# Patient Record
Sex: Male | Born: 1950 | Race: Black or African American | Hispanic: No | Smoking: Current every day smoker
Health system: Southern US, Community
[De-identification: ages and names within clinical notes are randomized; demographics above are authoritative.]

## PROBLEM LIST (undated history)

## (undated) DIAGNOSIS — E119 Type 2 diabetes mellitus without complications: Secondary | ICD-10-CM

## (undated) SURGERY — Surgical Case
Anesthesia: *Unknown

---

## 2003-12-12 ENCOUNTER — Ambulatory Visit (HOSPITAL_COMMUNITY): Admission: RE | Admit: 2003-12-12 | Discharge: 2003-12-12 | Payer: Self-pay | Admitting: Internal Medicine

## 2003-12-25 ENCOUNTER — Ambulatory Visit: Payer: Self-pay | Admitting: Internal Medicine

## 2003-12-25 ENCOUNTER — Ambulatory Visit: Payer: Self-pay | Admitting: *Deleted

## 2007-03-20 ENCOUNTER — Emergency Department (HOSPITAL_COMMUNITY): Admission: EM | Admit: 2007-03-20 | Discharge: 2007-03-20 | Payer: Self-pay | Admitting: Emergency Medicine

## 2008-04-06 IMAGING — CR DG CHEST 2V
2 series · 2 of 2 positions shown · non-contrast
Comparison: none

CLINICAL DATA: Preoperative respiratory exam for hand surgery.  Smoking history.
 CHEST - 2 VIEWS:
 No comparison.

[view not recorded (1 of 2)]
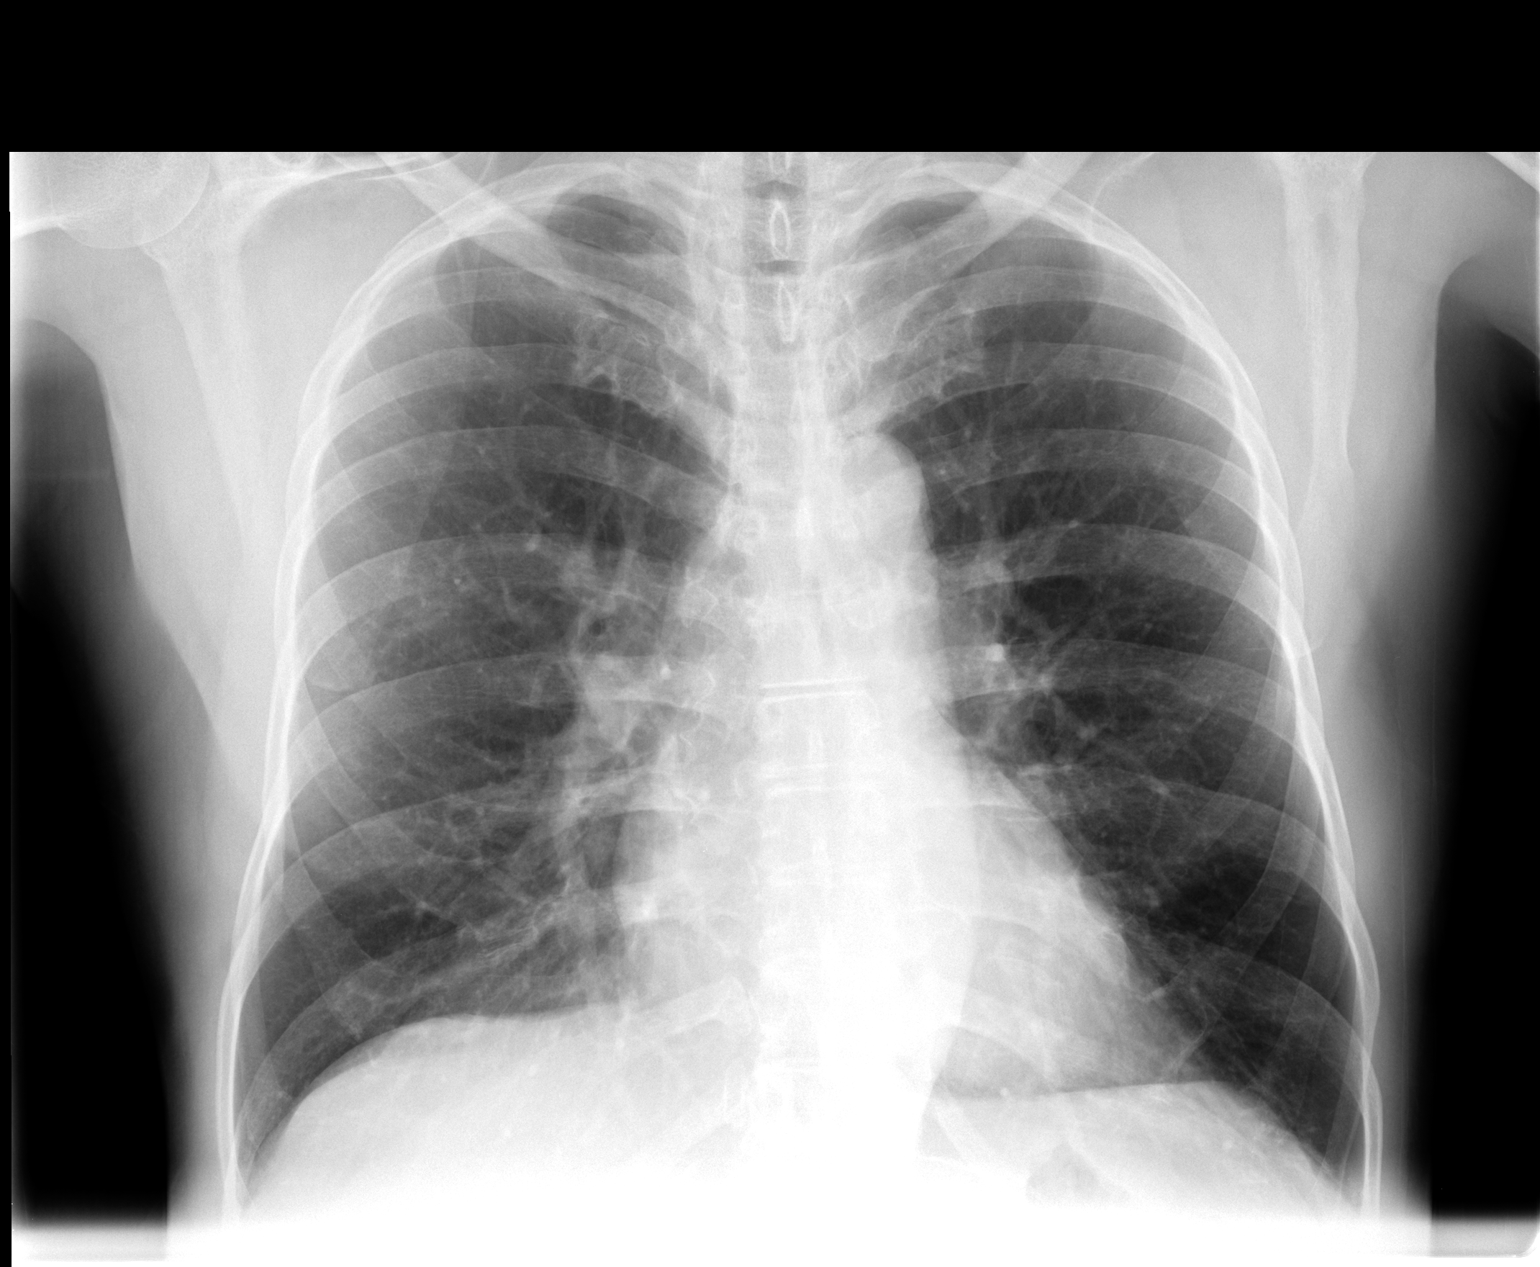

[view not recorded (2 of 2)]
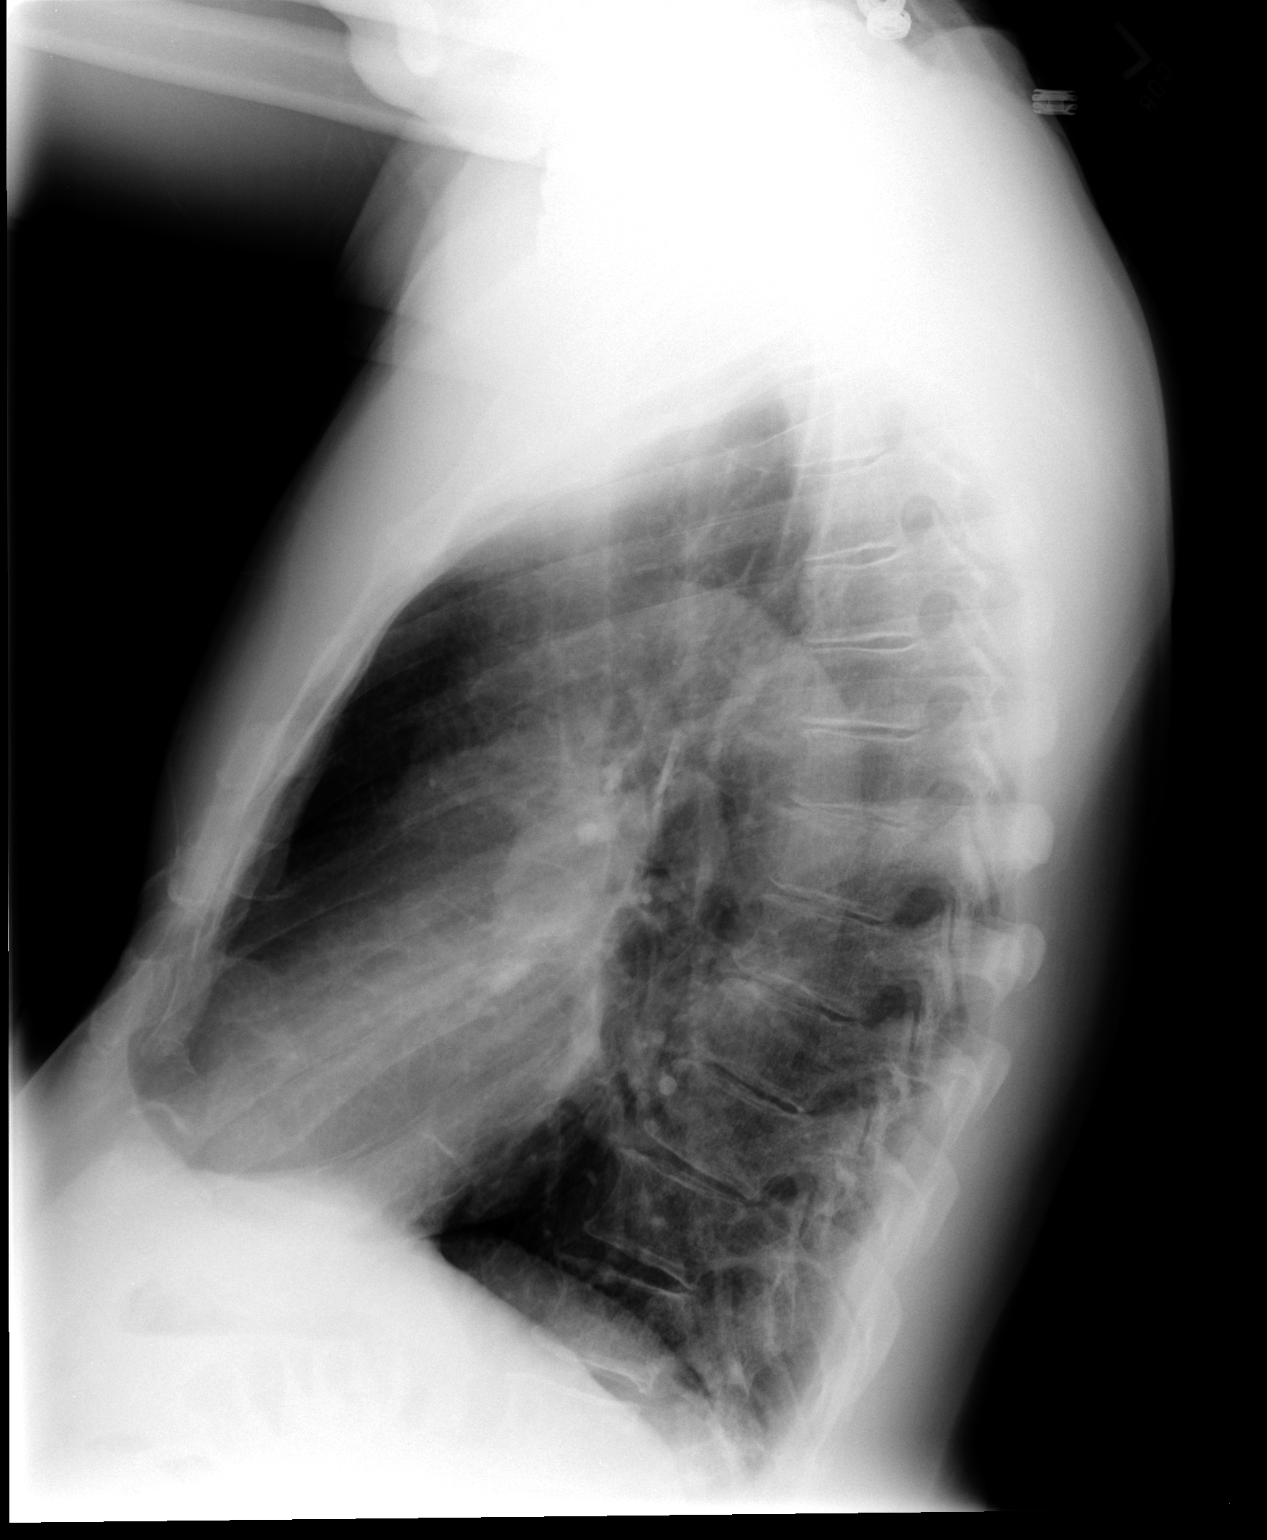

[2 of 2 positions shown; findings below may reference images not displayed]

FINDINGS: The heart size is normal.   The mediastinum is unremarkable.   The lungs show some slightly increased interstitial markings but no evidence of focal mass, infiltrate, collapse, or effusion.  Ordinary degenerative changes affect the spine.
IMPRESSION: No active disease.

## 2009-11-27 ENCOUNTER — Ambulatory Visit (HOSPITAL_COMMUNITY): Admission: RE | Admit: 2009-11-27 | Discharge: 2009-11-27 | Payer: Self-pay | Admitting: Cardiology

## 2010-09-01 NOTE — Consult Note (Signed)
Kevin Velez, Kevin Velez               ACCOUNT NO.:  [id number]   MEDICAL RECORD NO.:  [id number]          PATIENT TYPE:  INP   LOCATION:  1843                         FACILITY:  [facility]   PHYSICIAN:  Artist Pais. Mina Marble, M.D.DATE OF BIRTH:  11-Mar-1951   DATE OF CONSULTATION:  03/20/2007  DATE OF DISCHARGE:  03/20/2007                                 CONSULTATION   REFERRING PHYSICIAN:  Hilario Quarry, M.D.   REASON FOR CONSULTATION:  Kevin Velez is a 60 year old left-hand dominant  male, otherwise fairly healthy, who sustained a crush injury to his left  hand with obvious crush injuries to the long and small finger, dominant  left hand.  He is 56.  He has no known drug allergies.  He is not  diabetic.  He denies any current medical problems.  Denies any current  drug allergies or medications.  He does not smoke or drink excessively.   PHYSICAL EXAMINATION:  Examination reveals a well-nourished male,  pleasant, alert and oriented x3.  He has a significant crush injury to  the long ring finger with knee amputations.  He has gross contamination.  He has decreased sensation to the tips and has x-rays that show distal  phalangeal fractures.  He was transferred from an outside facility.   IMPRESSION:  A 60 year old male with significant crush injury to his  left long and left small fingers.  At this point in time we discussed  with him that we will take him to the operating room for irrigation and  debridement and repair as necessary.  He also understands that due to  the severe crush nature he may end up with amputations at one point in  the future if his soft tissue does not survive.  At this point in time  we will do our best to repair them and  maintain as much length as  possible. We will do this as soon as possible.      Artist Pais Mina Marble, M.D.  Electronically Signed     MAW/MEDQ  D:  03/20/2007  T:  03/21/2007  Job:  [id number]

## 2010-09-01 NOTE — Op Note (Signed)
Kevin Velez, Kevin Velez               ACCOUNT NO.:  [id number]   MEDICAL RECORD NO.:  [id number]          PATIENT TYPE:  INP   LOCATION:  1843                         FACILITY:  [facility]   PHYSICIAN:  Artist Pais. Weingold, M.D.DATE OF BIRTH:  11-12-1950   DATE OF PROCEDURE:  03/20/2007  DATE OF DISCHARGE:                               OPERATIVE REPORT   PREOPERATIVE DIAGNOSIS:  Severe crush injury left hand with open  injuries to the left long and left small finger.   POSTOPERATIVE DIAGNOSIS:  Severe crush injury left hand with open  injuries to the left long and left small finger.   PROCEDURE:  Irrigation debridement of above with operative treatment of  open distal phalangeal fracture and nailbed lacerations, left long and  left small.   SURGEON:  Artist Pais. Mina Marble, M.D.   ASSISTANT:  None.   ANESTHESIA:  General.   TOURNIQUET TIME:  40 minutes.   No complication.  No drains.   OPERATIVE REPORT:  The patient was taken to operating suite. After  induction of adequate general anesthesia, left upper extremity was  prepped and draped in sterile fashion.  An Esmarch was used to  exsanguinate the limb.  Tourniquet was inflated to 250 mm.  At this  point in time the left hand was irrigated and debrided.  There was gross  contamination of the open injury to the long and small finger.  A  significant amount of nonviable material and blood were removed.  After  this was done, the soft tissues were carefully realigned using 4-0  Vicryl Rapide.  There was significant crush to the tips of the long and  small finger.  Vicryl Rapide was used to carefully try and reapproximate  the soft tissues to the distal phalanx.  After this was done, distal  phalanx was reduced on both the fingers, the nailbed was repaired with 6-  0 chromic followed by placement of the nail plate back under the  eponychial fold in both situations.  Intraoperative fluoroscopy revealed  reduction of the fractures.  Due  to the questionable soft tissue  viability, decision was made not to use K-wires across the fracture  sites as the fractures were stable.  At this point time the fingers  dressed with Xeroform, 4x4s and volar splint.  The patient tolerated  both procedures well, went recovery in stable fashion.      Artist Pais Mina Marble, M.D.  Electronically Signed    MAW/MEDQ  D:  03/20/2007  T:  03/21/2007  Job:  [id number]

## 2011-01-25 LAB — I-STAT 8, (EC8 V) (CONVERTED LAB)
Acid-Base Excess: 5 — ABNORMAL HIGH
Bicarbonate: 29.3 — ABNORMAL HIGH
Chloride: 106
HCT: 50
Hemoglobin: 17
pCO2, Ven: 42 — ABNORMAL LOW

## 2019-11-13 ENCOUNTER — Ambulatory Visit (HOSPITAL_COMMUNITY)
Admission: EM | Admit: 2019-11-13 | Discharge: 2019-11-13 | Disposition: A | Discharge status: Home / Self Care | Payer: Medicare Other | Attending: Family Medicine | Admitting: Family Medicine

## 2019-11-13 ENCOUNTER — Other Ambulatory Visit: Payer: Self-pay

## 2019-11-13 ENCOUNTER — Encounter (HOSPITAL_COMMUNITY): Payer: Self-pay

## 2019-11-13 DIAGNOSIS — B029 Zoster without complications: Secondary | ICD-10-CM

## 2019-11-13 MED ORDER — CAPSAICIN 0.075 % EX CREA: 1.0000 "application " | TOPICAL_CREAM | Freq: Two times a day (BID) | CUTANEOUS | 0 refills | Status: DC | PRN

## 2019-11-13 MED ORDER — VALACYCLOVIR HCL 1 G PO TABS: 1000.0000 mg | ORAL_TABLET | Freq: Three times a day (TID) | ORAL | 0 refills | Status: DC

## 2019-11-13 MED ORDER — HYDROCODONE-ACETAMINOPHEN 5-325 MG PO TABS: 1.0000 | ORAL_TABLET | Freq: Four times a day (QID) | ORAL | 0 refills | Status: DC | PRN

## 2019-11-13 NOTE — ED Triage Notes (Signed)
Pt presents to UC for possible shingles. Pt noted to have rash on left ribcage. Pt states rash is painful and burns. Pt noted to have open sores on rash. Pt has been treating with hydrocortisone cream with out relief. Of note pt got shingles vaccine on Friday, rash came on Wednesday.

## 2019-11-13 NOTE — ED Provider Notes (Signed)
[facility]   [id number] 11/13/19 Arrival Time: 5726  ASSESSMENT & PLAN:  1. Herpes zoster without complication     Meds ordered this encounter  Medications   valACYclovir (VALTREX) 1000 MG tablet    Sig: Take 1 tablet (1,000 mg total) by mouth 3 (three) times daily.    Dispense:  21 tablet    Refill:  0   capsicum (ZOSTRIX) 0.075 % topical cream    Sig: Apply 1 application topically 2 (two) times daily as needed.    Dispense:  28.3 g    Refill:  0   HYDROcodone-acetaminophen (NORCO/VICODIN) 5-325 MG tablet    Sig: Take 1 tablet by mouth every 6 (six) hours as needed for moderate pain or severe pain.    Dispense:  10 tablet    Refill:  0    No signs of infection.  [place] Controlled Substances Registry consulted for this patient. I feel the risk/benefit ratio today is favorable for proceeding with this prescription for a controlled substance. Medication sedation precautions given.   Will follow up with PCP or here if worsening or failing to improve as anticipated. Reviewed expectations re: course of current medical issues. Questions answered. Outlined signs and symptoms indicating need for more acute intervention. Patient verbalized understanding. After Visit Summary given.   SUBJECTIVE:  Kevin Velez is a 69 y.o. male who presents with a skin complaint. Questions shingles. L chest; several days. Painful; affecting sleep. Afebrile. OTC tx without relief. Has received first shingles vaccine one week prior to current rash.   OBJECTIVE: Vitals:   11/13/19 0856  BP: (!) 142/88  Pulse: 89  Resp: 16  Temp: 98.1 F (36.7 C)  TempSrc: Oral  SpO2: 97%    General appearance: alert; no distress HEENT: [place]; AT Neck: supple with FROM Lungs: clear to auscultation bilaterally Heart: regular rate and rhythm Extremities: no edema; moves all extremities normally Skin: warm and dry; crops of red/purplish papules over L side and chest wall; some  crusting Psychological: alert and cooperative; normal mood and affect  Not on File  History reviewed. No pertinent past medical history. Social History   Socioeconomic History   Marital status: Divorced    Spouse name: Not on file   Number of children: Not on file   Years of education: Not on file   Highest education level: Not on file  Occupational History   Not on file  Tobacco Use   Smoking status: Current Every Day Smoker   Smokeless tobacco: Never Used  Substance and Sexual Activity   Alcohol use: Not on file   Drug use: Not on file   Sexual activity: Not on file  Other Topics Concern   Not on file  Social History Narrative   Not on file   Social Determinants of Health   Financial Resource Strain:    Difficulty of Paying Living Expenses:   Food Insecurity:    Worried About Running Out of Food in the Last Year:    Barista in the Last Year:   Transportation Needs:    Freight forwarder (Medical):    Lack of Transportation (Non-Medical):   Physical Activity:    Days of Exercise per Week:    Minutes of Exercise per Session:   Stress:    Feeling of Stress :   Social Connections:    Frequency of Communication with Friends and Family:    Frequency of Social Gatherings with Friends and Family:  Attends Religious Services:    Active Member of Clubs or Organizations:    Attends Engineer, structural:    Marital Status:   Intimate Partner Violence:    Fear of Current or Ex-Partner:    Emotionally Abused:    Physically Abused:    Sexually Abused:    History reviewed. No pertinent family history. History reviewed. No pertinent surgical history.   Mardella Layman, MD 11/13/19 [phone]

## 2019-11-13 NOTE — Discharge Instructions (Addendum)

## 2022-11-30 ENCOUNTER — Other Ambulatory Visit: Payer: Self-pay | Admitting: Nurse Practitioner

## 2022-11-30 DIAGNOSIS — F1021 Alcohol dependence, in remission: Secondary | ICD-10-CM

## 2022-12-22 ENCOUNTER — Other Ambulatory Visit: Payer: 59

## 2023-01-11 ENCOUNTER — Other Ambulatory Visit: Payer: 59

## 2023-01-24 ENCOUNTER — Other Ambulatory Visit: Payer: 59

## 2023-08-08 ENCOUNTER — Inpatient Hospital Stay (HOSPITAL_COMMUNITY)

## 2023-08-08 ENCOUNTER — Inpatient Hospital Stay (HOSPITAL_COMMUNITY)
Admission: EM | Admit: 2023-08-08 | Discharge: 2023-08-20 | DRG: 871 | Disposition: A | Discharge status: Skilled Nursing Facility | Attending: Internal Medicine | Admitting: Internal Medicine

## 2023-08-08 ENCOUNTER — Emergency Department (HOSPITAL_COMMUNITY)

## 2023-08-08 ENCOUNTER — Other Ambulatory Visit: Payer: Self-pay

## 2023-08-08 DIAGNOSIS — D539 Nutritional anemia, unspecified: Secondary | ICD-10-CM | POA: Diagnosis present

## 2023-08-08 DIAGNOSIS — K922 Gastrointestinal hemorrhage, unspecified: Secondary | ICD-10-CM

## 2023-08-08 DIAGNOSIS — A419 Sepsis, unspecified organism: Secondary | ICD-10-CM | POA: Diagnosis present

## 2023-08-08 DIAGNOSIS — J9690 Respiratory failure, unspecified, unspecified whether with hypoxia or hypercapnia: Secondary | ICD-10-CM | POA: Diagnosis present

## 2023-08-08 DIAGNOSIS — I959 Hypotension, unspecified: Secondary | ICD-10-CM | POA: Diagnosis not present

## 2023-08-08 DIAGNOSIS — D684 Acquired coagulation factor deficiency: Secondary | ICD-10-CM | POA: Diagnosis present

## 2023-08-08 DIAGNOSIS — E860 Dehydration: Secondary | ICD-10-CM | POA: Diagnosis present

## 2023-08-08 DIAGNOSIS — K449 Diaphragmatic hernia without obstruction or gangrene: Secondary | ICD-10-CM | POA: Diagnosis present

## 2023-08-08 DIAGNOSIS — E874 Mixed disorder of acid-base balance: Secondary | ICD-10-CM | POA: Diagnosis present

## 2023-08-08 DIAGNOSIS — K7291 Hepatic failure, unspecified with coma: Secondary | ICD-10-CM | POA: Diagnosis not present

## 2023-08-08 DIAGNOSIS — K7211 Chronic hepatic failure with coma: Secondary | ICD-10-CM | POA: Diagnosis present

## 2023-08-08 DIAGNOSIS — R339 Retention of urine, unspecified: Secondary | ICD-10-CM | POA: Diagnosis not present

## 2023-08-08 DIAGNOSIS — G9341 Metabolic encephalopathy: Secondary | ICD-10-CM | POA: Diagnosis present

## 2023-08-08 DIAGNOSIS — D649 Anemia, unspecified: Secondary | ICD-10-CM

## 2023-08-08 DIAGNOSIS — Z1152 Encounter for screening for COVID-19: Secondary | ICD-10-CM

## 2023-08-08 DIAGNOSIS — K7460 Unspecified cirrhosis of liver: Secondary | ICD-10-CM | POA: Diagnosis not present

## 2023-08-08 DIAGNOSIS — I8510 Secondary esophageal varices without bleeding: Secondary | ICD-10-CM | POA: Diagnosis present

## 2023-08-08 DIAGNOSIS — K7200 Acute and subacute hepatic failure without coma: Secondary | ICD-10-CM | POA: Diagnosis not present

## 2023-08-08 DIAGNOSIS — N179 Acute kidney failure, unspecified: Secondary | ICD-10-CM | POA: Diagnosis not present

## 2023-08-08 DIAGNOSIS — K7011 Alcoholic hepatitis with ascites: Secondary | ICD-10-CM | POA: Diagnosis present

## 2023-08-08 DIAGNOSIS — Z79899 Other long term (current) drug therapy: Secondary | ICD-10-CM

## 2023-08-08 DIAGNOSIS — K7682 Hepatic encephalopathy: Secondary | ICD-10-CM

## 2023-08-08 DIAGNOSIS — K7201 Acute and subacute hepatic failure with coma: Secondary | ICD-10-CM | POA: Diagnosis not present

## 2023-08-08 DIAGNOSIS — K852 Alcohol induced acute pancreatitis without necrosis or infection: Secondary | ICD-10-CM | POA: Diagnosis present

## 2023-08-08 DIAGNOSIS — Z781 Physical restraint status: Secondary | ICD-10-CM

## 2023-08-08 DIAGNOSIS — R578 Other shock: Secondary | ICD-10-CM | POA: Diagnosis not present

## 2023-08-08 DIAGNOSIS — K921 Melena: Secondary | ICD-10-CM | POA: Diagnosis not present

## 2023-08-08 DIAGNOSIS — K7031 Alcoholic cirrhosis of liver with ascites: Secondary | ICD-10-CM | POA: Diagnosis present

## 2023-08-08 DIAGNOSIS — D6959 Other secondary thrombocytopenia: Secondary | ICD-10-CM | POA: Diagnosis present

## 2023-08-08 DIAGNOSIS — K7290 Hepatic failure, unspecified without coma: Secondary | ICD-10-CM | POA: Diagnosis not present

## 2023-08-08 DIAGNOSIS — R579 Shock, unspecified: Secondary | ICD-10-CM | POA: Diagnosis not present

## 2023-08-08 DIAGNOSIS — Z681 Body mass index (BMI) 19 or less, adult: Secondary | ICD-10-CM | POA: Diagnosis not present

## 2023-08-08 DIAGNOSIS — R571 Hypovolemic shock: Secondary | ICD-10-CM | POA: Diagnosis not present

## 2023-08-08 DIAGNOSIS — R1310 Dysphagia, unspecified: Secondary | ICD-10-CM | POA: Diagnosis present

## 2023-08-08 DIAGNOSIS — F17200 Nicotine dependence, unspecified, uncomplicated: Secondary | ICD-10-CM | POA: Diagnosis present

## 2023-08-08 DIAGNOSIS — K264 Chronic or unspecified duodenal ulcer with hemorrhage: Secondary | ICD-10-CM | POA: Diagnosis present

## 2023-08-08 DIAGNOSIS — K760 Fatty (change of) liver, not elsewhere classified: Secondary | ICD-10-CM | POA: Diagnosis present

## 2023-08-08 DIAGNOSIS — E876 Hypokalemia: Secondary | ICD-10-CM | POA: Diagnosis present

## 2023-08-08 DIAGNOSIS — J90 Pleural effusion, not elsewhere classified: Secondary | ICD-10-CM | POA: Diagnosis present

## 2023-08-08 DIAGNOSIS — D62 Acute posthemorrhagic anemia: Secondary | ICD-10-CM | POA: Diagnosis present

## 2023-08-08 DIAGNOSIS — T68XXXA Hypothermia, initial encounter: Secondary | ICD-10-CM

## 2023-08-08 DIAGNOSIS — R6521 Severe sepsis with septic shock: Secondary | ICD-10-CM | POA: Diagnosis present

## 2023-08-08 DIAGNOSIS — R188 Other ascites: Secondary | ICD-10-CM | POA: Diagnosis not present

## 2023-08-08 DIAGNOSIS — I9589 Other hypotension: Secondary | ICD-10-CM | POA: Diagnosis not present

## 2023-08-08 DIAGNOSIS — E43 Unspecified severe protein-calorie malnutrition: Secondary | ICD-10-CM | POA: Diagnosis present

## 2023-08-08 HISTORY — DX: Type 2 diabetes mellitus without complications: E11.9

## 2023-08-08 LAB — I-STAT CG4 LACTIC ACID, ED
Lactic Acid, Venous: 7.2 mmol/L (ref 0.5–1.9)
Lactic Acid, Venous: 6.7 mmol/L (ref 0.5–1.9)

## 2023-08-08 LAB — RETICULOCYTES
Immature Retic Fract: 37.7 % — ABNORMAL HIGH (ref 2.3–15.9)
RBC.: 2.64 MIL/uL — ABNORMAL LOW (ref 4.22–5.81)
Retic Count, Absolute: 480.5 10*3/uL — ABNORMAL HIGH (ref 19.0–186.0)
Retic Ct Pct: 18.2 % — ABNORMAL HIGH (ref 0.4–3.1)

## 2023-08-08 LAB — BLOOD GAS, ARTERIAL
Acid-base deficit: 2.4 mmol/L — ABNORMAL HIGH (ref 0.0–2.0)
Acid-base deficit: 2.1 mmol/L — ABNORMAL HIGH (ref 0.0–2.0)
Bicarbonate: 16.9 mmol/L — ABNORMAL LOW (ref 20.0–28.0)
Bicarbonate: 19.5 mmol/L — ABNORMAL LOW (ref 20.0–28.0)
Drawn by: 270211
Drawn by: 56037
FIO2: 80 %
MECHVT: 560 mL
MECHVT: 560 mL
O2 Content: 100 L/min
O2 Saturation: 100 %
O2 Saturation: 99.9 %
PEEP: 5 cmH2O
PEEP: 5 cmH2O
Patient temperature: 36.4
Patient temperature: 36.7
RATE: 20 {breaths}/min
RATE: 14 {breaths}/min
pCO2 arterial: 18 mmHg — CL (ref 32–48)
pCO2 arterial: 25 mmHg — ABNORMAL LOW (ref 32–48)
pH, Arterial: 7.59 — ABNORMAL HIGH (ref 7.35–7.45)
pH, Arterial: 7.5 — ABNORMAL HIGH (ref 7.35–7.45)
pO2, Arterial: 194 mmHg — ABNORMAL HIGH (ref 83–108)
pO2, Arterial: 305 mmHg — ABNORMAL HIGH (ref 83–108)

## 2023-08-08 LAB — COMPREHENSIVE METABOLIC PANEL WITH GFR
ALT: 63 U/L — ABNORMAL HIGH (ref 0–44)
AST: 236 U/L — ABNORMAL HIGH (ref 15–41)
Albumin: 1.8 g/dL — ABNORMAL LOW (ref 3.5–5.0)
Alkaline Phosphatase: 67 U/L (ref 38–126)
Anion gap: 20 — ABNORMAL HIGH (ref 5–15)
BUN: 145 mg/dL — ABNORMAL HIGH (ref 8–23)
CO2: 16 mmol/L — ABNORMAL LOW (ref 22–32)
Calcium: 8.4 mg/dL — ABNORMAL LOW (ref 8.9–10.3)
Chloride: 99 mmol/L (ref 98–111)
Creatinine, Ser: 2.68 mg/dL — ABNORMAL HIGH (ref 0.61–1.24)
GFR, Estimated: 25 mL/min — ABNORMAL LOW (ref >60)
Glucose, Bld: 123 mg/dL — ABNORMAL HIGH (ref 70–99)
Potassium: 3.9 mmol/L (ref 3.5–5.1)
Sodium: 135 mmol/L (ref 135–145)
Total Bilirubin: 15.8 mg/dL — ABNORMAL HIGH (ref 0.0–1.2)
Total Protein: 6.6 g/dL (ref 6.5–8.1)

## 2023-08-08 LAB — CBC WITH DIFFERENTIAL/PLATELET
Abs Immature Granulocytes: 0.35 10*3/uL — ABNORMAL HIGH (ref 0.00–0.07)
Abs Immature Granulocytes: 0.25 10*3/uL — ABNORMAL HIGH (ref 0.00–0.07)
Basophils Absolute: 0 10*3/uL (ref 0.0–0.1)
Basophils Absolute: 0 10*3/uL (ref 0.0–0.1)
Basophils Relative: 0 %
Basophils Relative: 0 %
Eosinophils Absolute: 0.1 10*3/uL (ref 0.0–0.5)
Eosinophils Absolute: 0 10*3/uL (ref 0.0–0.5)
Eosinophils Relative: 0 %
Eosinophils Relative: 0 %
HCT: 9.7 % — ABNORMAL LOW (ref 39.0–52.0)
HCT: 9.7 % — ABNORMAL LOW (ref 39.0–52.0)
Hemoglobin: 3.3 g/dL — CL (ref 13.0–17.0)
Hemoglobin: 3.4 g/dL — CL (ref 13.0–17.0)
Immature Granulocytes: 2 %
Immature Granulocytes: 2 %
Lymphocytes Relative: 17 %
Lymphocytes Relative: 14 %
Lymphs Abs: 2.7 10*3/uL (ref 0.7–4.0)
Lymphs Abs: 1.9 10*3/uL (ref 0.7–4.0)
MCH: 37.1 pg — ABNORMAL HIGH (ref 26.0–34.0)
MCH: 37.8 pg — ABNORMAL HIGH (ref 26.0–34.0)
MCHC: 34 g/dL (ref 30.0–36.0)
MCHC: 35.1 g/dL (ref 30.0–36.0)
MCV: 109 fL — ABNORMAL HIGH (ref 80.0–100.0)
MCV: 107.8 fL — ABNORMAL HIGH (ref 80.0–100.0)
Monocytes Absolute: 1.2 10*3/uL — ABNORMAL HIGH (ref 0.1–1.0)
Monocytes Absolute: 0.9 10*3/uL (ref 0.1–1.0)
Monocytes Relative: 7 %
Monocytes Relative: 6 %
Neutro Abs: 11.9 10*3/uL — ABNORMAL HIGH (ref 1.7–7.7)
Neutro Abs: 10.7 10*3/uL — ABNORMAL HIGH (ref 1.7–7.7)
Neutrophils Relative %: 74 %
Neutrophils Relative %: 78 %
Platelets: 211 10*3/uL (ref 150–400)
Platelets: 136 10*3/uL — ABNORMAL LOW (ref 150–400)
RBC: 0.89 MIL/uL — ABNORMAL LOW (ref 4.22–5.81)
RBC: 0.9 MIL/uL — ABNORMAL LOW (ref 4.22–5.81)
RDW: 18.9 % — ABNORMAL HIGH (ref 11.5–15.5)
RDW: 18.3 % — ABNORMAL HIGH (ref 11.5–15.5)
WBC: 16.2 10*3/uL — ABNORMAL HIGH (ref 4.0–10.5)
WBC: 13.7 10*3/uL — ABNORMAL HIGH (ref 4.0–10.5)
nRBC: 0.9 % — ABNORMAL HIGH (ref 0.0–0.2)
nRBC: 0.7 % — ABNORMAL HIGH (ref 0.0–0.2)

## 2023-08-08 LAB — URINALYSIS, W/ REFLEX TO CULTURE (INFECTION SUSPECTED)
Glucose, UA: NEGATIVE mg/dL
Hgb urine dipstick: NEGATIVE
Ketones, ur: NEGATIVE mg/dL
Leukocytes,Ua: NEGATIVE
Nitrite: NEGATIVE
Protein, ur: NEGATIVE mg/dL
Specific Gravity, Urine: 1.015 (ref 1.005–1.030)
pH: 5 (ref 5.0–8.0)

## 2023-08-08 LAB — FOLATE: Folate: 4 ng/mL — ABNORMAL LOW (ref >5.9)

## 2023-08-08 LAB — URINALYSIS, ROUTINE W REFLEX MICROSCOPIC
Glucose, UA: NEGATIVE mg/dL
Ketones, ur: NEGATIVE mg/dL
Leukocytes,Ua: NEGATIVE
Nitrite: NEGATIVE
Protein, ur: NEGATIVE mg/dL
Specific Gravity, Urine: 1.014 (ref 1.005–1.030)
pH: 5 (ref 5.0–8.0)

## 2023-08-08 LAB — IRON AND TIBC: Iron: 80 ug/dL (ref 45–182)

## 2023-08-08 LAB — HEPATITIS PANEL, ACUTE
HCV Ab: NONREACTIVE
Hep A IgM: NONREACTIVE
Hep B C IgM: NONREACTIVE
Hepatitis B Surface Ag: NONREACTIVE

## 2023-08-08 LAB — RESP PANEL BY RT-PCR (RSV, FLU A&B, COVID)  RVPGX2
Influenza A by PCR: NEGATIVE
Influenza B by PCR: NEGATIVE
Resp Syncytial Virus by PCR: NEGATIVE
SARS Coronavirus 2 by RT PCR: NEGATIVE

## 2023-08-08 LAB — FERRITIN: Ferritin: 3526 ng/mL — ABNORMAL HIGH (ref 24–336)

## 2023-08-08 LAB — PREPARE RBC (CROSSMATCH)

## 2023-08-08 LAB — PROTIME-INR
INR: 3 — ABNORMAL HIGH (ref 0.8–1.2)
Prothrombin Time: 31.5 s — ABNORMAL HIGH (ref 11.4–15.2)

## 2023-08-08 LAB — VITAMIN B12: Vitamin B-12: 3700 pg/mL — ABNORMAL HIGH (ref 180–914)

## 2023-08-08 LAB — CK: Total CK: 14 U/L — ABNORMAL LOW (ref 49–397)

## 2023-08-08 LAB — AMMONIA: Ammonia: 122 umol/L — ABNORMAL HIGH (ref 9–35)

## 2023-08-08 LAB — ETHANOL: Alcohol, Ethyl (B): <10 mg/dL (ref <10)

## 2023-08-08 LAB — ABO/RH: ABO/RH(D): O POS

## 2023-08-08 LAB — MRSA NEXT GEN BY PCR, NASAL: MRSA by PCR Next Gen: NOT DETECTED

## 2023-08-08 LAB — HEPATITIS B SURFACE ANTIBODY,QUALITATIVE: Hep B S Ab: NONREACTIVE

## 2023-08-08 LAB — HEPATITIS A ANTIBODY, TOTAL: hep A Total Ab: REACTIVE — AB

## 2023-08-08 LAB — POC OCCULT BLOOD, ED: Fecal Occult Bld: POSITIVE — AB

## 2023-08-08 LAB — ACETAMINOPHEN LEVEL: Acetaminophen (Tylenol), Serum: <10 ug/mL — ABNORMAL LOW (ref 10–30)

## 2023-08-08 LAB — HEPATITIS B SURFACE ANTIGEN: Hepatitis B Surface Ag: NONREACTIVE

## 2023-08-08 LAB — HEPATITIS C ANTIBODY: HCV Ab: NONREACTIVE

## 2023-08-08 MED ORDER — FENTANYL CITRATE PF 50 MCG/ML IJ SOSY
25.0000 ug | PREFILLED_SYRINGE | INTRAMUSCULAR | Status: AC | PRN
  Administered 2023-08-08 (×3): 25 ug via INTRAVENOUS
  Filled 2023-08-08 (×2): qty 1

## 2023-08-08 MED ORDER — PIPERACILLIN-TAZOBACTAM 3.375 G IVPB
3.3750 g | Freq: Two times a day (BID) | INTRAVENOUS | Status: DC
  Administered 2023-08-09: 3.375 g via INTRAVENOUS
  Filled 2023-08-08: qty 50

## 2023-08-08 MED ORDER — INSULIN ASPART 100 UNIT/ML IJ SOLN
0.0000 [IU] | INTRAMUSCULAR | Status: DC
Start: 2023-08-08 — End: 2023-08-11
  Administered 2023-08-11: 1 [IU] via SUBCUTANEOUS
  Filled 2023-08-08: qty 0.06

## 2023-08-08 MED ORDER — FAMOTIDINE 20 MG PO TABS
20.0000 mg | ORAL_TABLET | Freq: Every day | ORAL | Status: DC
Start: 2023-08-08 — End: 2023-08-08

## 2023-08-08 MED ORDER — DEXTROSE 5 % IV SOLN
6.2500 mg/kg/h | INTRAVENOUS | Status: DC
  Filled 2023-08-08: qty 90

## 2023-08-08 MED ORDER — FENTANYL CITRATE PF 50 MCG/ML IJ SOSY
25.0000 ug | PREFILLED_SYRINGE | INTRAMUSCULAR | Status: DC | PRN
  Administered 2023-08-08: 100 ug via INTRAVENOUS
  Administered 2023-08-08: 25 ug via INTRAVENOUS
  Administered 2023-08-08: 50 ug via INTRAVENOUS
  Administered 2023-08-08: 25 ug via INTRAVENOUS
  Administered 2023-08-08 – 2023-08-09 (×3): 100 ug via INTRAVENOUS
  Administered 2023-08-09 (×4): 50 ug via INTRAVENOUS
  Administered 2023-08-09: 100 ug via INTRAVENOUS
  Administered 2023-08-09 (×3): 50 ug via INTRAVENOUS
  Administered 2023-08-10 (×2): 100 ug via INTRAVENOUS
  Filled 2023-08-08: qty 1
  Filled 2023-08-08: qty 2
  Filled 2023-08-08 (×2): qty 1
  Filled 2023-08-08 (×5): qty 2
  Filled 2023-08-08 (×3): qty 1
  Filled 2023-08-08: qty 2
  Filled 2023-08-08 (×3): qty 1
  Filled 2023-08-08: qty 2
  Filled 2023-08-08: qty 1

## 2023-08-08 MED ORDER — CEFEPIME HCL 2 G IV SOLR
2.0000 g | Freq: Once | INTRAVENOUS | Status: AC
  Administered 2023-08-08: 2 g via INTRAVENOUS
  Filled 2023-08-08: qty 12.5

## 2023-08-08 MED ORDER — DEXTROSE 5 % IV SOLN
6.2500 mg/kg/h | INTRAVENOUS | Status: DC
  Administered 2023-08-08: 6.25 mg/kg/h via INTRAVENOUS
  Filled 2023-08-08 (×2): qty 90

## 2023-08-08 MED ORDER — ETOMIDATE 2 MG/ML IV SOLN
INTRAVENOUS | Status: AC
  Filled 2023-08-08: qty 10

## 2023-08-08 MED ORDER — LACTULOSE 10 GM/15ML PO SOLN
30.0000 g | Freq: Once | ORAL | Status: AC
  Administered 2023-08-09: 30 g
  Filled 2023-08-08: qty 60

## 2023-08-08 MED ORDER — POLYETHYLENE GLYCOL 3350 17 G PO PACK: 17.0000 g | PACK | Freq: Every day | ORAL | Status: DC | PRN

## 2023-08-08 MED ORDER — LACTATED RINGERS IV SOLN: INTRAVENOUS | Status: AC

## 2023-08-08 MED ORDER — NOREPINEPHRINE 4 MG/250ML-% IV SOLN
INTRAVENOUS | Status: AC
  Administered 2023-08-09: 11 ug/min via INTRAVENOUS
  Filled 2023-08-08: qty 250

## 2023-08-08 MED ORDER — CHLORHEXIDINE GLUCONATE CLOTH 2 % EX PADS
6.0000 | MEDICATED_PAD | Freq: Every day | CUTANEOUS | Status: DC
  Administered 2023-08-08 – 2023-08-11 (×4): 6 via TOPICAL

## 2023-08-08 MED ORDER — DOCUSATE SODIUM 100 MG PO CAPS: 100.0000 mg | ORAL_CAPSULE | Freq: Two times a day (BID) | ORAL | Status: DC | PRN

## 2023-08-08 MED ORDER — DEXTROSE 5 % IV SOLN
12.5000 mg/kg/h | INTRAVENOUS | Status: DC
  Filled 2023-08-08: qty 90

## 2023-08-08 MED ORDER — VANCOMYCIN HCL IN DEXTROSE 1-5 GM/200ML-% IV SOLN
1000.0000 mg | Freq: Once | INTRAVENOUS | Status: AC
  Administered 2023-08-08: 1000 mg via INTRAVENOUS
  Filled 2023-08-08: qty 200

## 2023-08-08 MED ORDER — DEXTROSE 5 % IV SOLN
12.5000 mg/kg/h | INTRAVENOUS | Status: DC
  Administered 2023-08-08: 12.5 mg/kg/h via INTRAVENOUS
  Filled 2023-08-08: qty 90

## 2023-08-08 MED ORDER — ACETYLCYSTEINE LOAD VIA INFUSION
150.0000 mg/kg | Freq: Once | INTRAVENOUS | Status: AC
  Administered 2023-08-08: 6120 mg via INTRAVENOUS
  Filled 2023-08-08: qty 201

## 2023-08-08 MED ORDER — VITAMIN K1 10 MG/ML IJ SOLN
10.0000 mg | Freq: Once | INTRAVENOUS | Status: AC
  Administered 2023-08-08: 10 mg via INTRAVENOUS
  Filled 2023-08-08: qty 1

## 2023-08-08 MED ORDER — IPRATROPIUM-ALBUTEROL 0.5-2.5 (3) MG/3ML IN SOLN: 3.0000 mL | Freq: Four times a day (QID) | RESPIRATORY_TRACT | Status: DC | PRN

## 2023-08-08 MED ORDER — SODIUM BICARBONATE 8.4 % IV SOLN
50.0000 meq | Freq: Once | INTRAVENOUS | Status: AC
  Administered 2023-08-08: 50 meq via INTRAVENOUS
  Filled 2023-08-08: qty 50

## 2023-08-08 MED ORDER — METRONIDAZOLE 500 MG/100ML IV SOLN
500.0000 mg | Freq: Once | INTRAVENOUS | Status: AC
  Administered 2023-08-08: 500 mg via INTRAVENOUS
  Filled 2023-08-08: qty 100

## 2023-08-08 MED ORDER — LACTATED RINGERS IV BOLUS: 1000.0000 mL | Freq: Once | INTRAVENOUS | Status: DC

## 2023-08-08 MED ORDER — ETOMIDATE 2 MG/ML IV SOLN
INTRAVENOUS | Status: AC
  Administered 2023-08-08: 2 mg
  Filled 2023-08-08: qty 10

## 2023-08-08 MED ORDER — LACTATED RINGERS IV BOLUS (SEPSIS)
2000.0000 mL | Freq: Once | INTRAVENOUS | Status: AC
  Administered 2023-08-08: 2000 mL via INTRAVENOUS

## 2023-08-08 MED ORDER — PANTOPRAZOLE SODIUM 40 MG IV SOLR
40.0000 mg | Freq: Two times a day (BID) | INTRAVENOUS | Status: DC
  Administered 2023-08-08 – 2023-08-20 (×24): 40 mg via INTRAVENOUS
  Filled 2023-08-08 (×24): qty 10

## 2023-08-08 MED ORDER — SODIUM CHLORIDE 0.9% IV SOLUTION: Freq: Once | INTRAVENOUS | Status: AC

## 2023-08-08 MED ORDER — PANTOPRAZOLE SODIUM 40 MG IV SOLR
40.0000 mg | Freq: Once | INTRAVENOUS | Status: AC
  Administered 2023-08-08: 40 mg via INTRAVENOUS
  Filled 2023-08-08: qty 10

## 2023-08-08 MED ORDER — NOREPINEPHRINE 4 MG/250ML-% IV SOLN
0.0000 ug/min | INTRAVENOUS | Status: DC
  Administered 2023-08-08: 2 ug/min via INTRAVENOUS
  Administered 2023-08-09: 11 ug/min via INTRAVENOUS
  Administered 2023-08-09: 6 ug/min via INTRAVENOUS
  Administered 2023-08-10: 8 ug/min via INTRAVENOUS
  Administered 2023-08-10: 6 ug/min via INTRAVENOUS
  Administered 2023-08-11: 3 ug/min via INTRAVENOUS
  Filled 2023-08-08 (×6): qty 250

## 2023-08-08 MED ORDER — SODIUM CHLORIDE 0.9 % IV BOLUS
1000.0000 mL | Freq: Once | INTRAVENOUS | Status: AC
  Administered 2023-08-08: 1000 mL via INTRAVENOUS

## 2023-08-08 MED ORDER — LACTULOSE 10 GM/15ML PO SOLN
30.0000 g | Freq: Three times a day (TID) | ORAL | Status: DC
  Administered 2023-08-10 – 2023-08-16 (×10): 30 g via ORAL
  Filled 2023-08-08 (×13): qty 45

## 2023-08-08 MED ORDER — SUCCINYLCHOLINE CHLORIDE 200 MG/10ML IV SOSY
PREFILLED_SYRINGE | INTRAVENOUS | Status: AC
  Administered 2023-08-08: 100 mg
  Filled 2023-08-08: qty 10

## 2023-08-08 NOTE — ED Provider Notes (Addendum)
 [place] EMERGENCY DEPARTMENT AT [facility] Provider Note   CSN: [id number] Arrival date & time: 08/08/23  1317     History  Chief Complaint  Patient presents with   Altered Mental Status   Emesis    Kevin Velez is a 73 y.o. male.  Pt is a 73 yo male with no significant pmhx.  Pt has been living at a rooming house.  Someone there called EMS because pt has not been out of bed in several days.  He has not been eating/drinking and has been stooling/urinating on himself.  Pt's bp 70/40 for EMS, but has improved upon arrival here.  Pt is unable to give any hx.         Home Medications Prior to Admission medications   Medication Sig Start Date End Date Taking? Authorizing Provider  capsicum (ZOSTRIX) 0.075 % topical cream Apply 1 application topically 2 (two) times daily as needed. 11/13/19   Afton Albright, MD  HYDROcodone -acetaminophen  (NORCO/VICODIN) 5-325 MG tablet Take 1 tablet by mouth every 6 (six) hours as needed for moderate pain or severe pain. 11/13/19   Afton Albright, MD  valACYclovir  (VALTREX ) 1000 MG tablet Take 1 tablet (1,000 mg total) by mouth 3 (three) times daily. 11/13/19   Afton Albright, MD      Allergies    Patient has no allergy information on record.    Review of Systems   Review of Systems  Unable to perform ROS: Mental status change  All other systems reviewed and are negative.   Physical Exam Updated Vital Signs BP 109/67   Pulse 89   Temp (!) 95.6 F (35.3 C) (Rectal)   Resp (!) 23   Ht 5\' 9"  (1.753 m)   Wt 40.8 kg   SpO2 100%   BMI 13.29 kg/m  Physical Exam Vitals and nursing note reviewed. Exam conducted with a chaperone present.  Constitutional:      Appearance: He is underweight. He is ill-appearing.  HENT:     Head: Normocephalic and atraumatic.     Right Ear: External ear normal.     Left Ear: External ear normal.     Nose: Nose normal.     Mouth/Throat:     Mouth: Mucous membranes are dry.  Eyes:     General:  Scleral icterus present.     Extraocular Movements: Extraocular movements intact.     Pupils: Pupils are equal, round, and reactive to light.  Cardiovascular:     Rate and Rhythm: Normal rate and regular rhythm.     Pulses: Normal pulses.     Heart sounds: Normal heart sounds.  Pulmonary:     Effort: Pulmonary effort is normal.     Breath sounds: Normal breath sounds.  Abdominal:     General: Bowel sounds are normal.     Palpations: Abdomen is soft. There is fluid wave.  Genitourinary:    Rectum: Guaiac result positive.     Comments: Stool is black Musculoskeletal:        General: Normal range of motion.     Cervical back: Normal range of motion and neck supple.  Skin:    General: Skin is warm.     Capillary Refill: Capillary refill takes less than 2 seconds.  Neurological:     Mental Status: He is alert.     Comments: Pt is moving all 4 extremities, he is not oriented      ED Results / Procedures / Treatments   Labs (  all labs ordered are listed, but only abnormal results are displayed) Labs Reviewed  COMPREHENSIVE METABOLIC PANEL WITH GFR - Abnormal; Notable for the following components:      Result Value   CO2 16 (*)    Glucose, Bld 123 (*)    BUN 145 (*)    Creatinine, Ser 2.68 (*)    Calcium  8.4 (*)    Albumin  1.8 (*)    AST 236 (*)    ALT 63 (*)    Total Bilirubin 15.8 (*)    GFR, Estimated 25 (*)    Anion gap 20 (*)    All other components within normal limits  CBC WITH DIFFERENTIAL/PLATELET - Abnormal; Notable for the following components:   WBC 16.2 (*)    RBC 0.89 (*)    Hemoglobin 3.3 (*)    HCT 9.7 (*)    MCV 109.0 (*)    MCH 37.1 (*)    RDW 18.9 (*)    nRBC 0.9 (*)    Neutro Abs 11.9 (*)    Monocytes Absolute 1.2 (*)    Abs Immature Granulocytes 0.35 (*)    All other components within normal limits  PROTIME-INR - Abnormal; Notable for the following components:   Prothrombin Time 31.5 (*)    INR 3.0 (*)    All other components within normal  limits  URINALYSIS, W/ REFLEX TO CULTURE (INFECTION SUSPECTED) - Abnormal; Notable for the following components:   Color, Urine AMBER (*)    Bilirubin Urine SMALL (*)    Bacteria, UA RARE (*)    All other components within normal limits  AMMONIA - Abnormal; Notable for the following components:   Ammonia 122 (*)    All other components within normal limits  I-STAT CG4 LACTIC ACID, ED - Abnormal; Notable for the following components:   Lactic Acid, Venous 7.2 (*)    All other components within normal limits  POC OCCULT BLOOD, ED - Abnormal; Notable for the following components:   Fecal Occult Bld POSITIVE (*)    All other components within normal limits  RESP PANEL BY RT-PCR (RSV, FLU A&B, COVID)  RVPGX2  CULTURE, BLOOD (ROUTINE X 2)  CULTURE, BLOOD (ROUTINE X 2)  VITAMIN B12  FOLATE  IRON AND TIBC  FERRITIN  RETICULOCYTES  HEPATITIS PANEL, ACUTE  BLOOD GAS, ARTERIAL  CBC WITH DIFFERENTIAL/PLATELET  RETICULOCYTES  ACETAMINOPHEN  LEVEL  ETHANOL  I-STAT CG4 LACTIC ACID, ED  PREPARE RBC (CROSSMATCH)    EKG None  Radiology No results found.  Procedures Procedure Name: Intubation Date/Time: 08/08/2023 3:57 PM  Performed by: Sueellen Emery, MDPreoxygenation: Pre-oxygenation with 100% oxygen Induction Type: Rapid sequence Ventilation: Mask ventilation without difficulty Laryngoscope Size: Glidescope and 3 Tube size: 7.5 mm Number of attempts: 1 Placement Confirmation: ETT inserted through vocal cords under direct vision, Breath sounds checked- equal and bilateral and Positive ETCO2    Central Line  Date/Time: 08/08/2023 3:58 PM  Performed by: Sueellen Emery, MD Authorized by: Sueellen Emery, MD   Consent:    Consent obtained:  Emergent situation   Alternatives discussed:  No treatment Universal protocol:    Patient identity confirmed:  Arm band Pre-procedure details:    Indication(s): central venous access and insufficient peripheral access     Hand hygiene:  Hand hygiene performed prior to insertion     Sterile barrier technique: All elements of maximal sterile technique followed     Skin preparation:  Chlorhexidine    Skin preparation agent: Skin preparation agent completely dried prior  to procedure   Sedation:    Sedation type:  None Anesthesia:    Anesthesia method:  Local infiltration   Local anesthetic:  Lidocaine 1% w/o epi Procedure details:    Location:  R femoral   Patient position:  Supine   Procedural supplies:  Triple lumen   Catheter size:  7 Fr   Landmarks identified: yes     Ultrasound guidance: no     Number of attempts:  1   Successful placement: yes   Post-procedure details:    Post-procedure:  Dressing applied and line sutured   Assessment:  Blood return through all ports and free fluid flow   Procedure completion:  Tolerated well, no immediate complications     Medications Ordered in ED Medications  lactated ringers  infusion (has no administration in time range)  ceFEPIme  (MAXIPIME ) 2 g in sodium chloride  0.9 % 100 mL IVPB (2 g Intravenous New Bag/Given 08/08/23 1558)  metroNIDAZOLE  (FLAGYL ) IVPB 500 mg (has no administration in time range)  vancomycin  (VANCOCIN ) IVPB 1000 mg/200 mL premix (has no administration in time range)  succinylcholine  (ANECTINE ) 200 MG/10ML syringe (has no administration in time range)  etomidate  (AMIDATE ) 2 MG/ML injection (has no administration in time range)  etomidate  (AMIDATE ) 2 MG/ML injection (has no administration in time range)  0.9 %  sodium chloride  infusion (Manually program via Guardrails IV Fluids) (has no administration in time range)  fentaNYL  (SUBLIMAZE ) injection 25 mcg (25 mcg Intravenous Given 08/08/23 1559)  fentaNYL  (SUBLIMAZE ) injection 25-100 mcg (has no administration in time range)  pantoprazole  (PROTONIX ) injection 40 mg (has no administration in time range)  lactulose  (CHRONULAC ) 10 GM/15ML solution 30 g (has no administration in time range)  sodium bicarbonate   injection 50 mEq (has no administration in time range)  lactated ringers  bolus 2,000 mL (2,000 mLs Intravenous New Bag/Given 08/08/23 1508)    ED Course/ Medical Decision Making/ A&P                                 Medical Decision Making Amount and/or Complexity of Data Reviewed Labs: ordered. Radiology: ordered.  Risk Prescription drug management. Decision regarding hospitalization.   This patient presents to the ED for concern of ams, this involves an extensive number of treatment options, and is a complaint that carries with it a high risk of complications and morbidity.  The differential diagnosis includes sepsis, electrolyte abn, hepatic enceph, anemia   Co morbidities that complicate the patient evaluation  none   Additional history obtained:  Additional history obtained from epic chart review External records from outside source obtained and reviewed including EMS report   Lab Tests:  I Ordered, and personally interpreted labs.  The pertinent results include:  ua with small bili, inr elevated at 3.0; cbc with wbc elevated at 16.2, hgb low at 3.3; cmp with CO2 low at 16, bun elevated at 145 and cr elevated at 2.68, alb low at 1.8, ast elevated at 236, alt elevated at 63, tb 15.8   Imaging Studies ordered:  I ordered imaging studies including cxr  Pending at shift change   Cardiac Monitoring:  The patient was maintained on a cardiac monitor.  I personally viewed and interpreted the cardiac monitored which showed an underlying rhythm of: nsr   Medicines ordered and prescription drug management:  I ordered medication including ivfs/abx/lactulose   for sx  Reevaluation of the patient after these medicines showed that the patient  improved I have reviewed the patients home medicines and have made adjustments as needed   Test Considered:  ct   Critical Interventions:  Intubation/central line/ivfs   Consultations Obtained:  I requested consultation with  the gastroenterologist (Dr. Lavaughn Portland),  and discussed lab and imaging findings as well as pertinent plan - he will see pt in consult Pt d/w CCM (Dr. Marygrace Snellen) who request we speak to the transplant center to see if they want him for possible transplant. Pt d/w [facility] transfer center and they don't have any beds and can't take pt for waiting list.   Problem List / ED Course:  Sepsis:  code sepsis called due to hypothermia and elevated lactic acid.  Ivfs and iv abx given. Hypothermia:  warming blanket applied Liver failure with hepatic encephalopathy:  unclear how acute or etiology.  Hepatitis panel sent.  Lactulose  ordered per ng. Severe anemia with GIB:  blood ordered for transfusion.  Protonix  ordered.  Gi consulted Aki:  likely dehydration.  Ivfs given Resp failure:  mental status deteriorated and he was no longer handling secretions.  Pt intubated w/o difficulty.    Reevaluation:  After the interventions noted above, I reevaluated the patient and found that they have :worsened   Social Determinants of Health:  Lives in a rooming house   Dispostion:  After consideration of the diagnostic results and the patients response to treatment, I feel that the patent would benefit from admission.  CRITICAL CARE Performed by: Sueellen Emery   Total critical care time: 60 minutes  Critical care time was exclusive of separately billable procedures and treating other patients.  Critical care was necessary to treat or prevent imminent or life-threatening deterioration.  Critical care was time spent personally by me on the following activities: development of treatment plan with patient and/or surrogate as well as nursing, discussions with consultants, evaluation of patient's response to treatment, examination of patient, obtaining history from patient or surrogate, ordering and performing treatments and interventions, ordering and review of laboratory studies, ordering and review of radiographic  studies, pulse oximetry and re-evaluation of patient's condition.           Final Clinical Impression(s) / ED Diagnoses Final diagnoses:  Liver failure with hepatic coma, unspecified chronicity (HCC)  Hepatic encephalopathy (HCC)  Gastrointestinal hemorrhage, unspecified gastrointestinal hemorrhage type  Symptomatic anemia  Dehydration  AKI (acute kidney injury) (HCC)  Hypothermia, initial encounter    Rx / DC Orders ED Discharge Orders     None         Sueellen Emery, MD 08/08/23 1611    Sueellen Emery, MD 08/08/23 1621

## 2023-08-08 NOTE — Progress Notes (Addendum)
 eLink Physician-Brief Progress Note Patient Name: Kevin Velez DOB: 16-Jun-1950 MRN: [id number]   Date of Service  08/08/2023  HPI/Events of Note  73 year old man minimal past medical history or unknown past medical history lives in a group home, presents via EMS after several days of worsening lethargy, inability go to bed, weakness, found to be hypotensive improving with fluids with subsequent discovery of anemia hemoglobin of 3, acute liver failure with encephalopathy elevated INR and elevated LFTs, acute renal failure intubated in the ED.  Patient is tachypneic, tachycardic and mildly hypotensive.  Saturating 100% on 80% FiO2 on the ventilator.  Results show respiratory alkalosis, leukocytosis, lactic acidosis and macrocytic anemia.  Hg 3. Radiograph with appropriate tube positioning.  CT of the abdomen/pelvis pending  eICU Interventions  Patient is on appropriate antibiotics.  Trend hemoglobins  Updated family at bedside  Maintain lactulose   Maintain norepinephrine  as needed for MAP greater than 65, LR at a rate of 150  DVT prophylaxis contraindicated in the setting of suspected bleeding GI prophylaxis with therapeutic pantoprazole    0404 - K2.4, KCL added  0541 -reaching for the endotracheal tube, add restraints for patient safety  Intervention Category Evaluation Type: New Patient Evaluation  Brandy Kabat 08/08/2023, 8:31 PM

## 2023-08-08 NOTE — ED Triage Notes (Signed)
 BIB EMS after several days of being in the bed at a rooming house. Liver failure, AMS, emesis. 70/40-96-100% RA cbg 170

## 2023-08-08 NOTE — Sepsis Progress Note (Signed)
 Elink will follow per sepsis protocol.

## 2023-08-08 NOTE — H&P (Addendum)
 NAME:  Kevin Velez, MRN:  [id number], DOB:  1950-06-21, LOS: 0 ADMISSION DATE:  08/08/2023, CONSULTATION DATE:  08/08/23 REFERRING MD:  ED, CHIEF COMPLAINT:  AMS   History of Present Illness:  73 year old man minimal past medical history or unknown past medical history lives in a group home, presents via EMS after several days of worsening lethargy, inability go to bed, weakness, found to be hypotensive improving with fluids with subsequent discovery of anemia hemoglobin of 3, acute liver failure with encephalopathy elevated INR and elevated LFTs, acute renal failure intubated in the ED due to inability to protect airway.   Discussed with mother and sister at bedside.  He was last physically seen on Thursday.  Was standing but appeared weak.  A couple days ago spoke over the phone and he was a bit confused.  Progressive weakness at the group home.  The person in charge of the home was concerned prompting EMS being called.  Hypotension on arrival to group home.  Improved with fluids.  Vitals relatively normal on arrival.  He is encephalopathic got intubated.  Labs revealed liver failure renal failure hemoglobin of 3 with positive occult blood.  Blood cultures were obtained.  Antibiotics were started.  GI was consulted.  No note as of yet.  I requested the reach of the transplant center as a phone note liver failure of unknown etiology.  [facility] unable to accommodate transfer given lack of beds.  Decision was made to admit here to continue treatment and workup.  Pertinent  Medical History  Unknown, no significant history  Significant Hospital Events: Including procedures, antibiotic start and stop dates in addition to other pertinent events   4/21 [facility] to [facility] after EMS called, progressive lethargy, not been out of bed, found to be hypotensive and acute renal and liver failure  Interim History / Subjective:    Objective   Blood pressure 109/67, pulse 89, temperature (!) 95.6 F (35.3  C), temperature source Rectal, resp. rate (!) 23, height 5\' 9"  (1.753 m), weight 40.8 kg, SpO2 100%.    Vent Mode: PRVC FiO2 (%):  [100 %] 100 % Set Rate:  [20 bmp] 20 bmp Vt Set:  [560 mL] 560 mL PEEP:  [5 cmH20] 5 cmH20 Plateau Pressure:  [13 cmH20] 13 cmH20   Intake/Output Summary (Last 24 hours) at 08/08/2023 1645 Last data filed at 08/08/2023 1638 Gross per 24 hour  Intake 100 ml  Output --  Net 100 ml   Filed Weights   08/08/23 1336  Weight: 40.8 kg    Examination: General: Chronically ill-appearing, lying in bed HENT: Atraumatic normocephalic Lungs: Coarse ventilated sounds otherwise clear Cardiovascular: Regular rate and rhythm, no murmur no edema Abdomen: Nondistended, nontender on initial evaluation Neuro: Intubated, although continuous sedative.  Does not respond to verbal stimuli, moves all extremities to noxious stimuli   Resolved Hospital Problem list     Assessment & Plan:  Fulminant liver failure: Encephalopathy, elevated INR, elevated LFTs.  Meets all criteria.  Pattern is more cholestatic.  No imaging at time of evaluation, images delayed in the ED. Acute obstruction considered but given his clinical picture do worry about biliary or hepatic --NAC infusion, follow-up Tylenol  level, unclear if he takes Tylenol  or not, but will continue this unless we find some other obstruction etc. that would explain liver failure -- Stressed importance of obtaining CT abdomen pelvis images prior to transfer to the ICU to see if we could ascertain the etiology for his  liver failure as a relates to cholestatic picture -- Empiric Zosyn  given concern for possible biliary obstruction accounting for abnormalities, status post Vanco, cefepime , Flagyl  in the ED -- GI consultation appreciated -- I advised to reach out to transplant centers prior to excepting admission, [facility] was contacted they had no beds, can have GI assess and have their expertise weigh in on need for transfer or  not  Possible severe sepsis: With encephalopathy low blood pressure renal failure. -- Status post IV fluid resuscitation, additional 2 L LR ordered by me -- Empiric Zosyn  as above -- Follow-up blood culture data, UA ordered as well  Acute renal failure: Elevated creatinine.  Suspect hypovolemic given exam. -- Fluid resuscitation as above, LR infusion through tomorrow morning and reassess  Elevated bilirubin: Suspected obstruction versus related to liver failure as above.  Workup as above.  Severe anemia, presumed acute blood loss: Fecal occult test positive. -- 2 L ordered, repeat hemoglobin, likely will need additional transfusion given initial hemoglobin 3.3 -- PPI IV twice daily -- GI consult as above  Elevated INR: 3.0 on admission, presumably related to liver failure -- IV vitamin K 10 mg ordered  Metabolic encephalopathy: Presumably related to sepsis and liver failure.  He arouses and moves all extremities to noxious stimuli. 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: -- PRVC, follow-up blood gas, make changes as needed -- Stress ulcer prophylaxis, VAP bundle -- PEEP minimal, wean FiO2 based on O2 sats, if greater 95% can wean FiO2 gradually  Best Practice (right click and "Reselect all SmartList Selections" daily)   Diet/type: NPO DVT prophylaxis prophylactic heparin  Pressure ulcer(s): N/A GI prophylaxis: H2B Lines: Central line Foley:  Yes, and it is still needed Code Status:  full code Last date of multidisciplinary goals of care discussion [discussed with brother and sister at bedside, deferred full code]  Labs   CBC: Recent Labs  Lab 08/08/23 1336  WBC 16.2*  NEUTROABS 11.9*  HGB 3.3*  HCT 9.7*  MCV 109.0*  PLT 211    Basic Metabolic Panel: Recent Labs   Lab 08/08/23 1336  NA 135  K 3.9  CL 99  CO2 16*  GLUCOSE 123*  BUN 145*  CREATININE 2.68*  CALCIUM 8.4*   GFR: Estimated Creatinine Clearance: 14.6 mL/min (A) (by C-G formula based on SCr of 2.68 mg/dL (H)). Recent Labs  Lab 08/08/23 1336 08/08/23 1417 08/08/23 1624  WBC 16.2*  --   --   LATICACIDVEN  --  7.2* 6.7*    Liver Function Tests: Recent Labs  Lab 08/08/23 1336  AST 236*  ALT 63*  ALKPHOS 67  BILITOT 15.8*  PROT 6.6  ALBUMIN  1.8*   No results for input(s): "LIPASE", "AMYLASE" in the last 168 hours. Recent Labs  Lab 08/08/23 1420  AMMONIA 122*    ABG    Component Value Date/Time   HCO3 29.3 (H) 03/20/2007 1527   TCO2 31 03/20/2007 1527     Coagulation Profile: Recent Labs  Lab 08/08/23 1336  INR 3.0*    Cardiac Enzymes: No results for input(s): "CKTOTAL", "CKMB", "CKMBINDEX", "TROPONINI" in the last 168 hours.  HbA1C: No results found for: "HGBA1C"  CBG: No results for input(s): "GLUCAP" in the last 168 hours.  Review of Systems:   Unable to obtain,  intubated  Past Medical History:  He,  has no past medical history on file.   Surgical History:  No past surgical history on file.   Social History:   reports that he has been smoking. He has never used smokeless tobacco.   Family History:  His family history is not on file.   Allergies Not on File   Home Medications  Prior to Admission medications   Medication Sig Start Date End Date Taking? Authorizing Provider  capsicum (ZOSTRIX) 0.075 % topical cream Apply 1 application topically 2 (two) times daily as needed. 11/13/19   Afton Albright, MD  HYDROcodone -acetaminophen  (NORCO/VICODIN) 5-325 MG tablet Take 1 tablet by mouth every 6 (six) hours as needed for moderate pain or severe pain. 11/13/19   Afton Albright, MD  valACYclovir  (VALTREX ) 1000 MG tablet Take 1 tablet (1,000 mg total) by mouth 3 (three) times daily. 11/13/19   Afton Albright, MD     Critical care time:      CRITICAL CARE Performed by: Guerry Leek   Total critical care time: 45 minutes  Critical care time was exclusive of separately billable procedures and treating other patients.  Critical care was necessary to treat or prevent imminent or life-threatening deterioration.  Critical care was time spent personally by me on the following activities: development of treatment plan with patient and/or surrogate as well as nursing, discussions with consultants, evaluation of patient's response to treatment, examination of patient, obtaining history from patient or surrogate, ordering and performing treatments and interventions, ordering and review of laboratory studies, ordering and review of radiographic studies, pulse oximetry and re-evaluation of patient's condition.   Guerry Leek, MD See Amion If no response please contact on-call pager until 7 PM, after 7 PM until 7 AM please contact E-link

## 2023-08-08 NOTE — Progress Notes (Signed)
 RT transported pt from ED to 1235 without complications.

## 2023-08-08 NOTE — Progress Notes (Signed)
 Glide blade pulled from PYXIS under PT name. RT gave to primary RN. RT was unable to locate [facility] Scope that was used for this intubation.

## 2023-08-09 ENCOUNTER — Inpatient Hospital Stay (HOSPITAL_COMMUNITY)

## 2023-08-09 ENCOUNTER — Other Ambulatory Visit: Payer: Self-pay

## 2023-08-09 ENCOUNTER — Encounter (HOSPITAL_COMMUNITY): Payer: Self-pay | Admitting: Pulmonary Disease

## 2023-08-09 DIAGNOSIS — D649 Anemia, unspecified: Secondary | ICD-10-CM | POA: Diagnosis not present

## 2023-08-09 DIAGNOSIS — K7290 Hepatic failure, unspecified without coma: Secondary | ICD-10-CM

## 2023-08-09 DIAGNOSIS — I9589 Other hypotension: Secondary | ICD-10-CM | POA: Diagnosis not present

## 2023-08-09 DIAGNOSIS — K7201 Acute and subacute hepatic failure with coma: Secondary | ICD-10-CM | POA: Diagnosis not present

## 2023-08-09 DIAGNOSIS — N179 Acute kidney failure, unspecified: Secondary | ICD-10-CM | POA: Diagnosis not present

## 2023-08-09 DIAGNOSIS — G9341 Metabolic encephalopathy: Secondary | ICD-10-CM | POA: Diagnosis not present

## 2023-08-09 LAB — CBC
HCT: 23.7 % — ABNORMAL LOW (ref 39.0–52.0)
Hemoglobin: 8.3 g/dL — ABNORMAL LOW (ref 13.0–17.0)
MCH: 32.4 pg (ref 26.0–34.0)
MCHC: 35 g/dL (ref 30.0–36.0)
MCV: 92.6 fL (ref 80.0–100.0)
Platelets: 128 10*3/uL — ABNORMAL LOW (ref 150–400)
RBC: 2.56 MIL/uL — ABNORMAL LOW (ref 4.22–5.81)
RDW: 19.4 % — ABNORMAL HIGH (ref 11.5–15.5)
WBC: 17.8 10*3/uL — ABNORMAL HIGH (ref 4.0–10.5)
nRBC: 1.4 % — ABNORMAL HIGH (ref 0.0–0.2)

## 2023-08-09 LAB — COMPREHENSIVE METABOLIC PANEL WITH GFR
ALT: 58 U/L — ABNORMAL HIGH (ref 0–44)
AST: 201 U/L — ABNORMAL HIGH (ref 15–41)
Albumin: 1.6 g/dL — ABNORMAL LOW (ref 3.5–5.0)
Alkaline Phosphatase: 62 U/L (ref 38–126)
Anion gap: 13 (ref 5–15)
BUN: 111 mg/dL — ABNORMAL HIGH (ref 8–23)
CO2: 21 mmol/L — ABNORMAL LOW (ref 22–32)
Calcium: 7.5 mg/dL — ABNORMAL LOW (ref 8.9–10.3)
Chloride: 99 mmol/L (ref 98–111)
Creatinine, Ser: 1.77 mg/dL — ABNORMAL HIGH (ref 0.61–1.24)
GFR, Estimated: 41 mL/min — ABNORMAL LOW (ref >60)
Glucose, Bld: 112 mg/dL — ABNORMAL HIGH (ref 70–99)
Potassium: 2.4 mmol/L — CL (ref 3.5–5.1)
Sodium: 133 mmol/L — ABNORMAL LOW (ref 135–145)
Total Bilirubin: 15.3 mg/dL — ABNORMAL HIGH (ref 0.0–1.2)
Total Protein: 6.1 g/dL — ABNORMAL LOW (ref 6.5–8.1)

## 2023-08-09 LAB — GLUCOSE, CAPILLARY
Glucose-Capillary: 122 mg/dL — ABNORMAL HIGH (ref 70–99)
Glucose-Capillary: 128 mg/dL — ABNORMAL HIGH (ref 70–99)
Glucose-Capillary: 79 mg/dL (ref 70–99)
Glucose-Capillary: 85 mg/dL (ref 70–99)
Glucose-Capillary: 87 mg/dL (ref 70–99)
Glucose-Capillary: 88 mg/dL (ref 70–99)
Glucose-Capillary: 99 mg/dL (ref 70–99)

## 2023-08-09 LAB — ECHOCARDIOGRAM COMPLETE
AR max vel: 1.85 cm2
AV Area VTI: 2.09 cm2
AV Area mean vel: 2.06 cm2
AV Mean grad: 4 mmHg
AV Peak grad: 11.3 mmHg
Ao pk vel: 1.68 m/s
Area-P 1/2: 4.04 cm2
Calc EF: 80.2 %
Est EF: >75
Height: 69 in
Single Plane A2C EF: 82.4 %
Single Plane A4C EF: 78.2 %
Weight: 1820.12 [oz_av]

## 2023-08-09 LAB — BASIC METABOLIC PANEL WITH GFR
Anion gap: 11 (ref 5–15)
BUN: 71 mg/dL — ABNORMAL HIGH (ref 8–23)
CO2: 21 mmol/L — ABNORMAL LOW (ref 22–32)
Calcium: 7.8 mg/dL — ABNORMAL LOW (ref 8.9–10.3)
Chloride: 106 mmol/L (ref 98–111)
Creatinine, Ser: 1.16 mg/dL (ref 0.61–1.24)
GFR, Estimated: >60 mL/min (ref >60)
Glucose, Bld: 97 mg/dL (ref 70–99)
Potassium: 3.3 mmol/L — ABNORMAL LOW (ref 3.5–5.1)
Sodium: 138 mmol/L (ref 135–145)

## 2023-08-09 LAB — MAGNESIUM: Magnesium: 1.7 mg/dL (ref 1.7–2.4)

## 2023-08-09 LAB — PROTIME-INR
INR: 2.6 — ABNORMAL HIGH (ref 0.8–1.2)
Prothrombin Time: 27.8 s — ABNORMAL HIGH (ref 11.4–15.2)

## 2023-08-09 LAB — HEMOGLOBIN AND HEMATOCRIT, BLOOD
HCT: 26.4 % — ABNORMAL LOW (ref 39.0–52.0)
HCT: 20.7 % — ABNORMAL LOW (ref 39.0–52.0)
Hemoglobin: 9.1 g/dL — ABNORMAL LOW (ref 13.0–17.0)
Hemoglobin: 7.6 g/dL — ABNORMAL LOW (ref 13.0–17.0)

## 2023-08-09 LAB — HEPATITIS B CORE ANTIBODY, TOTAL: HEP B CORE AB: NEGATIVE

## 2023-08-09 LAB — PHOSPHORUS: Phosphorus: 2.8 mg/dL (ref 2.5–4.6)

## 2023-08-09 MED ORDER — POTASSIUM CHLORIDE 20 MEQ PO PACK
40.0000 meq | PACK | ORAL | Status: AC
  Administered 2023-08-09 (×2): 40 meq
  Filled 2023-08-09 (×2): qty 2

## 2023-08-09 MED ORDER — ALBUMIN HUMAN 25 % IV SOLN
25.0000 g | Freq: Four times a day (QID) | INTRAVENOUS | Status: AC
  Administered 2023-08-09: 12.5 g via INTRAVENOUS
  Administered 2023-08-09: 25 g via INTRAVENOUS
  Administered 2023-08-09: 12.5 g via INTRAVENOUS
  Filled 2023-08-09 (×3): qty 100

## 2023-08-09 MED ORDER — IOHEXOL 9 MG/ML PO SOLN
ORAL | Status: AC
  Filled 2023-08-09: qty 1000

## 2023-08-09 MED ORDER — IOHEXOL 9 MG/ML PO SOLN
1000.0000 mL | ORAL | Status: AC
  Administered 2023-08-09: 1000 mL

## 2023-08-09 MED ORDER — POTASSIUM CHLORIDE 10 MEQ/50ML IV SOLN
10.0000 meq | INTRAVENOUS | Status: AC
  Administered 2023-08-09 (×6): 10 meq via INTRAVENOUS
  Filled 2023-08-09 (×5): qty 50

## 2023-08-09 MED ORDER — INFLUENZA VAC A&B SURF ANT ADJ 0.5 ML IM SUSY
0.5000 mL | PREFILLED_SYRINGE | INTRAMUSCULAR | Status: DC
  Filled 2023-08-09: qty 0.5

## 2023-08-09 MED ORDER — PNEUMOCOCCAL 20-VAL CONJ VACC 0.5 ML IM SUSY
0.5000 mL | PREFILLED_SYRINGE | INTRAMUSCULAR | Status: DC
  Filled 2023-08-09: qty 0.5

## 2023-08-09 MED ORDER — SODIUM CHLORIDE (PF) 0.9 % IJ SOLN
INTRAMUSCULAR | Status: AC
  Administered 2023-08-09: 10 mL
  Filled 2023-08-09: qty 10

## 2023-08-09 MED ORDER — ORAL CARE MOUTH RINSE: 15.0000 mL | OROMUCOSAL | Status: DC | PRN

## 2023-08-09 MED ORDER — IOHEXOL 9 MG/ML PO SOLN: 1000.0000 mL | ORAL | Status: DC

## 2023-08-09 MED ORDER — POTASSIUM CHLORIDE 20 MEQ PO PACK
40.0000 meq | PACK | ORAL | Status: AC
  Administered 2023-08-10 (×2): 40 meq
  Filled 2023-08-09 (×2): qty 2

## 2023-08-09 MED ORDER — PREDNISOLONE 5 MG PO TABS
40.0000 mg | ORAL_TABLET | Freq: Every day | ORAL | Status: DC
  Administered 2023-08-09 – 2023-08-11 (×3): 40 mg
  Filled 2023-08-09 (×3): qty 8

## 2023-08-09 MED ORDER — MAGNESIUM SULFATE 2 GM/50ML IV SOLN
2.0000 g | Freq: Once | INTRAVENOUS | Status: AC
  Administered 2023-08-09: 2 g via INTRAVENOUS
  Filled 2023-08-09: qty 50

## 2023-08-09 MED ORDER — PIPERACILLIN-TAZOBACTAM 3.375 G IVPB
3.3750 g | Freq: Three times a day (TID) | INTRAVENOUS | Status: DC
  Administered 2023-08-09 – 2023-08-15 (×18): 3.375 g via INTRAVENOUS
  Filled 2023-08-09 (×18): qty 50

## 2023-08-09 MED ORDER — ORAL CARE MOUTH RINSE
15.0000 mL | OROMUCOSAL | Status: DC
  Administered 2023-08-09 – 2023-08-10 (×7): 15 mL via OROMUCOSAL

## 2023-08-09 MED ORDER — PERFLUTREN LIPID MICROSPHERE
1.0000 mL | INTRAVENOUS | Status: AC | PRN
  Administered 2023-08-09: 2 mL via INTRAVENOUS

## 2023-08-09 NOTE — Progress Notes (Signed)
 NAME:  Kevin Velez, MRN:  [id number], DOB:  1951-02-04, LOS: 1 ADMISSION DATE:  08/08/2023, CONSULTATION DATE:  08/09/23 REFERRING MD:  ED, CHIEF COMPLAINT:  AMS   History of Present Illness:  73 year old man minimal past medical history or unknown past medical history lives in a group home, presents via EMS after several days of worsening lethargy, inability go to bed, weakness, found to be hypotensive improving with fluids with subsequent discovery of anemia hemoglobin of 3, acute liver failure with encephalopathy elevated INR and elevated LFTs, acute renal failure intubated in the ED due to inability to protect airway.   Discussed with mother and sister at bedside.  He was last physically seen on Thursday.  Was standing but appeared weak.  A couple days ago spoke over the phone and he was a bit confused.  Progressive weakness at the group home.  The person in charge of the home was concerned prompting EMS being called.  Hypotension on arrival to group home.  Improved with fluids.  Vitals relatively normal on arrival.  He is encephalopathic got intubated.  Labs revealed liver failure renal failure hemoglobin of 3 with positive occult blood.  Blood cultures were obtained.  Antibiotics were started.  GI was consulted.  No note as of yet.  I requested the reach of the transplant center as a phone note liver failure of unknown etiology.  [facility] unable to accommodate transfer given lack of beds.  Decision was made to admit here to continue treatment and workup.  Pertinent  Medical History  Unknown, no significant history  Significant Hospital Events: Including procedures, antibiotic start and stop dates in addition to other pertinent events   4/21 presents to [facility] after EMS called, progressive lethargy, not been out of bed, found to be hypotensive and acute renal and liver failure  Interim History / Subjective:  NAEON, purposeful movements, Cr better, T bili unchanged. STAT CT A/P ordered  at 1600 4/21 has yet to be performed.   Objective   Blood pressure 119/66, pulse (!) 113, temperature 97.7 F (36.5 C), resp. rate 17, height 5\' 9"  (1.753 m), weight 51.6 kg, SpO2 100%.    Vent Mode: PRVC FiO2 (%):  [40 %-100 %] 40 % Set Rate:  [14 bmp-20 bmp] 14 bmp Vt Set:  [560 mL] 560 mL PEEP:  [5 cmH20] 5 cmH20 Plateau Pressure:  [13 cmH20-15 cmH20] 15 cmH20   Intake/Output Summary (Last 24 hours) at 08/09/2023 0836 Last data filed at 08/09/2023 0800 Gross per 24 hour  Intake 5180.12 ml  Output --  Net 5180.12 ml   Filed Weights   08/08/23 1336 08/08/23 2100 08/09/23 0500  Weight: 40.8 kg 51.6 kg 51.6 kg    Examination: General: Chronically ill-appearing, lying in bed HENT: Atraumatic normocephalic Lungs: Coarse ventilated sounds otherwise clear Cardiovascular: Regular rate and rhythm, no murmur no edema Abdomen: Nondistended, nontender Neuro: Intubated, although no continuous sedative.  Moves spontaneously, semi purposeful   Resolved Hospital Problem list     Assessment & Plan:  Fulminant liver failure: Encephalopathy, elevated INR, elevated LFTs.  Meets all criteria.  Pattern is more cholestatic.  No imaging at time of evaluation, images delayed in the ED. Acute obstruction considered but given his clinical picture do worry about biliary or hepatic -- Complete NAC infusion, Tylenol  level undetectable -- STAT abdominal CT not obtained despite stressing importance yesterday, RUQ US  STAT given ease in obtaining and consider sending for CT based on results -- Continue empiric Zosyn   given concern for possible biliary obstruction accounting for abnormalities -- GI consultation appreciated -- I advised to reach out to transplant centers prior to excepting admission, [facility] was contacted they had no beds, can have GI assess and have their expertise weigh in on need for transfer or not  Possible severe sepsis/septic shock: With encephalopathy low blood pressure renal  failure. -- Status post IV fluid resuscitation, albumin  throughout the day -- Empiric Zosyn  as above -- Follow-up blood culture data, UA mild pyuria negative LE and nitrites  Acute renal failure: Elevated creatinine.  Suspect hypovolemic given exam. -- Fluid resuscitation as above, continue LR infusion through tomorrow morning and reassess -- Start TF if pressures improve  Elevated bilirubin: Suspected obstruction versus related to liver failure as above.  Workup as above.  Severe anemia, presumed acute blood loss: Fecal occult test positive. -- s/p transfusion with improved Hgb -- PPI IV twice daily -- GI consult as above  Elevated INR: 3.0 on admission, presumably related to liver failure -- s/p IV Vit K 10 mg, repeat INR 2.6  Metabolic encephalopathy: Presumably related to sepsis and liver failure.  He arouses and moves all extremities to noxious stimuli. 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: -- PRVC -- Stress ulcer prophylaxis, VAP bundle -- PEEP minimal, wean FiO2 based on O2 sats, if greater 95% can wean FiO2 gradually  Best Practice (right click and "Reselect all SmartList Selections" daily)   Diet/type: NPO DVT prophylaxis prophylactic heparin  Pressure ulcer(s): N/A GI prophylaxis: H2B Lines: Central line Foley:  Yes, and it is still needed Code Status:  full code Last date of multidisciplinary goals of care discussion [discussed with brother and sister at bedside, deferred full code]  Labs   CBC: Recent Labs  Lab 08/08/23 1336 08/08/23 1610 08/09/23 0251  WBC 16.2* 13.7* 17.8*  NEUTROABS 11.9* 10.7*  --   HGB 3.3* 3.4* 8.3*  HCT 9.7* 9.7* 23.7*  MCV 109.0* 107.8* 92.6  PLT 211 136* 128*    Basic Metabolic Panel: Recent Labs  Lab 08/08/23 1336  08/09/23 0251 08/09/23 0252  NA 135 133*  --   K 3.9 2.4*  --   CL 99 99  --   CO2 16* 21*  --   GLUCOSE 123* 112*  --   BUN 145* 111*  --   CREATININE 2.68* 1.77*  --   CALCIUM  8.4* 7.5*  --   MG  --   --  1.7  PHOS  --   --  2.8   GFR: Estimated Creatinine Clearance: 27.9 mL/min (A) (by C-G formula based on SCr of 1.77 mg/dL (H)). Recent Labs  Lab 08/08/23 1336 08/08/23 1417 08/08/23 1610 08/08/23 1624 08/09/23 0251  WBC 16.2*  --  13.7*  --  17.8*  LATICACIDVEN  --  7.2*  --  6.7*  --     Liver Function Tests: Recent Labs  Lab 08/08/23 1336 08/09/23 0251  AST 236* 201*  ALT 63* 58*  ALKPHOS 67 62  BILITOT 15.8* 15.3*  PROT 6.6 6.1*  ALBUMIN  1.8* 1.6*   No results for input(s): "LIPASE", "AMYLASE" in the last 168 hours. Recent Labs  Lab 08/08/23 1420  AMMONIA 122*    ABG    Component Value Date/Time   PHART 7.5 (H) 08/08/2023 2100  PCO2ART 25 (L) 08/08/2023 2100   PO2ART 305 (H) 08/08/2023 2100   HCO3 19.5 (L) 08/08/2023 2100   TCO2 31 03/20/2007 1527   ACIDBASEDEF 2.1 (H) 08/08/2023 2100   O2SAT 99.9 08/08/2023 2100     Coagulation Profile: Recent Labs  Lab 08/08/23 1336 08/09/23 0252  INR 3.0* 2.6*    Cardiac Enzymes: Recent Labs  Lab 08/08/23 1610  CKTOTAL 14*    HbA1C: No results found for: "HGBA1C"  CBG: Recent Labs  Lab 08/09/23 0008 08/09/23 0341 08/09/23 0730  GLUCAP 128* 122* 99    Review of Systems:   Unable to obtain, intubated  Past Medical History:  He,  has no past medical history on file.   Surgical History:  No past surgical history on file.   Social History:   reports that he has been smoking. He has never used smokeless tobacco.   Family History:  His family history is not on file.   Allergies Not on File   Home Medications  Prior to Admission medications   Medication Sig Start Date End Date Taking? Authorizing Provider  capsicum (ZOSTRIX) 0.075 % topical cream Apply 1 application topically  2 (two) times daily as needed. 11/13/19   Afton Albright, MD  HYDROcodone -acetaminophen  (NORCO/VICODIN) 5-325 MG tablet Take 1 tablet by mouth every 6 (six) hours as needed for moderate pain or severe pain. 11/13/19   Afton Albright, MD  valACYclovir  (VALTREX ) 1000 MG tablet Take 1 tablet (1,000 mg total) by mouth 3 (three) times daily. 11/13/19   Afton Albright, MD     Critical care time:     CRITICAL CARE Performed by: Guerry Leek   Total critical care time: 35 minutes  Critical care time was exclusive of separately billable procedures and treating other patients.  Critical care was necessary to treat or prevent imminent or life-threatening deterioration.  Critical care was time spent personally by me on the following activities: development of treatment plan with patient and/or surrogate as well as nursing, discussions with consultants, evaluation of patient's response to treatment, examination of patient, obtaining history from patient or surrogate, ordering and performing treatments and interventions, ordering and review of laboratory studies, ordering and review of radiographic studies, pulse oximetry and re-evaluation of patient's condition.   Guerry Leek, MD See Amion If no response please contact on-call pager until 7 PM, after 7 PM until 7 AM please contact E-link

## 2023-08-09 NOTE — Progress Notes (Signed)
 [facility] ADULT ICU REPLACEMENT PROTOCOL   The patient does apply for the [facility] Adult ICU Electrolyte Replacment Protocol based on the criteria listed below:   1.Exclusion criteria: TCTS, ECMO, Dialysis, and Myasthenia Gravis patients 2. Is GFR >/= 30 ml/min? Yes.    Patient's GFR today is 41 3. Is SCr </= 2? Yes.   Patient's SCr is 1.77 mg/dL 4. Did SCr increase >/= 0.5 in 24 hours? No. 5.Pt's weight >40kg  Yes.   6. Abnormal electrolyte(s): mag 1.7  7. Electrolytes replaced per protocol 8.  Call MD STAT for K+ </= 2.5, Phos </= 1, or Mag </= 1 Physician:  protocol  Marva Sleight 08/09/2023 4:02 AM

## 2023-08-09 NOTE — TOC Initial Note (Signed)
 Transition of Care [facility]) - Initial/Assessment Note   Patient Details  Name: Kevin Velez MRN: [id number] Date of Birth: 1950/07/13  Transition of Care [facility]) CM/SW Contact:    Zenon Hilda, LCSW Phone Number: 08/09/2023, 11:20 AM  Clinical Narrative: Patient is from a group. Patient is currently on a vent and needing restraints. TOC following for discharge needs.  Expected Discharge Plan: Group Home Barriers to Discharge: Continued Medical Work up  Patient Goals and CMS Choice Patient states their goals for this hospitalization and ongoing recovery are:: Patient on vent  Expected Discharge Plan and Services In-house Referral: Clinical Social Work Living arrangements for the past 2 months: Group Home  Prior Living Arrangements/Services Living arrangements for the past 2 months: Group Home Lives with:: Facility Resident Patient language and need for interpreter reviewed:: Yes Need for Family Participation in Patient Care: Yes (Comment) (Patient currently on vent.) Care giver support system in place?: Yes (comment) Criminal Activity/Legal Involvement Pertinent to Current Situation/Hospitalization: No - Comment as needed  Activities of Daily Living ADL Screening (condition at time of admission) Independently performs ADLs?: No Does the patient have a NEW difficulty with bathing/dressing/toileting/self-feeding that is expected to last >3 days?: No Does the patient have a NEW difficulty with getting in/out of bed, walking, or climbing stairs that is expected to last >3 days?: No Does the patient have a NEW difficulty with communication that is expected to last >3 days?: No Is the patient deaf or have difficulty hearing?: No Does the patient have difficulty seeing, even when wearing glasses/contacts?: No Does the patient have difficulty concentrating, remembering, or making decisions?: Yes  Emotional Assessment Attitude/Demeanor/Rapport: Unable to Assess Affect (typically  observed): Unable to Assess Orientation: :  (On vent) Alcohol / Substance Use: Not Applicable Psych Involvement: No (comment)  Admission diagnosis:  Hepatic encephalopathy (HCC) [K76.82] Dehydration [E86.0] Liver failure (HCC) [K72.90] AKI (acute kidney injury) (HCC) [N17.9] Hypothermia, initial encounter [T68.XXXA] Symptomatic anemia [D64.9] Gastrointestinal hemorrhage, unspecified gastrointestinal hemorrhage type [K92.2] Liver failure with hepatic coma, unspecified chronicity (HCC) [K72.91] Patient Active Problem List   Diagnosis Date Noted   Liver failure (HCC) 08/08/2023   PCP:  Patient, No Pcp Per Pharmacy:   [facility] [phone] Jonette Nestle, [place] - [address] [place] [address] Phone: [phone] Fax: [phone]  Social Drivers of Health (SDOH) Social History: SDOH Screenings   Food Insecurity: Patient Unable To Answer (08/09/2023)  Housing: Patient Unable To Answer (08/09/2023)  Transportation Needs: Patient Unable To Answer (08/09/2023)  Utilities: Patient Unable To Answer (08/09/2023)  Social Connections: Patient Unable To Answer (08/09/2023)  Tobacco Use: High Risk (08/09/2023)   SDOH Interventions:    Readmission Risk Interventions     No data to display

## 2023-08-09 NOTE — Progress Notes (Signed)
 Met with brother in  afternoon at bedside.  He had brought records from [facility].  Chronically elevated AST ALT similar levels.  T. bili previously normal.  Ultrasound shows signs of cirrhosis.  Ascites.  No acute blockage.  He does drink alcohol.  Patient is more alert this afternoon communicative on the vent simple yes/no questions head nods.  Indicate he has been drinking more recently.  High suspicion for acute alcoholic hepatitis to pain off decompensation and elevated bilirubin etc.  Maddrey's discriminant function score 102.  Starting daily prednisolone  given elevated discriminant function.

## 2023-08-09 NOTE — Progress Notes (Signed)
 Initial Nutrition Assessment  DOCUMENTATION CODES:   Severe malnutrition in context of chronic illness  INTERVENTION:  - If patient to remain intubated, would recommend initiating tube feeds once medically appropriate.  Vital 1.2 at 55 ml/h (1320 ml per day) *Would recommend starting at 45mL/hr and advancing by 10mL Q12H Provides 1584 kcal, 99 gm protein, 1070 ml free water  daily  - If initiating TF, monitor magnesium , potassium, and phosphorus BID for at least 3 days, MD to replete as needed, as pt is at risk for refeeding syndrome given severe malnutrition.  - FWF per CCM/MD.   - Monitor weight trends.    NUTRITION DIAGNOSIS:   Severe Malnutrition related to chronic illness as evidenced by severe fat depletion, severe muscle depletion.  GOAL:   Patient will meet greater than or equal to 90% of their needs  MONITOR:   Vent status, Labs, Weight trends  REASON FOR ASSESSMENT:   Ventilator    ASSESSMENT:   73 y.o. male with unknown PMH who lives in a group home, presented after several days of worsening lethargy, inability go to bed, weakness. Admitted for live failure, encephalopathy, and severe sepsis.  4/21 Admit; Intubated  Patient is currently intubated on ventilator support MV: 10.4 L/min Temp (24hrs), Avg:97.8 F (36.6 C), Min:96.6 F (35.9 C), Max:98.2 F (36.8 C)  Patient's niece at bedside at time of visit. She is unsure of patient's UBW but reports he has been losing weight over the past 2 years, since losing his daughter and son within a short time frame of each other.  Per EMR, no weight history PTA to assess recent trends. Patient initially weighed at 90# but current weight is 113# so accurate weight difficult to determine.  Niece speculates that the patient has not been eating well for a long time but admits she isn't really sure as he does not come around to see their family often.   Per CCM, can try and start TF once pressure improve. OGT xray  verified in the stomach.    Medications reviewed and include: Lactulose  TID Levophed  @ 11 mcg/min  Labs reviewed:  Na 133 K+ 2.4 Creatinine 1.77   NUTRITION - FOCUSED PHYSICAL EXAM:  Flowsheet Row Most Recent Value  Orbital Region Severe depletion  Upper Arm Region Severe depletion  Thoracic and Lumbar Region Severe depletion  Buccal Region Unable to assess  Temple Region Severe depletion  Clavicle Bone Region Moderate depletion  Clavicle and Acromion Bone Region Severe depletion  Scapular Bone Region Unable to assess  Dorsal Hand Unable to assess  Patellar Region Severe depletion  Anterior Thigh Region Severe depletion  Posterior Calf Region Severe depletion  Edema (RD Assessment) None  Hair Reviewed  Eyes Unable to assess  Mouth Unable to assess  Skin Reviewed  Nails Reviewed       Diet Order:   Diet Order             Diet NPO time specified  Diet effective now                   EDUCATION NEEDS:  No education needs have been identified at this time  Skin:  Skin Assessment: Reviewed RN Assessment  Last BM:  unknown  Height:  Ht Readings from Last 1 Encounters:  08/08/23 5\' 9"  (1.753 m)   Weight:  Wt Readings from Last 1 Encounters:  08/09/23 51.6 kg   Ideal Body Weight:  72.73 kg  BMI:  Body mass index is 16.8 kg/m.  Estimated Nutritional Needs:  Kcal:  1450-1650 kcals Protein:  75-100 grams Fluid:  >/= 1.5L    Scheryl Cushing RD, LDN Contact via Secure Chat.

## 2023-08-09 NOTE — Consult Note (Signed)
 Reason for Consult: Elevated liver tests anemia guaiac positivity Referring Physician: Hospital team  Kevin Velez is an 73 y.o. male.  HPI: Patient seen and examined and case discussed with his nurse and the ER physician as well as his brother who provided the majority of the history and his hospital computer chart was reviewed and he has not been in inpatient anywhere in years and just goes to urgent care periodically and denies any aspirin or nonsteroidals at home but will use Tylenol  and did drink a moderate amount of alcohol  in his younger days but not much lately and liver problems and GI problems do not run in the family and the patient does say his abdomen hurts but no other obvious history was able to be obtained but no signs of active bleeding since he has been in the hospital  History reviewed. No pertinent past medical history.  History reviewed. No pertinent surgical history.  History reviewed. No pertinent family history.  Social History:  reports that he has been smoking. He has never used smokeless tobacco. No history on file for alcohol  use and drug use.  Allergies: Not on File  Medications: I have reviewed the patient's current medications.  Results for orders placed or performed during the hospital encounter of 08/08/23 (from the past 48 hours)  Comprehensive metabolic panel     Status: Abnormal   Collection Time: 08/08/23  1:36 PM  Result Value Ref Range   Sodium 135 135 - 145 mmol/L   Potassium 3.9 3.5 - 5.1 mmol/L    Comment: HEMOLYSIS AT THIS LEVEL MAY AFFECT RESULT   Chloride 99 98 - 111 mmol/L   CO2 16 (L) 22 - 32 mmol/L   Glucose, Bld 123 (H) 70 - 99 mg/dL    Comment: Glucose reference range applies only to samples taken after fasting for at least 8 hours.   BUN 145 (H) 8 - 23 mg/dL    Comment: RESULT CONFIRMED BY MANUAL DILUTION   Creatinine, Ser 2.68 (H) 0.61 - 1.24 mg/dL   Calcium  8.4 (L) 8.9 - 10.3 mg/dL   Total Protein 6.6 6.5 - 8.1 g/dL   Albumin   1.8 (L) 3.5 - 5.0 g/dL   AST 409 (H) 15 - 41 U/L    Comment: HEMOLYSIS AT THIS LEVEL MAY AFFECT RESULT   ALT 63 (H) 0 - 44 U/L    Comment: HEMOLYSIS AT THIS LEVEL MAY AFFECT RESULT   Alkaline Phosphatase 67 38 - 126 U/L   Total Bilirubin 15.8 (H) 0.0 - 1.2 mg/dL    Comment: HEMOLYSIS AT THIS LEVEL MAY AFFECT RESULT   GFR, Estimated 25 (L) >60 mL/min    Comment: (NOTE) Calculated using the CKD-EPI Creatinine Equation (2021)    Anion gap 20 (H) 5 - 15    Comment: Performed at [facility], [address] [place], [place] [address]  CBC with Differential     Status: Abnormal   Collection Time: 08/08/23  1:36 PM  Result Value Ref Range   WBC 16.2 (H) 4.0 - 10.5 K/uL   RBC 0.89 (L) 4.22 - 5.81 MIL/uL   Hemoglobin 3.3 (LL) 13.0 - 17.0 g/dL    Comment: This critical result has verified and been called to SAVOIE,B. RN by Pricilla Brook on 04 21 2025 at 1434, and has been read back. REPEATED TO VERIFY   HCT 9.7 (L) 39.0 - 52.0 %   MCV 109.0 (H) 80.0 - 100.0 fL   MCH 37.1 (H) 26.0 -  34.0 pg   MCHC 34.0 30.0 - 36.0 g/dL   RDW 78.2 (H) 95.6 - 21.3 %   Platelets 211 150 - 400 K/uL   nRBC 0.9 (H) 0.0 - 0.2 %   Neutrophils Relative % 74 %   Neutro Abs 11.9 (H) 1.7 - 7.7 K/uL   Lymphocytes Relative 17 %   Lymphs Abs 2.7 0.7 - 4.0 K/uL   Monocytes Relative 7 %   Monocytes Absolute 1.2 (H) 0.1 - 1.0 K/uL   Eosinophils Relative 0 %   Eosinophils Absolute 0.1 0.0 - 0.5 K/uL   Basophils Relative 0 %   Basophils Absolute 0.0 0.0 - 0.1 K/uL   Immature Granulocytes 2 %   Abs Immature Granulocytes 0.35 (H) 0.00 - 0.07 K/uL    Comment: Performed at [facility], [address] [place], [place] [address]  Protime-INR     Status: Abnormal   Collection Time: 08/08/23  1:36 PM  Result Value Ref Range   Prothrombin Time 31.5 (H) 11.4 - 15.2 seconds   INR 3.0 (H) 0.8 - 1.2    Comment: (NOTE) INR goal varies based on device and disease states. Performed at  [facility], [address] [place], [place] [address]   Urinalysis, w/ Reflex to Culture (Infection Suspected) -Urine, Catheterized     Status: Abnormal   Collection Time: 08/08/23  1:36 PM  Result Value Ref Range   Specimen Source URINE, CATHETERIZED    Color, Urine AMBER (A) YELLOW    Comment: BIOCHEMICALS MAY BE AFFECTED BY COLOR   APPearance CLEAR CLEAR   Specific Gravity, Urine 1.015 1.005 - 1.030   pH 5.0 5.0 - 8.0   Glucose, UA NEGATIVE NEGATIVE mg/dL   Hgb urine dipstick NEGATIVE NEGATIVE   Bilirubin Urine SMALL (A) NEGATIVE   Ketones, ur NEGATIVE NEGATIVE mg/dL   Protein, ur NEGATIVE NEGATIVE mg/dL   Nitrite NEGATIVE NEGATIVE   Leukocytes,Ua NEGATIVE NEGATIVE   RBC / HPF 0-5 0 - 5 RBC/hpf   WBC, UA 0-5 0 - 5 WBC/hpf    Comment:        Reflex urine culture not performed if WBC <=10, OR if Squamous epithelial cells >5. If Squamous epithelial cells >5 suggest recollection.    Bacteria, UA RARE (A) NONE SEEN   Squamous Epithelial / HPF 0-5 0 - 5 /HPF   Mucus PRESENT    Hyaline Casts, UA PRESENT     Comment: Performed at [facility], [address] [place], [place] [address]  ABO/Rh     Status: None   Collection Time: 08/08/23  1:36 PM  Result Value Ref Range   ABO/RH(D)      O POS Performed at [facility], [address] [place], [place] [address]   Type and screen [facility]     Status: None (Preliminary result)   Collection Time: 08/08/23  1:45 PM  Result Value Ref Range   ABO/RH(D) O POS    Antibody Screen NEG    Sample Expiration 08/11/2023,2359    Unit Number [id number]    Blood Component Type RED CELLS,LR    Unit division 00    Status of Unit ISSUED    Transfusion Status OK TO TRANSFUSE    Crossmatch Result Compatible    Unit Number [id number]    Blood Component Type RED CELLS,LR    Unit division 00    Status of Unit ISSUED    Transfusion Status OK  TO TRANSFUSE     Crossmatch Result      Compatible Performed at [facility], [address], [place], [place] [address]   I-Stat Lactic Acid, ED     Status: Abnormal   Collection Time: 08/08/23  2:17 PM  Result Value Ref Range   Lactic Acid, Venous 7.2 (HH) 0.5 - 1.9 mmol/L   Comment NOTIFIED PHYSICIAN   Ammonia     Status: Abnormal   Collection Time: 08/08/23  2:20 PM  Result Value Ref Range   Ammonia 122 (H) 9 - 35 umol/L    Comment: HEMOLYSIS AT THIS LEVEL MAY AFFECT RESULT Performed at [facility], [address], [place], [place] [address]   POC occult blood, ED Provider will collect     Status: Abnormal   Collection Time: 08/08/23  2:53 PM  Result Value Ref Range   Fecal Occult Bld POSITIVE (A) NEGATIVE  Prepare RBC (crossmatch)     Status: None   Collection Time: 08/08/23  2:55 PM  Result Value Ref Range   Order Confirmation      ORDER PROCESSED BY BLOOD BANK Performed at [facility], [address] [place], [place] [address]   Vitamin B12     Status: Abnormal   Collection Time: 08/08/23  3:45 PM  Result Value Ref Range   Vitamin B-12 3,700 (H) 180 - 914 pg/mL    Comment: RESULT CONFIRMED BY MANUAL DILUTION (NOTE) This assay is not validated for testing neonatal or myeloproliferative syndrome specimens for Vitamin B12 levels. Performed at [facility], [address] [place], [place] [address]   Folate     Status: Abnormal   Collection Time: 08/08/23  3:45 PM  Result Value Ref Range   Folate 4.0 (L) >5.9 ng/mL    Comment: Performed at [facility], [address], [place], [place] [address]  Iron and TIBC     Status: None   Collection Time: 08/08/23  3:45 PM  Result Value Ref Range   Iron 80 45 - 182 ug/dL   TIBC NOT CALCULATED 244 - 450 ug/dL   Saturation Ratios NOT CALCULATED 17.9 - 39.5 %   UIBC NOT CALCULATED ug/dL    Comment: Performed at [facility],  [address], [place], [place] [address]  Ferritin     Status: Abnormal   Collection Time: 08/08/23  3:45 PM  Result Value Ref Range   Ferritin 3,526 (H) 24 - 336 ng/mL    Comment: Performed at [facility], [address], [place], [place] [address]  Hepatitis panel, acute     Status: None   Collection Time: 08/08/23  3:45 PM  Result Value Ref Range   Hepatitis B Surface Ag NON REACTIVE NON REACTIVE   HCV Ab NON REACTIVE NON REACTIVE    Comment: (NOTE) Nonreactive HCV antibody screen is consistent with no HCV infections,  unless recent infection is suspected or other evidence exists to indicate HCV infection.     Hep A IgM NON REACTIVE NON REACTIVE   Hep B C IgM NON REACTIVE NON REACTIVE    Comment: Performed at [facility], [address] [place], [place] [address]  Acetaminophen  level     Status: Abnormal   Collection Time: 08/08/23  4:10 PM  Result Value Ref Range   Acetaminophen  (Tylenol ), Serum <10 (L) 10 - 30 ug/mL    Comment: (NOTE) Therapeutic concentrations vary significantly. A range  of 10-30 ug/mL  may be an effective concentration for many patients. However, some  are best treated at concentrations outside of this range. Acetaminophen  concentrations >150 ug/mL at 4 hours after ingestion  and >50 ug/mL at 12 hours after ingestion are often associated with  toxic reactions.  Performed at [facility], [address] [place], [place] [address]   Ethanol     Status: None   Collection Time: 08/08/23  4:10 PM  Result Value Ref Range   Alcohol, Ethyl (B) <10 <10 mg/dL    Comment: (NOTE) For medical purposes only. Performed at [facility], [address], [place], [place] [address]   CK     Status: Abnormal   Collection Time: 08/08/23  4:10 PM  Result Value Ref Range   Total CK 14 (L) 49 - 397 U/L    Comment: Performed at [facility], [address], [place], [place]  [address]  CBC with Differential     Status: Abnormal   Collection Time: 08/08/23  4:10 PM  Result Value Ref Range   WBC 13.7 (H) 4.0 - 10.5 K/uL   RBC 0.90 (L) 4.22 - 5.81 MIL/uL   Hemoglobin 3.4 (LL) 13.0 - 17.0 g/dL    Comment: CRITICAL VALUE NOTED.  VALUE IS CONSISTENT WITH PREVIOUSLY REPORTED AND CALLED VALUE. REPEATED TO VERIFY    HCT 9.7 (L) 39.0 - 52.0 %   MCV 107.8 (H) 80.0 - 100.0 fL   MCH 37.8 (H) 26.0 - 34.0 pg   MCHC 35.1 30.0 - 36.0 g/dL   RDW 91.4 (H) 78.2 - 95.6 %   Platelets 136 (L) 150 - 400 K/uL   nRBC 0.7 (H) 0.0 - 0.2 %   Neutrophils Relative % 78 %   Neutro Abs 10.7 (H) 1.7 - 7.7 K/uL   Lymphocytes Relative 14 %   Lymphs Abs 1.9 0.7 - 4.0 K/uL   Monocytes Relative 6 %   Monocytes Absolute 0.9 0.1 - 1.0 K/uL   Eosinophils Relative 0 %   Eosinophils Absolute 0.0 0.0 - 0.5 K/uL   Basophils Relative 0 %   Basophils Absolute 0.0 0.0 - 0.1 K/uL   Immature Granulocytes 2 %   Abs Immature Granulocytes 0.25 (H) 0.00 - 0.07 K/uL    Comment: Performed at [facility], [address] [place], [place] [address]  Reticulocytes     Status: Abnormal   Collection Time: 08/08/23  4:10 PM  Result Value Ref Range   Retic Ct Pct 18.2 (H) 0.4 - 3.1 %   RBC. 2.64 (L) 4.22 - 5.81 MIL/uL   Retic Count, Absolute 480.5 (H) 19.0 - 186.0 K/uL   Immature Retic Fract 37.7 (H) 2.3 - 15.9 %    Comment: Performed at [facility], [address], [place], [place] [address]  I-Stat Lactic Acid, ED     Status: Abnormal   Collection Time: 08/08/23  4:24 PM  Result Value Ref Range   Lactic Acid, Venous 6.7 (HH) 0.5 - 1.9 mmol/L   Comment NOTIFIED PHYSICIAN   Urinalysis, Routine w reflex microscopic -Urine, Catheterized     Status: Abnormal   Collection Time: 08/08/23  4:40 PM  Result Value Ref Range   Color, Urine AMBER (A) YELLOW    Comment: BIOCHEMICALS MAY BE AFFECTED BY COLOR   APPearance CLEAR CLEAR   Specific Gravity, Urine 1.014 1.005 - 1.030    pH 5.0 5.0 - 8.0   Glucose, UA  NEGATIVE NEGATIVE mg/dL   Hgb urine dipstick MODERATE (A) NEGATIVE   Bilirubin Urine SMALL (A) NEGATIVE   Ketones, ur NEGATIVE NEGATIVE mg/dL   Protein, ur NEGATIVE NEGATIVE mg/dL   Nitrite NEGATIVE NEGATIVE   Leukocytes,Ua NEGATIVE NEGATIVE   RBC / HPF 0-5 0 - 5 RBC/hpf   WBC, UA 6-10 0 - 5 WBC/hpf   Bacteria, UA RARE (A) NONE SEEN   Squamous Epithelial / HPF 0-5 0 - 5 /HPF   Mucus PRESENT    Hyaline Casts, UA PRESENT     Comment: Performed at [facility], [address], [place], [place] [address]  Resp panel by RT-PCR (RSV, Flu A&B, Covid) Anterior Nasal Swab     Status: None   Collection Time: 08/08/23  4:50 PM   Specimen: Anterior Nasal Swab  Result Value Ref Range   SARS Coronavirus 2 by RT PCR NEGATIVE NEGATIVE    Comment: (NOTE) SARS-CoV-2 target nucleic acids are NOT DETECTED.  The SARS-CoV-2 RNA is generally detectable in upper respiratory specimens during the acute phase of infection. The lowest concentration of SARS-CoV-2 viral copies this assay can detect is 138 copies/mL. A negative result does not preclude SARS-Cov-2 infection and should not be used as the sole basis for treatment or other patient management decisions. A negative result may occur with  improper specimen collection/handling, submission of specimen other than nasopharyngeal swab, presence of viral mutation(s) within the areas targeted by this assay, and inadequate number of viral copies(<138 copies/mL). A negative result must be combined with clinical observations, patient history, and epidemiological information. The expected result is Negative.  Fact Sheet for Patients:  BloggerCourse.com  Fact Sheet for Healthcare Providers:  SeriousBroker.it  This test is no t yet approved or cleared by the [place]  FDA and  has been authorized for detection and/or diagnosis of SARS-CoV-2 by FDA  under an Emergency Use Authorization (EUA). This EUA will remain  in effect (meaning this test can be used) for the duration of the COVID-19 declaration under Section 564(b)(1) of the Act, 21 U.S.C.section 360bbb-3(b)(1), unless the authorization is terminated  or revoked sooner.       Influenza A by PCR NEGATIVE NEGATIVE   Influenza B by PCR NEGATIVE NEGATIVE    Comment: (NOTE) The Xpert Xpress SARS-CoV-2/FLU/RSV plus assay is intended as an aid in the diagnosis of influenza from Nasopharyngeal swab specimens and should not be used as a sole basis for treatment. Nasal washings and aspirates are unacceptable for Xpert Xpress SARS-CoV-2/FLU/RSV testing.  Fact Sheet for Patients: BloggerCourse.com  Fact Sheet for Healthcare Providers: SeriousBroker.it  This test is not yet approved or cleared by the [place]  FDA and has been authorized for detection and/or diagnosis of SARS-CoV-2 by FDA under an Emergency Use Authorization (EUA). This EUA will remain in effect (meaning this test can be used) for the duration of the COVID-19 declaration under Section 564(b)(1) of the Act, 21 U.S.C. section 360bbb-3(b)(1), unless the authorization is terminated or revoked.     Resp Syncytial Virus by PCR NEGATIVE NEGATIVE    Comment: (NOTE) Fact Sheet for Patients: BloggerCourse.com  Fact Sheet for Healthcare Providers: SeriousBroker.it  This test is not yet approved or cleared by the [place]  FDA and has been authorized for detection and/or diagnosis of SARS-CoV-2 by FDA under an Emergency Use Authorization (EUA). This EUA will remain in effect (meaning this test can be used) for the duration of the COVID-19 declaration under Section 564(b)(1) of the Act, 21 U.S.C.  section 360bbb-3(b)(1), unless the authorization is terminated or revoked.  Performed at [facility], [address] [place], [place] [address]   Blood gas, arterial     Status: Abnormal   Collection Time: 08/08/23  4:53 PM  Result Value Ref Range   O2 Content 100.0 L/min   Delivery systems VENTILATOR    Mode PRESSURE REGULATED VOLUME CONTROL    MECHVT 560 mL   RATE 20 resp/min   PEEP 5 cm H20   pH, Arterial 7.59 (H) 7.35 - 7.45   pCO2 arterial 18 (LL) 32 - 48 mmHg    Comment: CRITICAL RESULT CALLED TO, READ BACK BY AND VERIFIED WITH: P.DOWD, RN AT 1714 ON 08/08/23 BY N.THOMPSON    pO2, Arterial 194 (H) 83 - 108 mmHg   Bicarbonate 16.9 (L) 20.0 - 28.0 mmol/L   Acid-base deficit 2.4 (H) 0.0 - 2.0 mmol/L   O2 Saturation 100 %   Patient temperature 36.4    Collection site RIGHT BRACHIAL    Drawn by [id number]     Comment: Performed at [facility], [address], [place], [place] [address]  Hepatitis B core antibody, total     Status: None   Collection Time: 08/08/23  6:00 PM  Result Value Ref Range   HEP B CORE AB Negative Negative    Comment: (NOTE) Performed At: [facility] [address] [place], [place] [id number] Pearlean Botts MD [id number]   Hepatitis B surface antibody,qualitative     Status: None   Collection Time: 08/08/23  6:00 PM  Result Value Ref Range   Hep B S Ab NON REACTIVE NON REACTIVE    Comment: (NOTE) Inconsistent with immunity, less than 10 mIU/mL.  Performed at [facility], [address] [place], [place] [address]   Hepatitis C antibody     Status: None   Collection Time: 08/08/23  6:00 PM  Result Value Ref Range   HCV Ab NON REACTIVE NON REACTIVE    Comment: (NOTE) Nonreactive HCV antibody screen is consistent with no HCV infections,  unless recent infection is suspected or other evidence exists to indicate HCV infection.  Performed at [facility], [address], [place], [place] [address]   Hepatitis A antibody, total     Status: Abnormal   Collection Time: 08/08/23  6:00 PM   Result Value Ref Range   hep A Total Ab Reactive (A) NON REACTIVE    Comment: Performed at [facility], [address], [place], [place] [address]  Hepatitis B surface antigen     Status: None   Collection Time: 08/08/23  6:01 PM  Result Value Ref Range   Hepatitis B Surface Ag NON REACTIVE NON REACTIVE    Comment: Performed at [facility], [address] [place], [place] [address]  MRSA Next Gen by PCR, Nasal     Status: None   Collection Time: 08/08/23  8:52 PM   Specimen: Nasal Mucosa; Nasal Swab  Result Value Ref Range   MRSA by PCR Next Gen NOT DETECTED NOT DETECTED    Comment: (NOTE) The GeneXpert MRSA Assay (FDA approved for NASAL specimens only), is one component of a comprehensive MRSA colonization surveillance program. It is not intended to diagnose MRSA infection nor to guide or monitor treatment for MRSA infections. Test performance is not FDA approved in patients less than 45 years old. Performed at [facility], [address] [place],  [place] [address]   Blood gas, arterial     Status: Abnormal   Collection Time: 08/08/23  9:00 PM  Result Value Ref Range   FIO2 80.0 %   Delivery systems VENTILATOR    Mode PRESSURE REGULATED VOLUME CONTROL    MECHVT 560 mL   RATE 14 resp/min   PEEP 5 cm H20   pH, Arterial 7.5 (H) 7.35 - 7.45   pCO2 arterial 25 (L) 32 - 48 mmHg   pO2, Arterial 305 (H) 83 - 108 mmHg   Bicarbonate 19.5 (L) 20.0 - 28.0 mmol/L   Acid-base deficit 2.1 (H) 0.0 - 2.0 mmol/L   O2 Saturation 99.9 %   Patient temperature 36.7    Collection site LEFT RADIAL    Drawn by [id number]    Allens test (pass/fail) PASS PASS    Comment: Performed at [facility], [address], [place], [place] [address]  Glucose, capillary     Status: Abnormal   Collection Time: 08/09/23 12:08 AM  Result Value Ref Range   Glucose-Capillary 128 (H) 70 - 99 mg/dL    Comment: Glucose reference range applies only to samples taken  after fasting for at least 8 hours.  CBC     Status: Abnormal   Collection Time: 08/09/23  2:51 AM  Result Value Ref Range   WBC 17.8 (H) 4.0 - 10.5 K/uL   RBC 2.56 (L) 4.22 - 5.81 MIL/uL   Hemoglobin 8.3 (L) 13.0 - 17.0 g/dL    Comment: REPEATED TO VERIFY POST TRANSFUSION SPECIMEN DELTA CHECK NOTED    HCT 23.7 (L) 39.0 - 52.0 %   MCV 92.6 80.0 - 100.0 fL    Comment: POST TRANSFUSION SPECIMEN REPEATED TO VERIFY DELTA CHECK NOTED    MCH 32.4 26.0 - 34.0 pg   MCHC 35.0 30.0 - 36.0 g/dL   RDW 13.0 (H) 86.5 - 78.4 %   Platelets 128 (L) 150 - 400 K/uL   nRBC 1.4 (H) 0.0 - 0.2 %    Comment: Performed at [facility], [address], [place], [place] [address]  Comprehensive metabolic panel     Status: Abnormal   Collection Time: 08/09/23  2:51 AM  Result Value Ref Range   Sodium 133 (L) 135 - 145 mmol/L   Potassium 2.4 (LL) 3.5 - 5.1 mmol/L    Comment: CRITICAL RESULT CALLED TO, READ BACK BY AND VERIFIED WITH HARRIS, D. RN AT 0327 ON 4.22.25. FA    Chloride 99 98 - 111 mmol/L   CO2 21 (L) 22 - 32 mmol/L   Glucose, Bld 112 (H) 70 - 99 mg/dL    Comment: Glucose reference range applies only to samples taken after fasting for at least 8 hours.   BUN 111 (H) 8 - 23 mg/dL    Comment: RESULT CONFIRMED BY MANUAL DILUTION   Creatinine, Ser 1.77 (H) 0.61 - 1.24 mg/dL   Calcium 7.5 (L) 8.9 - 10.3 mg/dL   Total Protein 6.1 (L) 6.5 - 8.1 g/dL   Albumin  1.6 (L) 3.5 - 5.0 g/dL   AST 528 (H) 15 - 41 U/L   ALT 58 (H) 0 - 44 U/L   Alkaline Phosphatase 62 38 - 126 U/L   Total Bilirubin 15.3 (H) 0.0 - 1.2 mg/dL   GFR, Estimated 41 (L) >60 mL/min    Comment: (NOTE) Calculated using the CKD-EPI Creatinine Equation (2021)    Anion gap 13 5 - 15    Comment: Performed at [facility], [address], [place], [place] [address]  Magnesium      Status: None   Collection Time: 08/09/23  2:52 AM  Result Value Ref Range   Magnesium  1.7 1.7 - 2.4 mg/dL     Comment: Performed at [facility], [address], [place], [place] [address]  Phosphorus     Status: None   Collection Time: 08/09/23  2:52 AM  Result Value Ref Range   Phosphorus 2.8 2.5 - 4.6 mg/dL    Comment: ICTERUS AT THIS LEVEL MAY AFFECT RESULT Performed at [facility], [address] [place], [place] [address]   Protime-INR     Status: Abnormal   Collection Time: 08/09/23  2:52 AM  Result Value Ref Range   Prothrombin Time 27.8 (H) 11.4 - 15.2 seconds   INR 2.6 (H) 0.8 - 1.2    Comment: (NOTE) INR goal varies based on device and disease states. Performed at [facility], [address], [place], [place] [address]   Glucose, capillary     Status: Abnormal   Collection Time: 08/09/23  3:41 AM  Result Value Ref Range   Glucose-Capillary 122 (H) 70 - 99 mg/dL    Comment: Glucose reference range applies only to samples taken after fasting for at least 8 hours.  Glucose, capillary     Status: None   Collection Time: 08/09/23  7:30 AM  Result Value Ref Range   Glucose-Capillary 99 70 - 99 mg/dL    Comment: Glucose reference range applies only to samples taken after fasting for at least 8 hours.  Hemoglobin and hematocrit, blood     Status: Abnormal   Collection Time: 08/09/23  8:35 AM  Result Value Ref Range   Hemoglobin 9.1 (L) 13.0 - 17.0 g/dL   HCT 10.2 (L) 72.5 - 36.6 %    Comment: Performed at [facility], [address] [place], [place] [address]    DG Chest Port 1 View Result Date: 08/08/2023 CLINICAL DATA:  Questionable sepsis - evaluate for abnormality EXAM: PORTABLE CHEST 1 VIEW COMPARISON:  None Available. FINDINGS: Overlying artifact limits assessment. Endotracheal tube tip is 4.4 cm from the carina. Tip of the enteric tube is below the diaphragm in the stomach, side-port just beyond the gastroesophageal junction. The heart is normal in size. Mediastinal contours are normal. There may  be small pleural effusions. No pulmonary edema, confluent airspace disease or pneumothorax. IMPRESSION: 1. Endotracheal tube tip 4.4 cm from the carina. 2. Enteric tube tip below the diaphragm in the stomach, side-port just beyond the gastroesophageal junction. 3. Possible small pleural effusions. Electronically Signed   By: Chadwick Colonel M.D.   On: 08/08/2023 18:33    ROS negative except above Blood pressure 119/66, pulse (!) 113, temperature 97.7 F (36.5 C), resp. rate 17, height 5\' 9"  (1.753 m), weight 51.6 kg, SpO2 100%. Physical Exam vital signs stable afebrile seems to answer yes and no questions appropriately abdomen is tender throughout without guarding or rebound maybe a little distended labs reviewed hemoglobin 3.4 on admission increased white count platelets 136 INR 2.6 BUN and creatinine significantly increased LFTs increased AST significantly greater than ALT  Assessment/Plan: Multiple medical problems including abnormal liver tests anemia guaiac positivity Plan: Will await CT scan first and will need GI workup at some point when stable and depending on when he will be extubated might consider EGD before extubation which would make it safer but let me know otherwise  I will check on tomorrow and call me sooner if signs of active bleeding or other GI question or problem  Qunisha Bryk E 08/09/2023, 9:18 AM

## 2023-08-09 NOTE — Progress Notes (Signed)
  Echocardiogram 2D Echocardiogram has been performed.  Annis Kinder, RDCS 08/09/2023, 9:59 AM

## 2023-08-10 ENCOUNTER — Inpatient Hospital Stay (HOSPITAL_COMMUNITY)

## 2023-08-10 DIAGNOSIS — G9341 Metabolic encephalopathy: Secondary | ICD-10-CM | POA: Diagnosis not present

## 2023-08-10 DIAGNOSIS — E43 Unspecified severe protein-calorie malnutrition: Secondary | ICD-10-CM | POA: Insufficient documentation

## 2023-08-10 DIAGNOSIS — K7291 Hepatic failure, unspecified with coma: Secondary | ICD-10-CM

## 2023-08-10 DIAGNOSIS — N179 Acute kidney failure, unspecified: Secondary | ICD-10-CM | POA: Diagnosis not present

## 2023-08-10 DIAGNOSIS — R188 Other ascites: Secondary | ICD-10-CM

## 2023-08-10 DIAGNOSIS — D649 Anemia, unspecified: Secondary | ICD-10-CM | POA: Diagnosis not present

## 2023-08-10 DIAGNOSIS — K7201 Acute and subacute hepatic failure with coma: Secondary | ICD-10-CM | POA: Diagnosis not present

## 2023-08-10 LAB — COMPREHENSIVE METABOLIC PANEL WITH GFR
ALT: 47 U/L — ABNORMAL HIGH (ref 0–44)
AST: 154 U/L — ABNORMAL HIGH (ref 15–41)
Albumin: 2.6 g/dL — ABNORMAL LOW (ref 3.5–5.0)
Alkaline Phosphatase: 48 U/L (ref 38–126)
Anion gap: 12 (ref 5–15)
BUN: 62 mg/dL — ABNORMAL HIGH (ref 8–23)
CO2: 20 mmol/L — ABNORMAL LOW (ref 22–32)
Calcium: 8 mg/dL — ABNORMAL LOW (ref 8.9–10.3)
Chloride: 104 mmol/L (ref 98–111)
Creatinine, Ser: 0.91 mg/dL (ref 0.61–1.24)
GFR, Estimated: >60 mL/min (ref >60)
Glucose, Bld: 136 mg/dL — ABNORMAL HIGH (ref 70–99)
Potassium: 3.7 mmol/L (ref 3.5–5.1)
Sodium: 136 mmol/L (ref 135–145)
Total Bilirubin: 16.9 mg/dL — ABNORMAL HIGH (ref 0.0–1.2)
Total Protein: 6.3 g/dL — ABNORMAL LOW (ref 6.5–8.1)

## 2023-08-10 LAB — BODY FLUID CELL COUNT WITH DIFFERENTIAL
Eos, Fluid: 0 %
Lymphs, Fluid: 6 %
Monocyte-Macrophage-Serous Fluid: 48 % — ABNORMAL LOW (ref 50–90)
Neutrophil Count, Fluid: 46 % — ABNORMAL HIGH (ref 0–25)
Total Nucleated Cell Count, Fluid: 153 uL (ref 0–1000)

## 2023-08-10 LAB — GLUCOSE, CAPILLARY
Glucose-Capillary: 120 mg/dL — ABNORMAL HIGH (ref 70–99)
Glucose-Capillary: 130 mg/dL — ABNORMAL HIGH (ref 70–99)
Glucose-Capillary: 133 mg/dL — ABNORMAL HIGH (ref 70–99)
Glucose-Capillary: 145 mg/dL — ABNORMAL HIGH (ref 70–99)
Glucose-Capillary: 147 mg/dL — ABNORMAL HIGH (ref 70–99)
Glucose-Capillary: 149 mg/dL — ABNORMAL HIGH (ref 70–99)

## 2023-08-10 LAB — HEMOGLOBIN AND HEMATOCRIT, BLOOD
HCT: 19.7 % — ABNORMAL LOW (ref 39.0–52.0)
HCT: 25.5 % — ABNORMAL LOW (ref 39.0–52.0)
HCT: 24.4 % — ABNORMAL LOW (ref 39.0–52.0)
Hemoglobin: 7 g/dL — ABNORMAL LOW (ref 13.0–17.0)
Hemoglobin: 8.5 g/dL — ABNORMAL LOW (ref 13.0–17.0)
Hemoglobin: 8.4 g/dL — ABNORMAL LOW (ref 13.0–17.0)

## 2023-08-10 LAB — CBC
HCT: 19.2 % — ABNORMAL LOW (ref 39.0–52.0)
Hemoglobin: 6.8 g/dL — CL (ref 13.0–17.0)
MCH: 32.9 pg (ref 26.0–34.0)
MCHC: 35.4 g/dL (ref 30.0–36.0)
MCV: 92.8 fL (ref 80.0–100.0)
Platelets: 88 10*3/uL — ABNORMAL LOW (ref 150–400)
RBC: 2.07 MIL/uL — ABNORMAL LOW (ref 4.22–5.81)
RDW: 20.6 % — ABNORMAL HIGH (ref 11.5–15.5)
WBC: 19.9 10*3/uL — ABNORMAL HIGH (ref 4.0–10.5)
nRBC: 1.1 % — ABNORMAL HIGH (ref 0.0–0.2)

## 2023-08-10 LAB — PROTIME-INR
INR: 2.8 — ABNORMAL HIGH (ref 0.8–1.2)
Prothrombin Time: 29.6 s — ABNORMAL HIGH (ref 11.4–15.2)

## 2023-08-10 LAB — ALBUMIN, PLEURAL OR PERITONEAL FLUID: Albumin, Fluid: <1.5 g/dL

## 2023-08-10 LAB — HEMOGLOBIN A1C
Hgb A1c MFr Bld: 4.7 % — ABNORMAL LOW (ref 4.8–5.6)
Mean Plasma Glucose: 88.19 mg/dL

## 2023-08-10 LAB — PROTEIN, PLEURAL OR PERITONEAL FLUID: Total protein, fluid: <3 g/dL

## 2023-08-10 LAB — PREPARE RBC (CROSSMATCH)

## 2023-08-10 LAB — LIPASE, BLOOD: Lipase: 679 U/L — ABNORMAL HIGH (ref 11–51)

## 2023-08-10 MED ORDER — ORAL CARE MOUTH RINSE: 15.0000 mL | OROMUCOSAL | Status: DC | PRN

## 2023-08-10 MED ORDER — SODIUM CHLORIDE (PF) 0.9 % IJ SOLN
INTRAMUSCULAR | Status: AC
  Administered 2023-08-10: 10 mL
  Filled 2023-08-10: qty 10

## 2023-08-10 MED ORDER — SIMETHICONE 40 MG/0.6ML PO SUSP
40.0000 mg | Freq: Four times a day (QID) | ORAL | Status: DC | PRN
  Administered 2023-08-10 – 2023-08-11 (×2): 40 mg via ORAL
  Filled 2023-08-10 (×3): qty 0.6

## 2023-08-10 MED ORDER — SODIUM CHLORIDE 0.9% IV SOLUTION: Freq: Once | INTRAVENOUS | Status: AC

## 2023-08-10 MED ORDER — MELATONIN 3 MG PO TABS
3.0000 mg | ORAL_TABLET | Freq: Once | ORAL | Status: AC
Start: 2023-08-10 — End: 2023-08-10
  Administered 2023-08-10: 3 mg via ORAL
  Filled 2023-08-10: qty 1

## 2023-08-10 MED ORDER — OXYCODONE HCL 5 MG PO TABS
10.0000 mg | ORAL_TABLET | ORAL | Status: DC | PRN
  Administered 2023-08-10 (×3): 10 mg via ORAL
  Filled 2023-08-10 (×4): qty 2

## 2023-08-10 NOTE — Procedures (Signed)
 Paracentesis Procedure Note  WALDEMAR SIEGEL  [id number]  1950-11-09  Date:08/10/23  Time:12:09 PM   Provider Performing:Daizee Firmin D Marlys Singh    Procedure: Paracentesis with imaging guidance ([id number])  Indication(s) Ascites  Consent Risks of the procedure as well as the alternatives and risks of each were explained to the patient and/or caregiver.  Consent for the procedure was obtained and is signed in the bedside chart  Anesthesia Topical only with 1% lidocaine    Time Out Verified patient identification, verified procedure, site/side was marked, verified correct patient position, special equipment/implants available, medications/allergies/relevant history reviewed, required imaging and test results available.   Sterile Technique Maximal sterile technique including full sterile barrier drape, hand hygiene, sterile gown, sterile gloves, mask, hair covering, sterile ultrasound probe cover (if used).   Procedure Description Ultrasound used to identify appropriate peritoneal anatomy for placement and overlying skin marked.  Area of drainage cleaned and draped in sterile fashion. Lidocaine was used to anesthetize the skin and subcutaneous tissue.  8 cc's of yellow appearing fluid was drained. Catheter then removed and bandaid applied to site.   Complications/Tolerance None; patient tolerated the procedure well.   EBL Minimal   Specimen(s) Peritoneal fluid  JD Carliss Chess [facility] 08/10/2023, 12:10 PM  Please see [email or web address] for pager details.  From 7A-7P if no response, please call [phone]. After hours, please call ELink [phone].

## 2023-08-10 NOTE — Progress Notes (Signed)
 Kevin Velez 11:28 AM  Subjective: Patient still intubated this morning but plans to extubate him later today and he is more alert today and denies much aspirin or nonsteroidals at home but has been back to drinking and his belly pain is not as bad and he has no signs of bleeding  Objective: Vital signs stable afebrile no acute distress still on pressors low-dose abdomen is mildly tender throughout CT reviewed labs reviewed lipase pending  Assessment: Multiple medical problems including cirrhosis guaiac positive anemia  Plan: Patient would probably benefit from a diagnostic paracentesis to rule out SBP and confirm transudate and at some point when stable and endoscopy to start his anemia workup and okay with me to extubate and will check on tomorrow and if paracentesis done would get a cell count and differential ,albumin , cytology and culture and lipase and any other studies you think is important  [facility] E  office [phone] After 5PM or if no answer call [phone]

## 2023-08-10 NOTE — Progress Notes (Signed)
RT transported pt to and from CT without event.

## 2023-08-10 NOTE — Plan of Care (Signed)
  Problem: Coping: Goal: Ability to adjust to condition or change in health will improve Outcome: Progressing   Problem: Metabolic: Goal: Ability to maintain appropriate glucose levels will improve Outcome: Progressing   Problem: Health Behavior/Discharge Planning: Goal: Ability to manage health-related needs will improve Outcome: Progressing   Problem: Clinical Measurements: Goal: Respiratory complications will improve Outcome: Progressing   Problem: Elimination: Goal: Will not experience complications related to bowel motility Outcome: Progressing   Problem: Nutrition: Goal: Adequate nutrition will be maintained Outcome: Not Progressing   Problem: Pain Managment: Goal: General experience of comfort will improve and/or be controlled Outcome: Not Progressing

## 2023-08-10 NOTE — Progress Notes (Addendum)
 NAME:  TERRI MALERBA, MRN:  [id number], DOB:  10/06/1950, LOS: 2 ADMISSION DATE:  08/08/2023, CONSULTATION DATE:  08/10/23 REFERRING MD:  ED, CHIEF COMPLAINT:  AMS   History of Present Illness:  73 year old man minimal past medical history or unknown past medical history lives in a group home, presents via EMS after several days of worsening lethargy, inability go to bed, weakness, found to be hypotensive improving with fluids with subsequent discovery of anemia hemoglobin of 3, acute liver failure with encephalopathy elevated INR and elevated LFTs, acute renal failure intubated in the ED due to inability to protect airway.   Discussed with mother and sister at bedside.  He was last physically seen on Thursday.  Was standing but appeared weak.  A couple days ago spoke over the phone and he was a bit confused.  Progressive weakness at the group home.  The person in charge of the home was concerned prompting EMS being called.  Hypotension on arrival to group home.  Improved with fluids.  Vitals relatively normal on arrival.  He is encephalopathic got intubated.  Labs revealed liver failure renal failure hemoglobin of 3 with positive occult blood.  Blood cultures were obtained.  Antibiotics were started.  GI was consulted.  No note as of yet.  I requested the reach of the transplant center as a phone note liver failure of unknown etiology.  [facility] unable to accommodate transfer given lack of beds.  Decision was made to admit here to continue treatment and workup.  Pertinent  Medical History  Unknown, no significant history  Significant Hospital Events: Including procedures, antibiotic start and stop dates in addition to other pertinent events   4/21 [facility] after EMS called, progressive lethargy, not been out of bed, found to be hypotensive and acute renal and liver failure  Interim History / Subjective:  NAEON, purposeful movements, alert communicative.  Indicated that he had  returned to drinking and was drinking more than normal.  I started Methylpred given concern for alcoholic hepatitis given no other explanation of lab findings, ultrasound demonstrated cirrhosis.  Kidney function continues to improve.  He remains off continuous sedation and appears comfortable in the ventilator.  Doing well on pressure support ventilation.  Objective   Blood pressure (!) 97/59, pulse 98, temperature 97.9 F (36.6 C), resp. rate 19, height 5\' 9"  (1.753 m), weight 54.9 kg, SpO2 99%.    Vent Mode: CPAP;PSV FiO2 (%):  [30 %] 30 % Set Rate:  [14 bmp] 14 bmp Vt Set:  [560 mL] 560 mL PEEP:  [5 cmH20] 5 cmH20 Pressure Support:  [5 cmH20-12 cmH20] 5 cmH20 Plateau Pressure:  [8 cmH20-17 cmH20] 8 cmH20   Intake/Output Summary (Last 24 hours) at 08/10/2023 0954 Last data filed at 08/10/2023 0600 Gross per 24 hour  Intake 3189.01 ml  Output 1400 ml  Net 1789.01 ml   Filed Weights   08/08/23 2100 08/09/23 0500 08/10/23 0247  Weight: 51.6 kg 51.6 kg 54.9 kg    Examination: General: Chronically ill-appearing, lying in bed HENT: Atraumatic normocephalic Lungs: Coarse ventilated sounds otherwise clear Cardiovascular: Regular rate and rhythm, no murmur no edema Abdomen: Nondistended, nontender Neuro: Intubated, although no continuous sedative.  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  Meets all criteria.  Pattern is more cholestatic.  Ultrasound reveals underlying cirrhosis with  ascites no biliary ductal dilatation etc.  He indicated he had returned to drinking and was drinking fairly heavily with yes no nodding on the ventilator.  Madrey discriminant function elevated. -- Discontinue NAC infusion, Tylenol  level undetectable -- Methylprednisolone started 4/22 given elevated Madrey discriminant  function score over 100 -- GI consultation appreciated -- Meld Na 39, 66% estimated 90 day mortality  Possible severe sepsis/septic shock: With encephalopathy low blood pressure renal failure. -- Status post IV fluid resuscitation -- Empiric Zosyn  as above -- Follow-up blood culture data, UA mild pyuria negative LE and nitrites  Acute renal failure: Elevated creatinine.  Suspect hypovolemic given exam.  Has improved and nearly normalized with ongoing fluids. --CTM  Elevated bilirubin: Obstruction not apparent on ultrasound.  Concern for alcoholic hepatitis as above. -- Trend  Severe anemia, presumed acute blood loss: Fecal occult test positive. -- addl 1 u pRBC 4/23 -- PPI IV twice daily -- GI consult as above, appreciate assistance, after discussion ok to extubate and serial evaluation for need of scope inpatient  Elevated INR: 3.0 on admission, presumably related to liver failure -- s/p IV Vit K 10 mg, repeat INR 2.6  Metabolic encephalopathy: Presumably related to sepsis and liver failure.  He arouses and moves all extremities to noxious stimuli. 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: -- PRVC, plan extubate if passes SBT -- Stress ulcer prophylaxis, VAP bundle -- PEEP minimal, wean FiO2 based on O2 sats, if greater 95% can wean FiO2 gradually  Best Practice (right click and "Reselect all SmartList Selections" daily)   Diet/type: NPO DVT prophylaxis prophylactic heparin  Pressure ulcer(s): N/A GI prophylaxis: H2B Lines: Central line Foley:  Yes, and it is still needed Code Status:  full code Last date of multidisciplinary goals of care discussion [discussed with brother and sister at bedside on admission, deferred full code]  Labs   CBC: Recent Labs  Lab  08/08/23 1336 08/08/23 1610 08/09/23 0251 08/09/23 0835 08/09/23 1538 08/10/23 0252 08/10/23 0537  WBC 16.2* 13.7* 17.8*  --   --   --  19.9*  NEUTROABS 11.9* 10.7*  --   --   --   --   --   HGB 3.3* 3.4* 8.3* 9.1* 7.6* 7.0* 6.8*  HCT 9.7* 9.7* 23.7* 26.4* 20.7* 19.7* 19.2*  MCV 109.0* 107.8* 92.6  --   --   --  92.8  PLT 211 136* 128*  --   --   --  88*    Basic Metabolic Panel: Recent Labs  Lab 08/08/23 1336 08/09/23 0251 08/09/23 0252 08/09/23 2216 08/10/23 0537  NA 135 133*  --  138 136  K 3.9 2.4*  --  3.3* 3.7  CL 99 99  --  106 104  CO2 16* 21*  --  21* 20*  GLUCOSE 123* 112*  --  97 136*  BUN 145* 111*  --  71* 62*  CREATININE 2.68* 1.77*  --  1.16 0.91  CALCIUM  8.4* 7.5*  --  7.8* 8.0*  MG  --   --  1.7  --   --   PHOS  --   --  2.8  --   --    GFR: Estimated Creatinine Clearance: 57.8 mL/min (by C-G formula based on SCr of 0.91 mg/dL). Recent Labs  Lab 08/08/23 1336 08/08/23 1417 08/08/23 1610 08/08/23 1624 08/09/23 0251 08/10/23 0537  WBC 16.2*  --  13.7*  --  17.8* 19.9*  LATICACIDVEN  --  7.2*  --  6.7*  --   --     Liver Function Tests: Recent Labs  Lab 08/08/23 1336 08/09/23 0251 08/10/23 0537  AST 236* 201* 154*  ALT 63* 58* 47*  ALKPHOS 67 62 48  BILITOT 15.8* 15.3* 16.9*  PROT 6.6 6.1* 6.3*  ALBUMIN  1.8* 1.6* 2.6*   No results for input(s): "LIPASE", "AMYLASE" in the last 168 hours. Recent Labs  Lab 08/08/23 1420  AMMONIA 122*    ABG    Component Value Date/Time   PHART 7.5 (H) 08/08/2023 2100   PCO2ART 25 (L) 08/08/2023 2100   PO2ART 305 (H) 08/08/2023 2100   HCO3 19.5 (L) 08/08/2023 2100   TCO2 31 03/20/2007 1527   ACIDBASEDEF 2.1 (H) 08/08/2023 2100   O2SAT 99.9 08/08/2023 2100     Coagulation Profile: Recent Labs  Lab 08/08/23 1336 08/09/23 0252 08/10/23 0537  INR 3.0* 2.6* 2.8*    Cardiac Enzymes: Recent Labs  Lab 08/08/23 1610  CKTOTAL 14*    HbA1C: No results found for: "HGBA1C"  CBG: Recent  Labs  Lab 08/09/23 1518 08/09/23 2007 08/09/23 2318 08/10/23 0424 08/10/23 0745  GLUCAP 85 79 88 120* 133*    Review of Systems:   Unable to obtain, intubated  Past Medical History:  He,  has no past medical history on file.   Surgical History:  History reviewed. No pertinent surgical history.   Social History:   reports that he has been smoking. He has never used smokeless tobacco.   Family History:  His family history is not on file.   Allergies No Known Allergies   Home Medications  Prior to Admission medications   Medication Sig Start Date End Date Taking? Authorizing Provider  capsicum (ZOSTRIX) 0.075 % topical cream Apply 1 application topically 2 (two) times daily as needed. 11/13/19   Afton Albright, MD  HYDROcodone -acetaminophen  (NORCO/VICODIN) 5-325 MG tablet Take 1 tablet by mouth every 6 (six) hours as needed for moderate pain or severe pain. 11/13/19   Afton Albright, MD  valACYclovir  (VALTREX ) 1000 MG tablet Take 1 tablet (1,000 mg total) by mouth 3 (three) times daily. 11/13/19   Afton Albright, MD     Critical care time:     CRITICAL CARE Performed by: Guerry Leek   Total critical care time: 32 minutes  Critical care time was exclusive of separately billable procedures and treating other patients.  Critical care was necessary to treat or prevent imminent or life-threatening deterioration.  Critical care was time spent personally by me on the following activities: development of treatment plan with patient and/or surrogate as well as nursing, discussions with consultants, evaluation of patient's response to treatment, examination of patient, obtaining history from patient or surrogate, ordering and performing treatments and interventions, ordering and review of laboratory studies, ordering and review of radiographic studies, pulse oximetry and re-evaluation of patient's condition.   Guerry Leek, MD See Amion If no response please contact  on-call pager until 7 PM, after 7 PM until 7 AM please contact E-link

## 2023-08-10 NOTE — Procedures (Signed)
 Extubation Procedure Note  Patient Details:   Name: Kevin Velez DOB: Mar 18, 1951 MRN: [id number]   Airway Documentation:    Vent end date: 08/10/23 Vent end time: 1100   Evaluation  O2 sats: stable throughout Complications: No apparent complications Patient did tolerate procedure well. Bilateral Breath Sounds: Clear, Diminished   Patient extubated per order. Positive cuff leak noted. Patient placed on 2L [place]. No stridor or dyspnea noted. Patient spoke stated name when requested. No signs of distress, will continue to monitor patient.   Tania Steinhauser 08/10/2023, 11:11 AM

## 2023-08-10 NOTE — TOC Progression Note (Signed)
 Transition of Care [facility]) - Progression Note   Patient Details  Name: Kevin Velez MRN: [id number] Date of Birth: 1951-01-21  [facility] CM/SW Contact  Zenon Hilda, LCSW Phone Number: 08/10/2023, 1:12 PM  Clinical Narrative: Patient has been extubated. CSW met with patient and brother, Kevin Velez, to discuss discharge planning. Patient reported he lives in a home, which he referred to as a "group home" but he actually rents a room from the landlord. TOC to follow for discharge needs.  Expected Discharge Plan: Home/Self Care Barriers to Discharge: Continued Medical Work up  Expected Discharge Plan and Services In-house Referral: Clinical Social Work Living arrangements for the past 2 months: Single Family Home  Social Determinants of Health (SDOH) Interventions SDOH Screenings   Food Insecurity: Patient Unable To Answer (08/09/2023)  Housing: Patient Unable To Answer (08/09/2023)  Transportation Needs: Patient Unable To Answer (08/09/2023)  Utilities: Patient Unable To Answer (08/09/2023)  Social Connections: Patient Unable To Answer (08/09/2023)  Tobacco Use: High Risk (08/09/2023)   Readmission Risk Interventions     No data to display

## 2023-08-10 NOTE — Plan of Care (Signed)
  Problem: Coping: Goal: Ability to adjust to condition or change in health will improve Outcome: Progressing   Problem: Fluid Volume: Goal: Ability to maintain a balanced intake and output will improve Outcome: Progressing   Problem: Metabolic: Goal: Ability to maintain appropriate glucose levels will improve Outcome: Progressing   Problem: Skin Integrity: Goal: Risk for impaired skin integrity will decrease Outcome: Progressing   Problem: Tissue Perfusion: Goal: Adequacy of tissue perfusion will improve Outcome: Progressing   Problem: Education: Goal: Knowledge of General Education information will improve Description: Including pain rating scale, medication(s)/side effects and non-pharmacologic comfort measures Outcome: Progressing   Problem: Health Behavior/Discharge Planning: Goal: Ability to manage health-related needs will improve Outcome: Progressing   Problem: Clinical Measurements: Goal: Ability to maintain clinical measurements within normal limits will improve Outcome: Progressing Goal: Will remain free from infection Outcome: Progressing Goal: Diagnostic test results will improve Outcome: Progressing Goal: Respiratory complications will improve Outcome: Progressing Goal: Cardiovascular complication will be avoided Outcome: Progressing   Problem: Activity: Goal: Risk for activity intolerance will decrease Outcome: Progressing   Problem: Coping: Goal: Level of anxiety will decrease Outcome: Progressing   Problem: Elimination: Goal: Will not experience complications related to bowel motility Outcome: Progressing Goal: Will not experience complications related to urinary retention Outcome: Progressing   Problem: Pain Managment: Goal: General experience of comfort will improve and/or be controlled Outcome: Progressing   Problem: Safety: Goal: Ability to remain free from injury will improve Outcome: Progressing   Problem: Skin Integrity: Goal:  Risk for impaired skin integrity will decrease Outcome: Progressing   Problem: Safety: Goal: Non-violent Restraint(s) Outcome: Progressing   Problem: Health Behavior/Discharge Planning: Goal: Ability to manage health-related needs will improve Outcome: Not Progressing   Problem: Nutritional: Goal: Maintenance of adequate nutrition will improve Outcome: Not Progressing Goal: Progress toward achieving an optimal weight will improve Outcome: Not Progressing   Problem: Nutrition: Goal: Adequate nutrition will be maintained Outcome: Not Progressing

## 2023-08-11 DIAGNOSIS — R188 Other ascites: Secondary | ICD-10-CM | POA: Diagnosis not present

## 2023-08-11 DIAGNOSIS — G9341 Metabolic encephalopathy: Secondary | ICD-10-CM | POA: Diagnosis not present

## 2023-08-11 DIAGNOSIS — K7201 Acute and subacute hepatic failure with coma: Secondary | ICD-10-CM | POA: Diagnosis not present

## 2023-08-11 DIAGNOSIS — D649 Anemia, unspecified: Secondary | ICD-10-CM | POA: Diagnosis not present

## 2023-08-11 LAB — GLUCOSE, CAPILLARY
Glucose-Capillary: 141 mg/dL — ABNORMAL HIGH (ref 70–99)
Glucose-Capillary: 127 mg/dL — ABNORMAL HIGH (ref 70–99)
Glucose-Capillary: 143 mg/dL — ABNORMAL HIGH (ref 70–99)
Glucose-Capillary: 133 mg/dL — ABNORMAL HIGH (ref 70–99)
Glucose-Capillary: 153 mg/dL — ABNORMAL HIGH (ref 70–99)

## 2023-08-11 LAB — TYPE AND SCREEN
ABO/RH(D): O POS
Antibody Screen: NEGATIVE
Unit division: 0
Unit division: 0
Unit division: 0

## 2023-08-11 LAB — BPAM RBC
Blood Product Expiration Date: 202505212359
Blood Product Expiration Date: 202505222359
Blood Product Expiration Date: 202505222359
ISSUE DATE / TIME: 202504231127
ISSUE DATE / TIME: 202504211825
ISSUE DATE / TIME: 202504212109
Unit Type and Rh: 5100
Unit Type and Rh: 5100
Unit Type and Rh: 5100

## 2023-08-11 LAB — COMPREHENSIVE METABOLIC PANEL WITH GFR
ALT: 53 U/L — ABNORMAL HIGH (ref 0–44)
AST: 142 U/L — ABNORMAL HIGH (ref 15–41)
Albumin: 2.5 g/dL — ABNORMAL LOW (ref 3.5–5.0)
Alkaline Phosphatase: 52 U/L (ref 38–126)
Anion gap: 11 (ref 5–15)
BUN: 50 mg/dL — ABNORMAL HIGH (ref 8–23)
CO2: 19 mmol/L — ABNORMAL LOW (ref 22–32)
Calcium: 7.9 mg/dL — ABNORMAL LOW (ref 8.9–10.3)
Chloride: 105 mmol/L (ref 98–111)
Creatinine, Ser: 0.84 mg/dL (ref 0.61–1.24)
GFR, Estimated: >60 mL/min (ref >60)
Glucose, Bld: 140 mg/dL — ABNORMAL HIGH (ref 70–99)
Potassium: 3.5 mmol/L (ref 3.5–5.1)
Sodium: 135 mmol/L (ref 135–145)
Total Bilirubin: 17.1 mg/dL — ABNORMAL HIGH (ref 0.0–1.2)
Total Protein: 6.3 g/dL — ABNORMAL LOW (ref 6.5–8.1)

## 2023-08-11 LAB — CBC
HCT: 23.7 % — ABNORMAL LOW (ref 39.0–52.0)
Hemoglobin: 8.3 g/dL — ABNORMAL LOW (ref 13.0–17.0)
MCH: 31.6 pg (ref 26.0–34.0)
MCHC: 35 g/dL (ref 30.0–36.0)
MCV: 90.1 fL (ref 80.0–100.0)
Platelets: 85 10*3/uL — ABNORMAL LOW (ref 150–400)
RBC: 2.63 MIL/uL — ABNORMAL LOW (ref 4.22–5.81)
RDW: 20.5 % — ABNORMAL HIGH (ref 11.5–15.5)
WBC: 19.9 10*3/uL — ABNORMAL HIGH (ref 4.0–10.5)
nRBC: 2.2 % — ABNORMAL HIGH (ref 0.0–0.2)

## 2023-08-11 LAB — PROTIME-INR
INR: 2.2 — ABNORMAL HIGH (ref 0.8–1.2)
Prothrombin Time: 25.1 s — ABNORMAL HIGH (ref 11.4–15.2)

## 2023-08-11 MED ORDER — MIDODRINE HCL 5 MG PO TABS
10.0000 mg | ORAL_TABLET | Freq: Three times a day (TID) | ORAL | Status: DC
  Administered 2023-08-11 – 2023-08-20 (×17): 10 mg via ORAL
  Filled 2023-08-11 (×18): qty 2

## 2023-08-11 MED ORDER — PREDNISOLONE 5 MG PO TABS
40.0000 mg | ORAL_TABLET | Freq: Every day | ORAL | Status: DC
  Administered 2023-08-12 – 2023-08-20 (×8): 40 mg via ORAL
  Filled 2023-08-11 (×8): qty 8

## 2023-08-11 MED ORDER — PANCRELIPASE (LIP-PROT-AMYL) 36000-114000 UNITS PO CPEP
36000.0000 [IU] | ORAL_CAPSULE | Freq: Three times a day (TID) | ORAL | Status: DC
  Administered 2023-08-11 – 2023-08-16 (×10): 36000 [IU] via ORAL
  Filled 2023-08-11 (×19): qty 1

## 2023-08-11 MED ORDER — INSULIN ASPART 100 UNIT/ML IJ SOLN
0.0000 [IU] | Freq: Three times a day (TID) | INTRAMUSCULAR | Status: DC
  Administered 2023-08-13 – 2023-08-19 (×4): 1 [IU] via SUBCUTANEOUS

## 2023-08-11 MED ORDER — SODIUM CHLORIDE (PF) 0.9 % IJ SOLN
INTRAMUSCULAR | Status: AC
Start: 2023-08-11 — End: 2023-08-11
  Administered 2023-08-11: 10 mL
  Filled 2023-08-11: qty 10

## 2023-08-11 MED ORDER — ADULT MULTIVITAMIN W/MINERALS CH
1.0000 | ORAL_TABLET | Freq: Every day | ORAL | Status: DC
  Administered 2023-08-12 – 2023-08-20 (×7): 1 via ORAL
  Filled 2023-08-11 (×8): qty 1

## 2023-08-11 MED ORDER — ENSURE ENLIVE PO LIQD
237.0000 mL | Freq: Two times a day (BID) | ORAL | Status: DC
  Administered 2023-08-11 – 2023-08-13 (×5): 237 mL via ORAL

## 2023-08-11 NOTE — Progress Notes (Signed)
 NAME:  Kevin Velez, MRN:  [id number], DOB:  20-Feb-1951, LOS: 3 ADMISSION DATE:  08/08/2023, CONSULTATION DATE:  08/11/23 REFERRING MD:  ED, CHIEF COMPLAINT:  AMS   History of Present Illness:  72 year old man minimal past medical history or unknown past medical history lives in a group home, presents via EMS after several days of worsening lethargy, inability go to bed, weakness, found to be hypotensive improving with fluids with subsequent discovery of anemia hemoglobin of 3, acute liver failure with encephalopathy elevated INR and elevated LFTs, acute renal failure intubated in the ED due to inability to protect airway.   Discussed with mother and sister at bedside.  He was last physically seen on Thursday.  Was standing but appeared weak.  A couple days ago spoke over the phone and he was a bit confused.  Progressive weakness at the group home.  The person in charge of the home was concerned prompting EMS being called.  Hypotension on arrival to group home.  Improved with fluids.  Vitals relatively normal on arrival.  He is encephalopathic got intubated.  Labs revealed liver failure renal failure hemoglobin of 3 with positive occult blood.  Blood cultures were obtained.  Antibiotics were started.  GI was consulted.  No note as of yet.  I requested the reach of the transplant center as a phone note liver failure of unknown etiology.  [facility] unable to accommodate transfer given lack of beds.  Decision was made to admit here to continue treatment and workup.  Pertinent  Medical History  Unknown, no significant history  Significant Hospital Events: Including procedures, antibiotic start and stop dates in addition to other pertinent events   4/21 [facility] to [facility] after EMS called, progressive lethargy, not been out of bed, found to be hypotensive and acute renal and liver failure 4/22 prednisolone  for presumed alc hep 4/23 extubated  Interim History / Subjective:  NAEON, extubated yday.  Low dose pressors but MAP ok, NE dc'd. Midodrine  started. Hgb stable. Bedside US  small ascites, sample sent not consistent with SBP.  Objective   Blood pressure 103/64, pulse 99, temperature (!) 97.3 F (36.3 C), resp. rate (!) 8, height 5\' 9"  (1.753 m), weight 53.1 kg, SpO2 100%.    Vent Mode: CPAP;PSV FiO2 (%):  [30 %] 30 % PEEP:  [5 cmH20] 5 cmH20 Pressure Support:  [5 cmH20] 5 cmH20   Intake/Output Summary (Last 24 hours) at 08/11/2023 4098 Last data filed at 08/11/2023 0602 Gross per 24 hour  Intake 1252.5 ml  Output 1350 ml  Net -97.5 ml   Filed Weights   08/09/23 0500 08/10/23 0247 08/11/23 0500  Weight: 51.6 kg 54.9 kg 53.1 kg    Examination: General: Chronically ill-appearing, lying in bed HENT: Atraumatic normocephalic Lungs: Coarse ventilated sounds otherwise clear Cardiovascular: Regular rate and rhythm, no murmur no edema Abdomen: Nondistended, nontender Neuro: alert, hungry   Resolved Hospital Problem list     Assessment & Plan:  Fulminant liver failure, 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  Meets all criteria.  Pattern is more cholestatic.  Ultrasound reveals underlying cirrhosis with ascites no biliary ductal dilatation etc.  He indicated he had returned to drinking and was drinking fairly heavily with yes no nodding on the ventilator.  Madrey discriminant function elevated. -- Discontinue NAC infusion, Tylenol  level undetectable -- Methylprednisolone started 4/22 given elevated Madrey discriminant function score 102 -- GI consultation appreciated -- Meld Na 39 on admission, 66%  estimated 90 day mortality  Possible severe sepsis/septic shock: With encephalopathy low blood pressure renal failure.  -- Status post IV fluid resuscitation -- Empiric Zosyn  as above x 5 days -- Follow-up blood culture data NGTD, UA mild pyuria negative LE and nitrites  Acute renal failure: Resolved. Elevated  creatinine.  Suspect hypovolemic given exam.  Has improved and nearly normalized with ongoing fluids.  Elevated bilirubin: Obstruction not apparent on ultrasound.  Concern for alcoholic hepatitis as above. -- Trend, continues to rise slowly  Severe anemia, presumed acute blood loss: Fecal occult test positive with melenic stools ongoing. 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 -- addl 1 u pRBC 4/23 -- PPI IV twice daily -- GI consult as above, appreciate assistance, EGD at their discretion  Elevated INR: 3.0 on admission, presumably related to liver failure -- s/p IV Vit K 10 mg on admission, repeat INR 2.2  Metabolic encephalopathy: Presumably related to sepsis and liver failure.  He arouses and moves all extremities to noxious stimuli. 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 -- extubated 4/23  Best Practice (right click and "Reselect all SmartList Selections" daily)   Diet/type: NPO DVT prophylaxis prophylactic heparin  Pressure ulcer(s): N/A GI prophylaxis: H2B Lines: Central line Foley:  Yes, and it is still needed Code Status:  full code Last date of multidisciplinary goals of care discussion [discussed with brother and sister at bedside on admission, deferred full code]  Labs   CBC: Recent Labs  Lab 08/08/23 1336 08/08/23 1610 08/09/23 0251 08/09/23 0835 08/10/23 0252 08/10/23 0537 08/10/23 1556 08/10/23 2156 08/11/23 0534  WBC 16.2* 13.7* 17.8*  --   --  19.9*  --   --  19.9*  NEUTROABS 11.9* 10.7*  --   --   --   --   --   --   --   HGB 3.3* 3.4* 8.3*   < > 7.0* 6.8* 8.5* 8.4* 8.3*  HCT 9.7* 9.7* 23.7*   < > 19.7* 19.2* 25.5* 24.4* 23.7*  MCV 109.0* 107.8* 92.6  --   --  92.8  --   --  90.1  PLT 211 136* 128*  --   --  88*  --   --  85*    < > = values in this interval not displayed.    Basic Metabolic Panel: Recent Labs  Lab 08/08/23 1336 08/09/23 0251 08/09/23 0252 08/09/23 2216 08/10/23 0537 08/11/23 0534  NA 135 133*  --  138 136 135  K 3.9 2.4*  --  3.3* 3.7 3.5  CL 99 99  --  106 104 105  CO2 16* 21*  --  21* 20* 19*  GLUCOSE 123* 112*  --  97 136* 140*  BUN 145* 111*  --  71* 62* 50*  CREATININE 2.68* 1.77*  --  1.16 0.91 0.84  CALCIUM  8.4* 7.5*  --  7.8* 8.0* 7.9*  MG  --   --  1.7  --   --   --   PHOS  --   --  2.8  --   --   --    GFR: Estimated Creatinine Clearance: 60.6 mL/min (by C-G formula based on SCr of 0.84 mg/dL). Recent Labs  Lab 08/08/23 1417 08/08/23 1610 08/08/23 1624 08/09/23 0251 08/10/23 0537 08/11/23  0534  WBC  --  13.7*  --  17.8* 19.9* 19.9*  LATICACIDVEN 7.2*  --  6.7*  --   --   --     Liver Function Tests: Recent Labs  Lab 08/08/23 1336 08/09/23 0251 08/10/23 0537 08/11/23 0534  AST 236* 201* 154* 142*  ALT 63* 58* 47* 53*  ALKPHOS 67 62 48 52  BILITOT 15.8* 15.3* 16.9* 17.1*  PROT 6.6 6.1* 6.3* 6.3*  ALBUMIN  1.8* 1.6* 2.6* 2.5*   Recent Labs  Lab 08/10/23 0537  LIPASE 679*   Recent Labs  Lab 08/08/23 1420  AMMONIA 122*    ABG    Component Value Date/Time   PHART 7.5 (H) 08/08/2023 2100   PCO2ART 25 (L) 08/08/2023 2100   PO2ART 305 (H) 08/08/2023 2100   HCO3 19.5 (L) 08/08/2023 2100   TCO2 31 03/20/2007 1527   ACIDBASEDEF 2.1 (H) 08/08/2023 2100   O2SAT 99.9 08/08/2023 2100     Coagulation Profile: Recent Labs  Lab 08/08/23 1336 08/09/23 0252 08/10/23 0537 08/11/23 0534  INR 3.0* 2.6* 2.8* 2.2*    Cardiac Enzymes: Recent Labs  Lab 08/08/23 1610  CKTOTAL 14*    HbA1C: Hgb A1c MFr Bld  Date/Time Value Ref Range Status  08/10/2023 09:56 PM 4.7 (L) 4.8 - 5.6 % Final    Comment:    (NOTE) Pre diabetes:          5.7%-6.4%  Diabetes:              >6.4%  Glycemic control for   <7.0% adults with diabetes   08/08/2023 04:10 PM  QNSTST 4.8 - 5.6 % Final    Comment:    (NOTE) Test not performed. Insufficient specimen to perform or complete analysis. Montie Apley notified 08/10/2023-[place]         Prediabetes: 5.7 - 6.4         Diabetes: >6.4         Glycemic control for adults with diabetes: <7.0     CBG: Recent Labs  Lab 08/10/23 1523 08/10/23 1924 08/10/23 2348 08/11/23 0358 08/11/23 0750  GLUCAP 145* 149* 147* 141* 127*    Review of Systems:   Unable to obtain, intubated  Past Medical History:  He,  has no past medical history on file.   Surgical History:  History reviewed. No pertinent surgical history.   Social History:   reports that he has been smoking. He has never used smokeless tobacco.   Family History:  His family history is not on file.   Allergies No Known Allergies   Home Medications  Prior to Admission medications   Medication Sig Start Date End Date Taking? Authorizing Provider  capsicum (ZOSTRIX) 0.075 % topical cream Apply 1 application topically 2 (two) times daily as needed. 11/13/19   Afton Albright, MD  HYDROcodone -acetaminophen  (NORCO/VICODIN) 5-325 MG tablet Take 1 tablet by mouth every 6 (six) hours as needed for moderate pain or severe pain. 11/13/19   Afton Albright, MD  valACYclovir  (VALTREX ) 1000 MG tablet Take 1 tablet (1,000 mg total) by mouth 3 (three) times daily. 11/13/19   Afton Albright, MD     Critical care time:     CRITICAL CARE Performed by: Guerry Leek   Total critical care time: 33 minutes  Critical care time was exclusive of separately billable procedures and treating other patients.  Critical care was necessary to treat or prevent imminent or life-threatening deterioration.  Critical care was time spent personally by me on  the following activities: development of treatment plan with patient and/or surrogate as well as nursing, discussions with consultants, evaluation of patient's response to treatment, examination of patient,  obtaining history from patient or surrogate, ordering and performing treatments and interventions, ordering and review of laboratory studies, ordering and review of radiographic studies, pulse oximetry and re-evaluation of patient's condition.   Guerry Leek, MD See Amion If no response please contact on-call pager until 7 PM, after 7 PM until 7 AM please contact E-link

## 2023-08-11 NOTE — Plan of Care (Signed)
  Problem: Education: Goal: Ability to describe self-care measures that may prevent or decrease complications (Diabetes Survival Skills Education) will improve Outcome: Progressing Goal: Individualized Educational Video(s) Outcome: Progressing   Problem: Coping: Goal: Ability to adjust to condition or change in health will improve Outcome: Progressing   Problem: Health Behavior/Discharge Planning: Goal: Ability to identify and utilize available resources and services will improve Outcome: Progressing Goal: Ability to manage health-related needs will improve Outcome: Progressing   Problem: Metabolic: Goal: Ability to maintain appropriate glucose levels will improve Outcome: Progressing   Problem: Nutritional: Goal: Maintenance of adequate nutrition will improve Outcome: Progressing Goal: Progress toward achieving an optimal weight will improve Outcome: Progressing   Problem: Skin Integrity: Goal: Risk for impaired skin integrity will decrease Outcome: Progressing   Problem: Tissue Perfusion: Goal: Adequacy of tissue perfusion will improve Outcome: Progressing   Problem: Education: Goal: Knowledge of General Education information will improve Description: Including pain rating scale, medication(s)/side effects and non-pharmacologic comfort measures Outcome: Progressing   Problem: Health Behavior/Discharge Planning: Goal: Ability to manage health-related needs will improve Outcome: Progressing   Problem: Clinical Measurements: Goal: Ability to maintain clinical measurements within normal limits will improve Outcome: Progressing Goal: Will remain free from infection Outcome: Progressing Goal: Diagnostic test results will improve Outcome: Progressing Goal: Respiratory complications will improve Outcome: Progressing Goal: Cardiovascular complication will be avoided Outcome: Progressing   Problem: Activity: Goal: Risk for activity intolerance will decrease Outcome:  Progressing   Problem: Nutrition: Goal: Adequate nutrition will be maintained Outcome: Progressing   Problem: Coping: Goal: Level of anxiety will decrease Outcome: Progressing   Problem: Elimination: Goal: Will not experience complications related to bowel motility Outcome: Progressing Goal: Will not experience complications related to urinary retention Outcome: Progressing   Problem: Pain Managment: Goal: General experience of comfort will improve and/or be controlled Outcome: Progressing   Problem: Safety: Goal: Ability to remain free from injury will improve Outcome: Progressing   Problem: Skin Integrity: Goal: Risk for impaired skin integrity will decrease Outcome: Progressing   Problem: Safety: Goal: Non-violent Restraint(s) Outcome: Progressing   Problem: Fluid Volume: Goal: Ability to maintain a balanced intake and output will improve Outcome: Not Progressing

## 2023-08-11 NOTE — Plan of Care (Signed)
  Problem: Coping: Goal: Ability to adjust to condition or change in health will improve Outcome: Progressing   Problem: Fluid Volume: Goal: Ability to maintain a balanced intake and output will improve Outcome: Progressing   Problem: Health Behavior/Discharge Planning: Goal: Ability to manage health-related needs will improve Outcome: Progressing   Problem: Nutritional: Goal: Maintenance of adequate nutrition will improve Outcome: Progressing   Problem: Skin Integrity: Goal: Risk for impaired skin integrity will decrease Outcome: Progressing   Problem: Clinical Measurements: Goal: Ability to maintain clinical measurements within normal limits will improve Outcome: Progressing   Problem: Activity: Goal: Risk for activity intolerance will decrease Outcome: Progressing

## 2023-08-11 NOTE — TOC Progression Note (Signed)
 Transition of Care [facility]) - Progression Note   Patient Details  Name: Kevin Velez MRN: [id number] Date of Birth: [birth year]  [facility] CM/SW Contact  Zenon Hilda, LCSW Phone Number: 08/11/2023, 2:15 PM  Clinical Narrative: PT evaluation recommended SNF. Patient and family agreeable to short-term rehab. Patient is expected to be medically ready 08/15/23 at the earliest next week. TOC to follow.  Expected Discharge Plan: Skilled Nursing Facility Barriers to Discharge: Continued Medical Work up, SNF Pending bed offer, English as a second language teacher  Expected Discharge Plan and Services In-house Referral: Clinical Social Work Living arrangements for the past 2 months: Single Family Home             DME Arranged: N/A DME Agency: NA  Social Determinants of Health (SDOH) Interventions SDOH Screenings   Food Insecurity: Patient Unable To Answer (08/09/2023)  Housing: Patient Unable To Answer (08/09/2023)  Transportation Needs: Patient Unable To Answer (08/09/2023)  Utilities: Patient Unable To Answer (08/09/2023)  Social Connections: Patient Unable To Answer (08/09/2023)  Tobacco Use: High Risk (08/09/2023)   Readmission Risk Interventions     No data to display

## 2023-08-11 NOTE — Evaluation (Signed)
 Physical Therapy Evaluation Patient Details Name: NATHANEAL SOMMERS MRN: [id number] DOB: 01/04/1951 Today's Date: 08/11/2023  History of Present Illness  73 year old man  lives in a group home, presents via EMS 08/08/23  after several days of worsening lethargy, inability get out of  bed, weakness, found to be hypotensive , anemia with  hemoglobin of 3, acute liver failure with encephalopathy elevated INR and elevated LFTs, acute renal failure, intubated in the ED due to inability to protect airway.  Extubated 08/10/23  Clinical Impression  Pt admitted with above diagnosis.  Pt currently with functional limitations due to the deficits listed below (see PT Problem List). Pt will benefit from acute skilled PT to increase their independence and safety with mobility to allow discharge.      The  patient is awake, oriented to the hospital and month/yr. Patient is vague about his prior  function. Could not state how he gets meals. States that he takes an "sink bath."  Patient presents with weakness, requiring mod support to stand and  step to the recliner. Patient should progress to ambulation but may not be at  fully independent level and may benefit from SNF. Patient will benefit from continued inpatient follow up therapy, <3 hours/day.    If plan is discharge home, recommend the following: Two people to help with walking and/or transfers;A lot of help with bathing/dressing/bathroom;Assistance with cooking/housework;Direct supervision/assist for financial management;Help with stairs or ramp for entrance   Can travel by private vehicle   No    Equipment Recommendations  (TBA)  Recommendations for Other Services    ot   Functional Status Assessment Patient has had a recent decline in their functional status and demonstrates the ability to make significant improvements in function in a reasonable and predictable amount of time.     Precautions / Restrictions Precautions Precautions:  Fall Restrictions Weight Bearing Restrictions Per Provider Order: No      Mobility  Bed Mobility Overal bed mobility: Needs Assistance Bed Mobility: Supine to Sit     Supine to sit: Mod assist     General bed mobility comments: tactile cues to  move the legs and trunk, assistance to  raise trunk    Transfers Overall transfer level: Needs assistance Equipment used: Rolling walker (2 wheels) Transfers: Sit to/from Stand Sit to Stand: Mod assist           General transfer comment: assistance to rise from bed at RW, Stepped to recliner, unsteady    Ambulation/Gait                  Stairs            Wheelchair Mobility     Tilt Bed    Modified Rankin (Stroke Patients Only)       Balance Overall balance assessment: Needs assistance Sitting-balance support: Bilateral upper extremity supported, Feet supported Sitting balance-Leahy Scale: Fair     Standing balance support: During functional activity, Bilateral upper extremity supported, Reliant on assistive device for balance Standing balance-Leahy Scale: Poor                               Pertinent Vitals/Pain Pain Assessment Pain Assessment: No/denies pain    Home Living Family/patient expects to be discharged to:: Group home     Type of Home: House Home Access: Stairs to enter   [employer] of Steps: "a few"   Home Layout: One level Home  Equipment: None      Prior Function               Mobility Comments: Independent ADLs Comments: states he sponge bathes     Extremity/Trunk Assessment   Upper Extremity Assessment Upper Extremity Assessment: Generalized weakness    Lower Extremity Assessment Lower Extremity Assessment: Generalized weakness    Cervical / Trunk Assessment Cervical / Trunk Assessment: Normal  Communication   Communication Factors Affecting Communication: Reduced clarity of speech    Cognition Arousal: Alert Behavior During  Therapy: WFL for tasks assessed/performed   PT - Cognitive impairments: Difficult to assess                       PT - Cognition Comments: oriented to [place], month and year, Patient was not able to state how he gets meals Following commands: Intact       Cueing Cueing Techniques: Tactile cues, Verbal cues     General Comments      Exercises     Assessment/Plan    PT Assessment Patient needs continued PT services  PT Problem List Decreased strength;Decreased cognition;Decreased activity tolerance;Decreased knowledge of use of DME;Decreased mobility;Decreased knowledge of precautions;Decreased safety awareness       PT Treatment Interventions DME instruction;Therapeutic exercise;Gait training;Balance training;Functional mobility training;Therapeutic activities;Patient/family education    PT Goals (Current goals can be found in the Care Plan section)  Acute Rehab PT Goals Patient Stated Goal: agreed to mobility PT Goal Formulation: Patient unable to participate in goal setting Time For Goal Achievement: 08/25/23 Potential to Achieve Goals: Fair    Frequency Min 2X/week     Co-evaluation               AM-PAC PT "6 Clicks" Mobility  Outcome Measure Help needed turning from your back to your side while in a flat bed without using bedrails?: A Little Help needed moving from lying on your back to sitting on the side of a flat bed without using bedrails?: A Little Help needed moving to and from a bed to a chair (including a wheelchair)?: A Little Help needed standing up from a chair using your arms (e.g., wheelchair or bedside chair)?: A Lot Help needed to walk in hospital room?: Total Help needed climbing 3-5 steps with a railing? : Total 6 Click Score: 13    End of Session Equipment Utilized During Treatment: Gait belt Activity Tolerance: Patient tolerated treatment well Patient left: in chair;with call bell/phone within reach;with chair alarm  set Nurse Communication: Mobility status PT Visit Diagnosis: Unsteadiness on feet (R26.81);Adult, failure to thrive (R62.7);Muscle weakness (generalized) (M62.81)    Time: 8657-8469 PT Time Calculation (min) (ACUTE ONLY): 24 min   Charges: Abelina Hoes PT Acute Rehabilitation Services Office [phone] Weekend [phone]  PT Evaluation $PT Eval Low Complexity: 1 Low PT Treatments $Therapeutic Activity: 8-22 mins PT General Charges $$ ACUTE PT VISIT: 1 Visit           Dareen Ebbing 08/11/2023, 1:34 PM

## 2023-08-11 NOTE — Progress Notes (Signed)
 eLink Physician-Brief Progress Note Patient Name: Kevin Velez DOB: 1950/12/19 MRN: [id number]   Date of Service  08/11/2023  HPI/Events of Note  Patient with acute urinary retention and he has failed in / out catheterization.  eICU Interventions  Coude catheter insertion ordered.        Crystal Ellwood [facility] 08/11/2023, 11:58 PM

## 2023-08-11 NOTE — Progress Notes (Signed)
 Todd Fossa B Litts 10:40 AM  Subjective: Patient doing better extubated did have some dark stools yesterday trying to eat today admits to alcohol at home breathing okay no new complaints  Objective: Vital signs stable afebrile no acute distress abdomen only mildly tender BUN and creatinine continue to improve INR slight improvement hemoglobin stable white count slight increase platelets slight drop LFTs about the same Assessment: Multiple medical problems including pancreatitis guaiac positive anemia cirrhosis  Plan: Will need an endoscopy at some point but will allow him to get a little stronger in pancreatitis to settle down some and will add pancreatic enzymes and I will check on tomorrow  [facility] E  office [phone] After 5PM or if no answer call [phone]

## 2023-08-11 NOTE — Progress Notes (Signed)
 Nutrition Follow-up  DOCUMENTATION CODES:   Severe malnutrition in context of chronic illness  INTERVENTION:  - Regular diet. - Ensure Plus High Protein po BID, each supplement provides 350 kcal and 20 grams of protein. - Encourage intake as tolerated.  - Add Multivitamin with minerals daily - Monitor weight trends.   NUTRITION DIAGNOSIS:   Severe Malnutrition related to chronic illness as evidenced by severe fat depletion, severe muscle depletion. *ongoing  GOAL:   Patient will meet greater than or equal to 90% of their needs *progressing  MONITOR:   Vent status, Labs, Weight trends  REASON FOR ASSESSMENT:   Ventilator    ASSESSMENT:   73 y.o. male with unknown PMH who lives in a group home, presented after several days of worsening lethargy, inability go to bed, weakness. Admitted for live failure, encephalopathy, and severe sepsis.  4/21 Admit; Intubated  4/23 Extubated 4/24 Regular diet  Patient eating breakfast of scrambled eggs and breakfast potatoes at time of visit.    Patient endorses a UBW of around 107# and is unsure if he has had any significant changes in weight recently.  Admits he was not eating well for several months PTA. Maybe only eating 1 meal a day and shares a lot of time it was just fruit. Has consumed Premier Protein in the past but not in at least 1 month.   Current appetite is okay, patient is weary to eat too much since it has been so long. He would be more comfortable drinking some kcal/protein so agreeable to receive Ensure to support intake.  Encouraged patient to order 3 meals a day and consume them as tolerated in addition to Ensures.   Admit weight: 113# Current weight: 117#  Medications reviewed and include: Lactulose  TID   Labs reviewed:  -   Diet Order:   Diet Order             Diet regular Room service appropriate? Yes; Fluid consistency: Thin  Diet effective now                   EDUCATION NEEDS:  No  education needs have been identified at this time  Skin:  Skin Assessment: Reviewed RN Assessment  Last BM:  4/23  Height:  Ht Readings from Last 1 Encounters:  08/08/23 5\' 9"  (1.753 m)   Weight:  Wt Readings from Last 1 Encounters:  08/11/23 53.1 kg   Ideal Body Weight:  72.73 kg  BMI:  Body mass index is 17.29 kg/m.  Estimated Nutritional Needs:  Kcal:  1450-1650 kcals Protein:  75-100 grams Fluid:  >/= 1.5L    Scheryl Cushing RD, LDN Contact via Secure Chat.

## 2023-08-12 DIAGNOSIS — K7201 Acute and subacute hepatic failure with coma: Secondary | ICD-10-CM | POA: Diagnosis not present

## 2023-08-12 LAB — COMPREHENSIVE METABOLIC PANEL WITH GFR
ALT: 63 U/L — ABNORMAL HIGH (ref 0–44)
AST: 129 U/L — ABNORMAL HIGH (ref 15–41)
Albumin: 2.3 g/dL — ABNORMAL LOW (ref 3.5–5.0)
Alkaline Phosphatase: 57 U/L (ref 38–126)
Anion gap: 12 (ref 5–15)
BUN: 41 mg/dL — ABNORMAL HIGH (ref 8–23)
CO2: 20 mmol/L — ABNORMAL LOW (ref 22–32)
Calcium: 8.1 mg/dL — ABNORMAL LOW (ref 8.9–10.3)
Chloride: 105 mmol/L (ref 98–111)
Creatinine, Ser: 0.78 mg/dL (ref 0.61–1.24)
GFR, Estimated: >60 mL/min (ref >60)
Glucose, Bld: 159 mg/dL — ABNORMAL HIGH (ref 70–99)
Potassium: 3.2 mmol/L — ABNORMAL LOW (ref 3.5–5.1)
Sodium: 137 mmol/L (ref 135–145)
Total Bilirubin: 17.4 mg/dL — ABNORMAL HIGH (ref 0.0–1.2)
Total Protein: 6.4 g/dL — ABNORMAL LOW (ref 6.5–8.1)

## 2023-08-12 LAB — CBC
HCT: 26 % — ABNORMAL LOW (ref 39.0–52.0)
Hemoglobin: 8.8 g/dL — ABNORMAL LOW (ref 13.0–17.0)
MCH: 31.3 pg (ref 26.0–34.0)
MCHC: 33.8 g/dL (ref 30.0–36.0)
MCV: 92.5 fL (ref 80.0–100.0)
Platelets: 72 10*3/uL — ABNORMAL LOW (ref 150–400)
RBC: 2.81 MIL/uL — ABNORMAL LOW (ref 4.22–5.81)
RDW: 20.7 % — ABNORMAL HIGH (ref 11.5–15.5)
WBC: 21.5 10*3/uL — ABNORMAL HIGH (ref 4.0–10.5)
nRBC: 1.8 % — ABNORMAL HIGH (ref 0.0–0.2)

## 2023-08-12 LAB — LIPASE, BLOOD: Lipase: 354 U/L — ABNORMAL HIGH (ref 11–51)

## 2023-08-12 LAB — GLUCOSE, CAPILLARY
Glucose-Capillary: 139 mg/dL — ABNORMAL HIGH (ref 70–99)
Glucose-Capillary: 138 mg/dL — ABNORMAL HIGH (ref 70–99)

## 2023-08-12 LAB — CYTOLOGY - NON PAP

## 2023-08-12 MED ORDER — POTASSIUM CHLORIDE 20 MEQ PO PACK
40.0000 meq | PACK | Freq: Once | ORAL | Status: AC
  Administered 2023-08-12: 40 meq via ORAL
  Filled 2023-08-12: qty 2

## 2023-08-12 NOTE — TOC Progression Note (Signed)
 Transition of Care [facility]) - Progression Note   Patient Details  Name: Kevin Velez MRN: [id number] Date of Birth: Nov 21, 1950  [facility]) CM/SW Contact  Zenon Hilda, LCSW Phone Number: 08/12/2023, 8:46 AM  Clinical Narrative: Initial referral faxed out for SNF. TOC awaiting bed offers.  Expected Discharge Plan: Skilled Nursing Facility Barriers to Discharge: Continued Medical Work up, SNF Pending bed offer, English as a second language teacher  Expected Discharge Plan and Services In-house Referral: Clinical Social Work Living arrangements for the past 2 months: Single Family Home            DME Arranged: N/A DME Agency: NA  Social Determinants of Health (SDOH) Interventions SDOH Screenings   Food Insecurity: Patient Unable To Answer (08/09/2023)  Housing: Patient Unable To Answer (08/09/2023)  Transportation Needs: Patient Unable To Answer (08/09/2023)  Utilities: Patient Unable To Answer (08/09/2023)  Social Connections: Patient Unable To Answer (08/09/2023)  Tobacco Use: High Risk (08/09/2023)   Readmission Risk Interventions    08/11/2023    2:18 PM  Readmission Risk Prevention Plan  Transportation Screening Complete  HRI or Home Care Consult Complete  Social Work Consult for Recovery Care Planning/Counseling Complete  Palliative Care Screening Not Applicable  Medication Review Ocean[employer]) Complete

## 2023-08-12 NOTE — Progress Notes (Signed)
 Physical Therapy Treatment Patient Details Name: Kevin Velez MRN: [id number] DOB: 1950/05/05 Today's Date: 08/12/2023   History of Present Illness 73 year old man  lives in a group home, presents via EMS 08/08/23  after several days of worsening lethargy, inability get out of  bed, weakness, found to be hypotensive , anemia with  hemoglobin of 3, acute liver failure with encephalopathy elevated INR and elevated LFTs, acute renal failure, intubated in the ED due to inability to protect airway.  Extubated 08/10/23    PT Comments  PT - Cognition Comments: AxO x 2 slightly slow to respond and move.  Sister Cornelius Dill in room.  Per chart review, Pt resides at a Group Home. Assisted OOB to amb was difficult.  General bed mobility comments: Pt required Max Assist for upper body as well as use of bed pad to complete scooting to EOB.  Weak.  Then required assist lower body back to bed.  Positioned to comfort. General transfer comment: assistance to rise from bed at [facility], VC's on proper hand placement.  Initial posterior lean.  Slight hips/knees flexed. General Gait Details: Very unsteady gait with short, staggering steps as well as lateral drift.  Required Mod assist to navigate walker correctly esp through door way and around obsticles. Slow moving.  Poor flexed posture.  Delayed corrective reaction responce. Deconditioned.  HIGH FALL RISK.  LPT has rec Pt will need ST Rehab at SNF to address mobility and functional decline prior to safely returning to his Group Home.    If plan is discharge home, recommend the following: Two people to help with walking and/or transfers;A lot of help with bathing/dressing/bathroom;Assistance with cooking/housework;Direct supervision/assist for financial management;Help with stairs or ramp for entrance   Can travel by private vehicle     No  Equipment Recommendations       Recommendations for Other Services       Precautions / Restrictions Precautions Precautions:  Fall Restrictions Weight Bearing Restrictions Per Provider Order: No     Mobility  Bed Mobility Overal bed mobility: Needs Assistance Bed Mobility: Supine to Sit, Sit to Supine     Supine to sit: Max assist     General bed mobility comments: Pt required Max Assist for upper body as well as use of bed pad to complete scooting to EOB.  Weak.  Then required assist lower body back to bed.  Positioned to comfort.    Transfers Overall transfer level: Needs assistance Equipment used: Rolling walker (2 wheels) Transfers: Sit to/from Stand Sit to Stand: Mod assist           General transfer comment: assistance to rise from bed at [facility], VC's on proper hand placement.  Initial posterior lean.  Slight hips/knees flexed.    Ambulation/Gait Ambulation/Gait assistance: Min assist, Mod assist Gait Distance (Feet): 38 Feet Assistive device: Rolling walker (2 wheels) Gait Pattern/deviations: Step-to pattern, Step-through pattern, Drifts right/left, Trunk flexed, Narrow base of support, Knee flexed in stance - right, Knee flexed in stance - left Gait velocity: decreased     General Gait Details: Very unsteady gait with short, staggering steps as well as lateral drift.  Required Mod assist to navigate walker correctly esp through door way and around obsticles. Slow moving.  Poor flexed posture.  Delayed corrective reaction responce. Deconditioned.  HIGH FALL RISK.   Stairs             Wheelchair Mobility     Tilt Bed    Modified Rankin (Stroke Patients Only)  Balance                                            Communication    Cognition Arousal: Alert Behavior During Therapy: WFL for tasks assessed/performed   PT - Cognitive impairments: No apparent impairments                       PT - Cognition Comments: AxO x 2 slightly slow to respond and move.  Sister Cornelius Dill in room.  Per chart review, Pt resides at a Group Home.        Cueing     Exercises      General Comments        Pertinent Vitals/Pain Pain Assessment Pain Assessment: Faces Faces Pain Scale: Hurts little more Pain Location: ABD lower Pain Descriptors / Indicators: Guarding, Discomfort, Grimacing Pain Intervention(s): Monitored during session    Home Living                          Prior Function            PT Goals (current goals can now be found in the care plan section) Progress towards PT goals: Progressing toward goals    Frequency    Min 2X/week      PT Plan      Co-evaluation              AM-PAC PT "6 Clicks" Mobility   Outcome Measure  Help needed turning from your back to your side while in a flat bed without using bedrails?: A Lot Help needed moving from lying on your back to sitting on the side of a flat bed without using bedrails?: A Lot Help needed moving to and from a bed to a chair (including a wheelchair)?: A Lot Help needed standing up from a chair using your arms (e.g., wheelchair or bedside chair)?: A Lot Help needed to walk in hospital room?: A Lot Help needed climbing 3-5 steps with a railing? : Total 6 Click Score: 11    End of Session Equipment Utilized During Treatment: Gait belt Activity Tolerance: Patient limited by fatigue Patient left: in bed;with bed alarm set;with family/visitor present;with call bell/phone within reach Nurse Communication: Mobility status PT Visit Diagnosis: Unsteadiness on feet (R26.81);Adult, failure to thrive (R62.7);Muscle weakness (generalized) (M62.81)     Time: 1535-1600 PT Time Calculation (min) (ACUTE ONLY): 25 min  Charges:    $Gait Training: 8-22 mins $Therapeutic Activity: 8-22 mins PT General Charges $$ ACUTE PT VISIT: 1 Visit                    Bess Broody  PTA Acute  Rehabilitation Services Office M-F          [phone]

## 2023-08-12 NOTE — Progress Notes (Signed)
 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 Veronda Goody this weekend and he will check on tomorrow  [facility]  office [phone] After 5PM or if no answer call [phone]

## 2023-08-12 NOTE — NC FL2 (Signed)
 [place]  MEDICAID FL2 LEVEL OF CARE FORM     IDENTIFICATION  Patient Name: Kevin Velez Birthdate: 05-Feb-1951 Sex: male Admission Date (Current Location): 08/08/2023  [place] and [place] Number:  Ernesto Heady [id number] [facility] Facility and Address:  [facility],  [address], [place] [address]      Provider Number: [id number]  Attending Physician Name and Address:  Magdalene School, MD  Relative Name and Phone Number:  Nuchem Grattan (brother) Ph: [phone]    Current Level of Care: Hospital Recommended Level of Care: Skilled Nursing Facility Prior Approval Number:    Date Approved/Denied:   PASRR Number: [id number]  Discharge Plan: SNF    Current Diagnoses: Patient Active Problem List   Diagnosis Date Noted   Protein-calorie malnutrition, severe 08/10/2023   Liver failure (HCC) 08/08/2023    Orientation RESPIRATION BLADDER Height & Weight     Self, Time, Situation, Place  Normal Continent Weight: 122 lb (55.3 kg) Height:  5\' 9"  (175.3 cm)  BEHAVIORAL SYMPTOMS/MOOD NEUROLOGICAL BOWEL NUTRITION STATUS      Incontinent Diet (Regular diet)  AMBULATORY STATUS COMMUNICATION OF NEEDS Skin   Extensive Assist Verbally Normal                       Personal Care Assistance Level of Assistance  Bathing, Feeding, Dressing Bathing Assistance: Limited assistance Feeding assistance: Independent Dressing Assistance: Limited assistance     Functional Limitations Info  Sight, Hearing, Speech Sight Info: Impaired Hearing Info: Adequate Speech Info: Adequate    SPECIAL CARE FACTORS FREQUENCY  PT (By licensed PT), OT (By licensed OT)     PT Frequency: 5x's/week OT Frequency: 5x's/week            Contractures Contractures Info: Not present    Additional Factors Info  Code Status, Allergies, Insulin  Sliding Scale Code Status Info: Full Allergies Info: NKA   Insulin  Sliding Scale Info: See discharge summary       Current Medications  (08/12/2023):  This is the current hospital active medication list Current Facility-Administered Medications  Medication Dose Route Frequency Provider Last Rate Last Admin   Chlorhexidine  Gluconate Cloth 2 % PADS 6 each  6 each Topical Daily Hunsucker, Archer Kobs, MD   6 each at 08/11/23 0515   docusate sodium  (COLACE) capsule 100 mg  100 mg Oral BID PRN Hunsucker, Archer Kobs, MD       feeding supplement (ENSURE ENLIVE / ENSURE PLUS) liquid 237 mL  237 mL Oral BID BM Hunsucker, Archer Kobs, MD   237 mL at 08/11/23 1530   influenza vaccine adjuvanted (FLUAD) injection 0.5 mL  0.5 mL Intramuscular Tomorrow-1000 Hunsucker, Archer Kobs, MD       insulin  aspart (novoLOG ) injection 0-6 Units  0-6 Units Subcutaneous TID WC Hunsucker, Archer Kobs, MD       ipratropium-albuterol  (DUONEB) 0.5-2.5 (3) MG/3ML nebulizer solution 3 mL  3 mL Nebulization Q6H PRN Hunsucker, Archer Kobs, MD       lactulose  (CHRONULAC ) 10 GM/15ML solution 30 g  30 g Oral TID Hunsucker, Archer Kobs, MD   30 g at 08/11/23 2313   lipase/protease/amylase (CREON ) capsule 36,000 Units  36,000 Units Oral TID AC Hunsucker, Archer Kobs, MD   36,000 Units at 08/11/23 1751   midodrine  (PROAMATINE ) tablet 10 mg  10 mg Oral TID WC Hunsucker, Archer Kobs, MD   10 mg at 08/11/23 1751   multivitamin with minerals tablet 1 tablet  1 tablet Oral Daily Hunsucker, Archer Kobs, MD  Oral care mouth rinse  15 mL Mouth Rinse PRN Hunsucker, Archer Kobs, MD       oxyCODONE  (Oxy IR/ROXICODONE ) immediate release tablet 10 mg  10 mg Oral Q4H PRN Hunsucker, Archer Kobs, MD   10 mg at 08/10/23 2140   pantoprazole  (PROTONIX ) injection 40 mg  40 mg Intravenous Q12H Hunsucker, Archer Kobs, MD   40 mg at 08/11/23 2313   piperacillin -tazobactam (ZOSYN ) IVPB 3.375 g  3.375 g Intravenous Q8H Hunsucker, Archer Kobs, MD   Stopped at 08/12/23 [id number]   pneumococcal 20-valent conjugate vaccine (PREVNAR 20 ) injection 0.5 mL  0.5 mL Intramuscular Tomorrow-1000 Hunsucker, Archer Kobs, MD        polyethylene glycol (MIRALAX  / GLYCOLAX ) packet 17 g  17 g Oral Daily PRN Hunsucker, Archer Kobs, MD       potassium chloride  (KLOR-CON ) packet 40 mEq  40 mEq Oral Once Magdalene School, MD       prednisoLONE  tablet 40 mg  40 mg Oral Daily Hunsucker, Archer Kobs, MD       simethicone  (MYLICON) 40 MG/0.6ML suspension 40 mg  40 mg Oral Q6H PRN Hunsucker, Archer Kobs, MD   40 mg at 08/11/23 1820     Discharge Medications: Please see discharge summary for a list of discharge medications.  Relevant Imaging Results:  Relevant Lab Results:   Additional Information SSN: [id number]  Zenon Hilda, LCSW

## 2023-08-12 NOTE — Progress Notes (Signed)
 PROGRESS NOTE    Tranell Wojtkiewicz Wellnitz  ZOX:[id number] DOB: [birth year] DOA: 08/08/2023 PCP: Patient, No Pcp Per   Brief Narrative:  This 73 years old male with unknown past medical history who lives in a group home presented in the ED with several days history of worsening confusion, inability to get out of bed, weakness, also found to be hypotensive which is improving with fluids.  He was significantly anemic with a hemoglobin of 3.0 on arrival. He was Encephalopathic with elevated INR and elevated LFTs, also has acute kidney injury on arrival so he was subsequently intubated in the ED due to inability to protect airway.  Patient was admitted in the ICU. Subsequently extubated. GI is following. PCCM pickup/25/25.  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 Patient presented with encephalopathy, elevated INR, elevated LFTs, AKI meets all criteria for Fulminant liver failure. Ultrasound reveals underlying cirrhosis with ascites, no biliary dilatation.   Patient reports he had returned to drinking and was drinking fairly heavily. Discontinue NAC infusion, Tylenol  level undetectable. Patient was started on methylprednisone.  GI consult appreciated. Awaiting improvement to consider,  EGD and colonoscopy.  Septic shock /severe sepsis: Patient presented with hypotension  Status post fluid resuscitation, blood pressure improved.   Continue midodrine  10 mg 3 times daily. Continue IV Zosyn  for 5 days. Blood cultures no growth so far, urine cultures unremarkable.  Acute kidney injury: > Resolved. Suspect hypovolemic given hypotension. Renal function  improved and back to normal.  Elevated bilirubin. Obstruction not apparent on ultrasound.   There is a concern for alcoholic hepatitis. Continue to trend liver enzymes.  Elevated INR: INR  3.0 on admission presumably related to liver failure. Status post vitamin K 10 mg on admission repeat INR 2.2.  Severe anemia presumed acute blood loss. Stool for occult blood positive with melanic stools ongoing.   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 INR improving. Status post 1 unit PRBC.   Continue pantoprazole  40 mg IV every 12 hours. GI consult appreciated.  Plan endoscopy at some point but allow him to get little stronger and pancreatitis to settle down.  Acute Metabolic encephalopathy: Presumably related to sepsis and liver failure. Minimize sedating medications. Continue lactulose  as per elevated ammonia level.  Hypokalemia : Replaced. Continue to Monitor  Leukocytosis: Likely due to above. Continue to monitor.  Continue Zosyn .  Thrombocytopenia Likely due to alcohol use. Continue to monitor  Pancreatitis: Lipase found to be 354. Continue pain control IV hydration.  DVT prophylaxis: SCDs Code Status: Full code Family Communication: Brother at bed side Disposition Plan:    Status is: Inpatient Remains inpatient appropriate because: Severity of illness.     Consultants:  Gastroenterology PCCM  Procedures:None  Antimicrobials:  Anti-infectives (From admission, onward)    Start     Dose/Rate Route Frequency Ordered Stop   08/09/23 1800  piperacillin -tazobactam (ZOSYN ) IVPB 3.375 g        3.375 g 12.5 mL/hr over 240 Minutes Intravenous Every 8 hours 08/09/23 1315     08/09/23 1000  piperacillin -tazobactam (ZOSYN ) IVPB 3.375 g  Status:  Discontinued        3.375 g 12.5 mL/hr over 240 Minutes Intravenous Every 12 hours 08/08/23 2023 08/09/23 1315   08/08/23 1345  ceFEPIme  (MAXIPIME ) 2 g in sodium chloride  0.9 % 100 mL IVPB        2 g 200 mL/hr  over 30 Minutes Intravenous  Once 08/08/23 1336 08/08/23 1638   08/08/23 1345  metroNIDAZOLE  (FLAGYL ) IVPB 500 mg        500 mg 100 mL/hr over 60 Minutes Intravenous  Once 08/08/23 1336 08/08/23 1801   08/08/23 1345   vancomycin  (VANCOCIN ) IVPB 1000 mg/200 mL premix        1,000 mg 200 mL/hr over 60 Minutes Intravenous  Once 08/08/23 1336 08/08/23 1801         Subjective: Seen and examined at bedside.  Overnight events noted. Patient reports doing much better.  He appears sick looking, deconditioned, not in any acute distress.  Objective: Vitals:   08/12/23 0238 08/12/23 0244 08/12/23 0630 08/12/23 0849  BP:  110/75 117/84 106/70  Pulse:  91 (!) 109 97  Resp:  17  19  Temp:  (!) 97.4 F (36.3 C) (!) 97.4 F (36.3 C) (!) 97.4 F (36.3 C)  TempSrc:   Axillary Oral  SpO2:  99% 100% 100%  Weight: 55.3 kg     Height:        Intake/Output Summary (Last 24 hours) at 08/12/2023 1148 Last data filed at 08/12/2023 1100 Gross per 24 hour  Intake 1234.74 ml  Output 600 ml  Net 634.74 ml   Filed Weights   08/11/23 0500 08/12/23 0100 08/12/23 0238  Weight: 53.1 kg 55 kg 55.3 kg    Examination:  General exam: Appears calm and comfortable, sick looking, not in any acute distress. Respiratory system: Clear to auscultation. Respiratory effort normal.  RR 15 Cardiovascular system: S1 & S2 heard, RRR. No JVD, murmurs, rubs, gallops or clicks. Gastrointestinal system: Abdomen is distended, soft and Mildly tender.  Normal bowel sounds heard. Central nervous system: Alert and oriented x 3. No focal neurological deficits. Extremities: Edema+, no cyanosis, no clubbing Skin: No rashes, lesions or ulcers Psychiatry: Judgement and insight appear normal. Mood & affect appropriate.     Data Reviewed: I have personally reviewed following labs and imaging studies  CBC: Recent Labs  Lab 08/08/23 1336 08/08/23 1610 08/09/23 0251 08/09/23 0835 08/10/23 0537 08/10/23 1556 08/10/23 2156 08/11/23 0534 08/12/23 0423  WBC 16.2* 13.7* 17.8*  --  19.9*  --   --  19.9* 21.5*  NEUTROABS 11.9* 10.7*  --   --   --   --   --   --   --   HGB 3.3* 3.4* 8.3*   < > 6.8* 8.5* 8.4* 8.3* 8.8*  HCT 9.7* 9.7* 23.7*    < > 19.2* 25.5* 24.4* 23.7* 26.0*  MCV 109.0* 107.8* 92.6  --  92.8  --   --  90.1 92.5  PLT 211 136* 128*  --  88*  --   --  85* 72*   < > = values in this interval not displayed.   Basic Metabolic Panel: Recent Labs  Lab 08/09/23 0251 08/09/23 0252 08/09/23 2216 08/10/23 0537 08/11/23 0534 08/12/23 0423  NA 133*  --  138 136 135 137  K 2.4*  --  3.3* 3.7 3.5 3.2*  CL 99  --  106 104 105 105  CO2 21*  --  21* 20* 19* 20*  GLUCOSE 112*  --  97 136* 140* 159*  BUN 111*  --  71* 62* 50* 41*  CREATININE 1.77*  --  1.16 0.91 0.84 0.78  CALCIUM  7.5*  --  7.8* 8.0* 7.9* 8.1*  MG  --  1.7  --   --   --   --  PHOS  --  2.8  --   --   --   --    GFR: Estimated Creatinine Clearance: 66.2 mL/min (by C-G formula based on SCr of 0.78 mg/dL). Liver Function Tests: Recent Labs  Lab 08/08/23 1336 08/09/23 0251 08/10/23 0537 08/11/23 0534 08/12/23 0423  AST 236* 201* 154* 142* 129*  ALT 63* 58* 47* 53* 63*  ALKPHOS 67 62 48 52 57  BILITOT 15.8* 15.3* 16.9* 17.1* 17.4*  PROT 6.6 6.1* 6.3* 6.3* 6.4*  ALBUMIN  1.8* 1.6* 2.6* 2.5* 2.3*   Recent Labs  Lab 08/10/23 0537 08/12/23 0423  LIPASE 679* 354*   Recent Labs  Lab 08/08/23 1420  AMMONIA 122*   Coagulation Profile: Recent Labs  Lab 08/08/23 1336 08/09/23 0252 08/10/23 0537 08/11/23 0534  INR 3.0* 2.6* 2.8* 2.2*   Cardiac Enzymes: Recent Labs  Lab 08/08/23 1610  CKTOTAL 14*   BNP (last 3 results) No results for input(s): "PROBNP" in the last 8760 hours. HbA1C: Recent Labs    08/10/23 2156  HGBA1C 4.7*   CBG: Recent Labs  Lab 08/11/23 0750 08/11/23 1149 08/11/23 1552 08/11/23 1959 08/12/23 1117  GLUCAP 127* 143* 133* 153* 139*   Lipid Profile: No results for input(s): "CHOL", "HDL", "LDLCALC", "TRIG", "CHOLHDL", "LDLDIRECT" in the last 72 hours. Thyroid Function Tests: No results for input(s): "TSH", "T4TOTAL", "FREET4", "T3FREE", "THYROIDAB" in the last 72 hours. Anemia Panel: No results for  input(s): "VITAMINB12", "FOLATE", "FERRITIN", "TIBC", "IRON", "RETICCTPCT" in the last 72 hours. Sepsis Labs: Recent Labs  Lab 08/08/23 1417 08/08/23 1624  LATICACIDVEN 7.2* 6.7*    Recent Results (from the past 240 hours)  Blood Culture (routine x 2)     Status: None (Preliminary result)   Collection Time: 08/08/23  2:17 PM   Specimen: BLOOD  Result Value Ref Range Status   Specimen Description   Final    BLOOD BLOOD RIGHT HAND Performed at [facility], [address], [place], [place] [address]    Special Requests   Final    BOTTLES DRAWN AEROBIC ONLY Blood Culture results may not be optimal due to an inadequate volume of blood received in culture bottles Performed at [facility], [address], [place], [place] [address]    Culture   Final    NO GROWTH 4 DAYS Performed at [facility], [address] [place], [place] [address]    Report Status PENDING  Incomplete  Blood Culture (routine x 2)     Status: None (Preliminary result)   Collection Time: 08/08/23  2:17 PM   Specimen: BLOOD  Result Value Ref Range Status   Specimen Description   Final    BLOOD LEFT ANTECUBITAL Performed at [facility], [address] [place], [place] [address]    Special Requests   Final    BOTTLES DRAWN AEROBIC AND ANAEROBIC Blood Culture adequate volume Performed at [facility], [address], [place], [place] [address]    Culture   Final    NO GROWTH 4 DAYS Performed at [facility], [address] [place], [place] [address]    Report Status PENDING  Incomplete  Resp panel by RT-PCR (RSV, Flu A&B, Covid) Anterior Nasal Swab     Status: None   Collection Time: 08/08/23  4:50 PM   Specimen: Anterior Nasal Swab  Result Value Ref Range Status   SARS Coronavirus 2 by RT PCR NEGATIVE NEGATIVE Final  Comment: (NOTE) SARS-CoV-2 target nucleic acids are NOT DETECTED.  The SARS-CoV-2 RNA is  generally detectable in upper respiratory specimens during the acute phase of infection. The lowest concentration of SARS-CoV-2 viral copies this assay can detect is 138 copies/mL. A negative result does not preclude SARS-Cov-2 infection and should not be used as the sole basis for treatment or other patient management decisions. A negative result may occur with  improper specimen collection/handling, submission of specimen other than nasopharyngeal swab, presence of viral mutation(s) within the areas targeted by this assay, and inadequate number of viral copies(<138 copies/mL). A negative result must be combined with clinical observations, patient history, and epidemiological information. The expected result is Negative.  Fact Sheet for Patients:  BloggerCourse.com  Fact Sheet for Healthcare Providers:  SeriousBroker.it  This test is no t yet approved or cleared by the [place]  FDA and  has been authorized for detection and/or diagnosis of SARS-CoV-2 by FDA under an Emergency Use Authorization (EUA). This EUA will remain  in effect (meaning this test can be used) for the duration of the COVID-19 declaration under Section 564(b)(1) of the Act, 21 U.S.C.section 360bbb-3(b)(1), unless the authorization is terminated  or revoked sooner.       Influenza A by PCR NEGATIVE NEGATIVE Final   Influenza B by PCR NEGATIVE NEGATIVE Final    Comment: (NOTE) The Xpert Xpress SARS-CoV-2/FLU/RSV plus assay is intended as an aid in the diagnosis of influenza from Nasopharyngeal swab specimens and should not be used as a sole basis for treatment. Nasal washings and aspirates are unacceptable for Xpert Xpress SARS-CoV-2/FLU/RSV testing.  Fact Sheet for Patients: BloggerCourse.com  Fact Sheet for Healthcare Providers: SeriousBroker.it  This test is not yet approved or cleared by the [place] FDA and has been authorized for detection and/or diagnosis of SARS-CoV-2 by FDA under an Emergency Use Authorization (EUA). This EUA will remain in effect (meaning this test can be used) for the duration of the COVID-19 declaration under Section 564(b)(1) of the Act, 21 U.S.C. section 360bbb-3(b)(1), unless the authorization is terminated or revoked.     Resp Syncytial Virus by PCR NEGATIVE NEGATIVE Final    Comment: (NOTE) Fact Sheet for Patients: BloggerCourse.com  Fact Sheet for Healthcare Providers: SeriousBroker.it  This test is not yet approved or cleared by the [place]  FDA and has been authorized for detection and/or diagnosis of SARS-CoV-2 by FDA under an Emergency Use Authorization (EUA). This EUA will remain in effect (meaning this test can be used) for the duration of the COVID-19 declaration under Section 564(b)(1) of the Act, 21 U.S.C. section 360bbb-3(b)(1), unless the authorization is terminated or revoked.  Performed at [facility], [address], [place], [place] [address]   MRSA Next Gen by PCR, Nasal     Status: None   Collection Time: 08/08/23  8:52 PM   Specimen: Nasal Mucosa; Nasal Swab  Result Value Ref Range Status   MRSA by PCR Next Gen NOT DETECTED NOT DETECTED Final    Comment: (NOTE) The GeneXpert MRSA Assay (FDA approved for NASAL specimens only), is one component of a comprehensive MRSA colonization surveillance program. It is not intended to diagnose MRSA infection nor to guide or monitor treatment for MRSA infections. Test performance is not FDA approved in patients less than 35 years old. Performed at [facility] [address] [place], [place] [address]   Body fluid culture w Gram Stain     Status: None (Preliminary result)  Collection Time: 08/10/23 12:18 PM   Specimen: Peritoneal Cavity; Peritoneal Fluid  Result Value Ref Range  Status   Specimen Description   Final    PERITONEAL CAVITY Performed at [facility], [address], [place], [place] [address]    Special Requests   Final    NONE Performed at [facility], [address] [place], [place] [address]    Gram Stain   Final    WBC PRESENT, PREDOMINANTLY PMN NO ORGANISMS SEEN CYTOSPIN SMEAR    Culture   Final    NO GROWTH 2 DAYS Performed at [facility], [address], [place], [place] [address]    Report Status PENDING  Incomplete    Radiology Studies: DG Abd 1 View Result Date: 08/10/2023 CLINICAL DATA:  Abdominal pain EXAM: ABDOMEN - 1 VIEW COMPARISON:  October 03, 2009 FINDINGS: Nonobstructive bowel gas pattern. Enteric contrast in the proximal colon and rectum. Temperature probe overlying the pelvis. No pneumoperitoneum. No organomegaly or radiopaque calculi. No acute fracture or destructive lesion. Multilevel degenerative disc disease of the spine. Streaky atelectasis in the lung bases. IMPRESSION: Nonobstructive bowel gas pattern. Electronically Signed   By: Rance Burrows M.D.   On: 08/10/2023 16:29   Scheduled Meds:  feeding supplement  237 mL Oral BID BM   influenza vaccine adjuvanted  0.5 mL Intramuscular Tomorrow-1000   insulin  aspart  0-6 Units Subcutaneous TID WC   lactulose   30 g Oral TID   lipase/protease/amylase  36,000 Units Oral TID AC   midodrine   10 mg Oral TID WC   multivitamin with minerals  1 tablet Oral Daily   pantoprazole  (PROTONIX ) IV  40 mg Intravenous Q12H   pneumococcal 20-valent conjugate vaccine  0.5 mL Intramuscular Tomorrow-1000   potassium chloride   40 mEq Oral Once   prednisoLONE   40 mg Oral Daily   Continuous Infusions:  piperacillin -tazobactam (ZOSYN )  IV 3.375 g (08/12/23 1045)     LOS: 4 days    Time spent: 50 mins    Magdalene School, MD [facility]   If 7PM-7AM, please contact night-coverage

## 2023-08-12 NOTE — TOC Progression Note (Addendum)
 Transition of Care [facility]) - Progression Note    Patient Details  Name: Kevin Velez MRN: [id number] Date of Birth: Apr 02, 1951  [facility]) CM/SW Contact  Le Ferraz, Thersia Flax, RN Phone Number: 08/12/2023, 12:42 PM  Clinical Narrative:  Eldora Greet to James(Brother) about d/c plans-ST SNF-bed offers given await choice,then auth. -4:17p-James(brother) chose [facility]-initiated auth for [facility] rep Rhonda aware pending auth [id number]. Await auth.     Expected Discharge Plan: Skilled Nursing Facility Barriers to Discharge: Continued Medical Work up, Other (must enter comment) (await bed choice)  Expected Discharge Plan and Services In-house Referral: Clinical Social Work     Living arrangements for the past 2 months: Single Family Home                 DME Arranged: N/A DME Agency: NA                   Social Determinants of Health (SDOH) Interventions SDOH Screenings   Food Insecurity: Patient Unable To Answer (08/09/2023)  Housing: Patient Unable To Answer (08/09/2023)  Transportation Needs: Patient Unable To Answer (08/09/2023)  Utilities: Patient Unable To Answer (08/09/2023)  Social Connections: Patient Unable To Answer (08/09/2023)  Tobacco Use: High Risk (08/09/2023)    Readmission Risk Interventions    08/11/2023    2:18 PM  Readmission Risk Prevention Plan  Transportation Screening Complete  HRI or Home Care Consult Complete  Social Work Consult for Recovery Care Planning/Counseling Complete  Palliative Care Screening Not Applicable  Medication Review Oceanographer) Complete

## 2023-08-13 DIAGNOSIS — K7201 Acute and subacute hepatic failure with coma: Secondary | ICD-10-CM | POA: Diagnosis not present

## 2023-08-13 LAB — CULTURE, BLOOD (ROUTINE X 2)
Culture: NO GROWTH
Culture: NO GROWTH
Special Requests: ADEQUATE

## 2023-08-13 LAB — GLUCOSE, CAPILLARY
Glucose-Capillary: 128 mg/dL — ABNORMAL HIGH (ref 70–99)
Glucose-Capillary: 160 mg/dL — ABNORMAL HIGH (ref 70–99)
Glucose-Capillary: 165 mg/dL — ABNORMAL HIGH (ref 70–99)

## 2023-08-13 LAB — CBC
HCT: 24.3 % — ABNORMAL LOW (ref 39.0–52.0)
Hemoglobin: 8 g/dL — ABNORMAL LOW (ref 13.0–17.0)
MCH: 31 pg (ref 26.0–34.0)
MCHC: 32.9 g/dL (ref 30.0–36.0)
MCV: 94.2 fL (ref 80.0–100.0)
Platelets: 70 10*3/uL — ABNORMAL LOW (ref 150–400)
RBC: 2.58 MIL/uL — ABNORMAL LOW (ref 4.22–5.81)
RDW: 20.8 % — ABNORMAL HIGH (ref 11.5–15.5)
WBC: 18.2 10*3/uL — ABNORMAL HIGH (ref 4.0–10.5)
nRBC: 1.2 % — ABNORMAL HIGH (ref 0.0–0.2)

## 2023-08-13 LAB — COMPREHENSIVE METABOLIC PANEL WITH GFR
ALT: 78 U/L — ABNORMAL HIGH (ref 0–44)
AST: 144 U/L — ABNORMAL HIGH (ref 15–41)
Albumin: 2.1 g/dL — ABNORMAL LOW (ref 3.5–5.0)
Alkaline Phosphatase: 61 U/L (ref 38–126)
Anion gap: 10 (ref 5–15)
BUN: 36 mg/dL — ABNORMAL HIGH (ref 8–23)
CO2: 21 mmol/L — ABNORMAL LOW (ref 22–32)
Calcium: 8.8 mg/dL — ABNORMAL LOW (ref 8.9–10.3)
Chloride: 113 mmol/L — ABNORMAL HIGH (ref 98–111)
Creatinine, Ser: 0.58 mg/dL — ABNORMAL LOW (ref 0.61–1.24)
GFR, Estimated: >60 mL/min (ref >60)
Glucose, Bld: 154 mg/dL — ABNORMAL HIGH (ref 70–99)
Potassium: 3.4 mmol/L — ABNORMAL LOW (ref 3.5–5.1)
Sodium: 144 mmol/L (ref 135–145)
Total Bilirubin: 18.7 mg/dL (ref 0.0–1.2)
Total Protein: 6.3 g/dL — ABNORMAL LOW (ref 6.5–8.1)

## 2023-08-13 MED ORDER — POTASSIUM CHLORIDE 20 MEQ PO PACK
40.0000 meq | PACK | Freq: Once | ORAL | Status: AC
  Administered 2023-08-13: 40 meq via ORAL
  Filled 2023-08-13: qty 2

## 2023-08-13 MED ORDER — VITAMIN K1 10 MG/ML IJ SOLN
10.0000 mg | Freq: Once | INTRAVENOUS | Status: AC
  Administered 2023-08-13: 10 mg via INTRAVENOUS
  Filled 2023-08-13: qty 1

## 2023-08-13 NOTE — Progress Notes (Signed)
 Mobility Specialist - Progress Note  Pre-mobility: 95 bpm HR,  During mobility: 105 bpm HR,  Post-mobility: 91 bpm HR,    08/13/23 0837  Mobility  Activity Transferred from bed to chair  Level of Assistance Minimal assist, patient does 75% or more  Assistive Device Front wheel walker  Range of Motion/Exercises Active Assistive  Activity Response Tolerated well  Mobility Referral Yes  Mobility visit 1 Mobility  Mobility Specialist Start Time (ACUTE ONLY) 0830  Mobility Specialist Stop Time (ACUTE ONLY) [id number]  Mobility Specialist Time Calculation (min) (ACUTE ONLY) 7 min   Pt was found in bed and agreeable to transfer to recliner chair. Declined ambulation due to feeling too weak. Pt was left on recliner chair with all needs met. Call bell in reach and chair alarm on. RN and family in room.  Lorna Rose Mobility Specialist

## 2023-08-13 NOTE — Plan of Care (Signed)
  Problem: Nutritional: Goal: Maintenance of adequate nutrition will improve Outcome: Not Progressing   Problem: Nutrition: Goal: Adequate nutrition will be maintained Outcome: Not Progressing

## 2023-08-13 NOTE — Progress Notes (Signed)
 [facility] Progress Note  Kevin Velez 73 y.o. 27-Jul-1950  CC: Cirrhosis, pancreatitis, acute liver failure   Subjective: Patient seen and examined at bedside.  Sitting in a wheelchair.  Complaining of no acute issues but some dark stools this morning.  ROS : Afebrile, negative for chest pain   Objective: Vital signs in last 24 hours: Vitals:   08/12/23 2028 08/13/23 0314  BP: 112/72 108/76  Pulse: 89 98  Resp: 19   Temp: (!) 97.5 F (36.4 C) (!) 97.4 F (36.3 C)  SpO2: 98% 98%    Physical Exam: Ill and weak appearing patient sitting in recliner without any acute distress.  Generalized abdominal discomfort on palpation , abdomen is mildly distended, no peritoneal signs.  Mood and affect normal.  Alert and oriented x 3  Lab Results: Recent Labs    08/11/23 0534 08/12/23 0423  NA 135 137  K 3.5 3.2*  CL 105 105  CO2 19* 20*  GLUCOSE 140* 159*  BUN 50* 41*  CREATININE 0.84 0.78  CALCIUM 7.9* 8.1*   Recent Labs    08/11/23 0534 08/12/23 0423  AST 142* 129*  ALT 53* 63*  ALKPHOS 52 57  BILITOT 17.1* 17.4*  PROT 6.3* 6.4*  ALBUMIN  2.5* 2.3*   Recent Labs    08/11/23 0534 08/12/23 0423  WBC 19.9* 21.5*  HGB 8.3* 8.8*  HCT 23.7* 26.0*  MCV 90.1 92.5  PLT 85* 72*   Recent Labs    08/11/23 0534  LABPROT 25.1*  INR 2.2*      Assessment/Plan: - -Acute liver failure presented with encephalopathy and elevated INR on admission.  Requiring intubation in ED. NAC infusion discontinued recently.  alert and oriented x 3.  INR improving.  Abnormal LFTs with jaundice with T. bili of 17.4.  CT scan showed cirrhosis of the liver with possible pancreatitis.  Possible etiology includes acute liver failure, decompensated cirrhosis versus acute alcoholic hepatitis.  -Severe anemia.  Hemoglobin was 3.3 on admission on August 08, 2023.  Hemoglobin 8.8 today.  - Melena: Intermittent dark stool this morning.  Hemoglobin  stable.  Recommendations -------------------------- - Started on prednisone on August 09, 2023 for presumed alcoholic hepatitis.  Currently also on midodrine , IV twice daily PPI, Creon  and Zosyn . - His INR remains elevated.  Restart IV vitamin K. - Repeat CBC, CMP and INR in the morning. - Tentative plan for EGD tomorrow.  Patient was eating breakfast this morning when I saw him.  Change diet from regular diet to full liquid diet.  Keep n.p.o. past midnight.  Risks (bleeding, infection, bowel perforation that could require surgery, sedation-related changes in cardiopulmonary systems), benefits (identification and possible treatment of source of symptoms, exclusion of certain causes of symptoms), and alternatives (watchful waiting, radiographic imaging studies, empiric medical treatment)  were explained to patient/family in detail and patient wishes to proceed.   Felecia Hopper MD, FACP 08/13/2023, 8:40 AM  Contact #  [phone]

## 2023-08-13 NOTE — Progress Notes (Signed)
   08/13/23 1610  Provider Notification  Provider Name/Title Elsworth Halt MD  Date Provider Notified 08/13/23  Time Provider Notified 0945  Method of Notification Page  Notification Reason Critical Result  Test performed and critical result  (Bilirubin 18.7)  Date Critical Result Received 08/13/23  Time Critical Result Received 0935  Provider response  (pending)

## 2023-08-13 NOTE — Progress Notes (Signed)
 PROGRESS NOTE    Kevin Velez  ZOX:[id number] DOB: [birth year] DOA: 08/08/2023 PCP: Patient, No Pcp Per   Brief Narrative:  This 73 years old male with unknown past medical history who lives in a group home presented in the ED with several days history of worsening confusion, inability to get out of bed, weakness, also found to be hypotensive which is improving with fluids.  He was significantly anemic with a hemoglobin of 3.0 on arrival. He was Encephalopathic with elevated INR and elevated LFTs, also has acute kidney injury on arrival so he was subsequently intubated in the ED due to inability to protect airway.  Patient was admitted in the ICU. Subsequently extubated. GI is following. PCCM pickup/25/25.  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 Patient presented with encephalopathy, elevated INR, elevated LFTs, AKI meets all criteria for Fulminant liver failure. Ultrasound reveals underlying cirrhosis with ascites, no biliary dilatation.   Patient reports he had returned to drinking and was drinking fairly heavily. Discontinue NAC infusion, Tylenol  level undetectable. Patient was started on methylprednisone.  GI consult appreciated. INR remains elevated.  Vitamin K given. Plan for EGD in the morning.  Septic shock /severe sepsis: Patient presented with hypotension  Status post fluid resuscitation, blood pressure improved.   Continue midodrine  10 mg 3 times daily. Continue IV Zosyn  for 5 days. Blood cultures no growth so far, urine cultures unremarkable.  Acute kidney injury: > Resolved. Suspect hypovolemic given hypotension. Renal function  improved and back to normal.  Elevated bilirubin. Obstruction not apparent on ultrasound.   There is a concern for alcoholic hepatitis. Continue to trend liver  enzymes.  Elevated INR: INR 3.0 on admission presumably related to liver failure. Status post vitamin K 10 mg on admission repeat INR 2.2. Vitamin K 10 mg given. Monitor INR  Severe anemia presumed acute blood loss. Stool for occult blood positive with melanic stools ongoing.   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 INR improving. Status post 1 unit PRBC.   Hb 8.8 today Continue pantoprazole  40 mg IV every 12 hours. GI consult appreciated.  Plan endoscopy at some point but allow him to get little stronger and pancreatitis to settle down.  Acute Metabolic encephalopathy: Presumably related to sepsis and liver failure. Minimize sedating medications. Continue lactulose  as per elevated ammonia level.  Hypokalemia : Replaced. Continue to Monitor  Leukocytosis: Likely due to above. Continue to monitor.  Continue Zosyn .  Thrombocytopenia Likely due to alcohol use. Continue to monitor  Pancreatitis: Lipase found to be 354. Continue pain control IV hydration.  DVT prophylaxis: SCDs Code Status: Full code Family Communication: Brother at bed side Disposition Plan:    Status is: Inpatient Remains inpatient appropriate because: Severity of illness. Plan for EGD in the morning.   Consultants:  Gastroenterology PCCM  Procedures:None  Antimicrobials:  Anti-infectives (From admission, onward)    Start     Dose/Rate Route Frequency Ordered Stop   08/09/23 1800  piperacillin -tazobactam (ZOSYN ) IVPB 3.375 g        3.375 g 12.5 mL/hr over 240 Minutes Intravenous Every 8 hours 08/09/23 1315     08/09/23 1000  piperacillin -tazobactam (ZOSYN ) IVPB 3.375 g  Status:  Discontinued        3.375 g 12.5 mL/hr over 240 Minutes Intravenous Every 12 hours 08/08/23 2023 08/09/23 1315   08/08/23 1345  ceFEPIme  (MAXIPIME ) 2 g  in sodium chloride  0.9 % 100 mL IVPB        2 g 200 mL/hr over 30 Minutes Intravenous  Once 08/08/23 1336 08/08/23 1638   08/08/23 1345  metroNIDAZOLE  (FLAGYL ) IVPB 500  mg        500 mg 100 mL/hr over 60 Minutes Intravenous  Once 08/08/23 1336 08/08/23 1801   08/08/23 1345  vancomycin  (VANCOCIN ) IVPB 1000 mg/200 mL premix        1,000 mg 200 mL/hr over 60 Minutes Intravenous  Once 08/08/23 1336 08/08/23 1801       Subjective: Seen and examined at bedside.  Overnight events noted. Patient reports doing much better.  He was sitting on the chair,  states he had dark-colored stool.  Denies any abdominal pain.  Objective: Vitals:   08/12/23 1325 08/12/23 2028 08/13/23 0314 08/13/23 1139  BP: 120/80 112/72 108/76 101/71  Pulse: (!) 101 89 98 90  Resp: 18 19  20   Temp: (!) 97.5 F (36.4 C) (!) 97.5 F (36.4 C) (!) 97.4 F (36.3 C) (!) 97.5 F (36.4 C)  TempSrc:  Oral Oral Oral  SpO2: 97% 98% 98% 100%  Weight:      Height:        Intake/Output Summary (Last 24 hours) at 08/13/2023 1209 Last data filed at 08/12/2023 2230 Gross per 24 hour  Intake 360 ml  Output --  Net 360 ml   Filed Weights   08/11/23 0500 08/12/23 0100 08/12/23 0238  Weight: 53.1 kg 55 kg 55.3 kg    Examination:  General exam: Appears  comfortable, sick looking, not in any acute distress. Respiratory system: CTA Bilaterally. Respiratory effort normal.  RR 15 Cardiovascular system: S1 & S2 heard, RRR. No JVD, murmurs, rubs, gallops or clicks. Gastrointestinal system: Abdomen is distended, soft and Mildly tender.  Normal bowel sounds heard. Central nervous system: Alert and oriented x 3. No focal neurological deficits. Extremities: Edema+, no cyanosis, no clubbing Skin: No rashes, lesions or ulcers Psychiatry: Judgement and insight appear normal. Mood & affect appropriate.     Data Reviewed: I have personally reviewed following labs and imaging studies  CBC: Recent Labs  Lab 08/08/23 1336 08/08/23 1610 08/09/23 0251 08/09/23 0835 08/10/23 0537 08/10/23 1556 08/10/23 2156 08/11/23 0534 08/12/23 0423 08/13/23 0755  WBC 16.2* 13.7* 17.8*  --  19.9*  --   --   19.9* 21.5* 18.2*  NEUTROABS 11.9* 10.7*  --   --   --   --   --   --   --   --   HGB 3.3* 3.4* 8.3*   < > 6.8* 8.5* 8.4* 8.3* 8.8* 8.0*  HCT 9.7* 9.7* 23.7*   < > 19.2* 25.5* 24.4* 23.7* 26.0* 24.3*  MCV 109.0* 107.8* 92.6  --  92.8  --   --  90.1 92.5 94.2  PLT 211 136* 128*  --  88*  --   --  85* 72* 70*   < > = values in this interval not displayed.   Basic Metabolic Panel: Recent Labs  Lab 08/09/23 0252 08/09/23 2216 08/10/23 0537 08/11/23 0534 08/12/23 0423 08/13/23 0755  NA  --  138 136 135 137 144  K  --  3.3* 3.7 3.5 3.2* 3.4*  CL  --  106 104 105 105 113*  CO2  --  21* 20* 19* 20* 21*  GLUCOSE  --  97 136* 140* 159* 154*  BUN  --  71* 62* 50* 41* 36*  CREATININE  --  1.16 0.91 0.84 0.78 0.58*  CALCIUM  --  7.8* 8.0* 7.9* 8.1* 8.8*  MG 1.7  --   --   --   --   --   PHOS 2.8  --   --   --   --   --    GFR: Estimated Creatinine Clearance: 66.2 mL/min (A) (by C-G formula based on SCr of 0.58 mg/dL (L)). Liver Function Tests: Recent Labs  Lab 08/09/23 0251 08/10/23 0537 08/11/23 0534 08/12/23 0423 08/13/23 0755  AST 201* 154* 142* 129* 144*  ALT 58* 47* 53* 63* 78*  ALKPHOS 62 48 52 57 61  BILITOT 15.3* 16.9* 17.1* 17.4* 18.7*  PROT 6.1* 6.3* 6.3* 6.4* 6.3*  ALBUMIN  1.6* 2.6* 2.5* 2.3* 2.1*   Recent Labs  Lab 08/10/23 0537 08/12/23 0423  LIPASE 679* 354*   Recent Labs  Lab 08/08/23 1420  AMMONIA 122*   Coagulation Profile: Recent Labs  Lab 08/08/23 1336 08/09/23 0252 08/10/23 0537 08/11/23 0534  INR 3.0* 2.6* 2.8* 2.2*   Cardiac Enzymes: Recent Labs  Lab 08/08/23 1610  CKTOTAL 14*   BNP (last 3 results) No results for input(s): "PROBNP" in the last 8760 hours. HbA1C: Recent Labs    08/10/23 2156  HGBA1C 4.7*   CBG: Recent Labs  Lab 08/11/23 1959 08/12/23 1117 08/12/23 1755 08/13/23 0724 08/13/23 1118  GLUCAP 153* 139* 138* 128* 160*   Lipid Profile: No results for input(s): "CHOL", "HDL", "LDLCALC", "TRIG", "CHOLHDL",  "LDLDIRECT" in the last 72 hours. Thyroid Function Tests: No results for input(s): "TSH", "T4TOTAL", "FREET4", "T3FREE", "THYROIDAB" in the last 72 hours. Anemia Panel: No results for input(s): "VITAMINB12", "FOLATE", "FERRITIN", "TIBC", "IRON", "RETICCTPCT" in the last 72 hours. Sepsis Labs: Recent Labs  Lab 08/08/23 1417 08/08/23 1624  LATICACIDVEN 7.2* 6.7*    Recent Results (from the past 240 hours)  Blood Culture (routine x 2)     Status: None   Collection Time: 08/08/23  2:17 PM   Specimen: BLOOD  Result Value Ref Range Status   Specimen Description   Final    BLOOD BLOOD RIGHT HAND Performed at [facility], [address], [place], [place] [address]    Special Requests   Final    BOTTLES DRAWN AEROBIC ONLY Blood Culture results may not be optimal due to an inadequate volume of blood received in culture bottles Performed at [facility], [address] [place], [place] [address]    Culture   Final    NO GROWTH 5 DAYS Performed at [facility], [address], [place], [place] [address]    Report Status 08/13/2023 FINAL  Final  Blood Culture (routine x 2)     Status: None   Collection Time: 08/08/23  2:17 PM   Specimen: BLOOD  Result Value Ref Range Status   Specimen Description   Final    BLOOD LEFT ANTECUBITAL Performed at [facility], [address], [place], [place] [address]    Special Requests   Final    BOTTLES DRAWN AEROBIC AND ANAEROBIC Blood Culture adequate volume Performed at [facility], [address] [place], [place] [address]    Culture   Final    NO GROWTH 5 DAYS Performed at [facility], [address] [place], [place] [address]    Report Status 08/13/2023 FINAL  Final  Resp panel by RT-PCR (RSV, Flu A&B, Covid) Anterior Nasal Swab     Status: None  Collection Time: 08/08/23  4:50 PM   Specimen: Anterior Nasal Swab  Result Value Ref Range Status    SARS Coronavirus 2 by RT PCR NEGATIVE NEGATIVE Final    Comment: (NOTE) SARS-CoV-2 target nucleic acids are NOT DETECTED.  The SARS-CoV-2 RNA is generally detectable in upper respiratory specimens during the acute phase of infection. The lowest concentration of SARS-CoV-2 viral copies this assay can detect is 138 copies/mL. A negative result does not preclude SARS-Cov-2 infection and should not be used as the sole basis for treatment or other patient management decisions. A negative result may occur with  improper specimen collection/handling, submission of specimen other than nasopharyngeal swab, presence of viral mutation(s) within the areas targeted by this assay, and inadequate number of viral copies(<138 copies/mL). A negative result must be combined with clinical observations, patient history, and epidemiological information. The expected result is Negative.  Fact Sheet for Patients:  BloggerCourse.com  Fact Sheet for Healthcare Providers:  SeriousBroker.it  This test is no t yet approved or cleared by the [place]  FDA and  has been authorized for detection and/or diagnosis of SARS-CoV-2 by FDA under an Emergency Use Authorization (EUA). This EUA will remain  in effect (meaning this test can be used) for the duration of the COVID-19 declaration under Section 564(b)(1) of the Act, 21 U.S.C.section 360bbb-3(b)(1), unless the authorization is terminated  or revoked sooner.       Influenza A by PCR NEGATIVE NEGATIVE Final   Influenza B by PCR NEGATIVE NEGATIVE Final    Comment: (NOTE) The Xpert Xpress SARS-CoV-2/FLU/RSV plus assay is intended as an aid in the diagnosis of influenza from Nasopharyngeal swab specimens and should not be used as a sole basis for treatment. Nasal washings and aspirates are unacceptable for Xpert Xpress SARS-CoV-2/FLU/RSV testing.  Fact Sheet for  Patients: BloggerCourse.com  Fact Sheet for Healthcare Providers: SeriousBroker.it  This test is not yet approved or cleared by the [place]  FDA and has been authorized for detection and/or diagnosis of SARS-CoV-2 by FDA under an Emergency Use Authorization (EUA). This EUA will remain in effect (meaning this test can be used) for the duration of the COVID-19 declaration under Section 564(b)(1) of the Act, 21 U.S.C. section 360bbb-3(b)(1), unless the authorization is terminated or revoked.     Resp Syncytial Virus by PCR NEGATIVE NEGATIVE Final    Comment: (NOTE) Fact Sheet for Patients: BloggerCourse.com  Fact Sheet for Healthcare Providers: SeriousBroker.it  This test is not yet approved or cleared by the [place]  FDA and has been authorized for detection and/or diagnosis of SARS-CoV-2 by FDA under an Emergency Use Authorization (EUA). This EUA will remain in effect (meaning this test can be used) for the duration of the COVID-19 declaration under Section 564(b)(1) of the Act, 21 U.S.C. section 360bbb-3(b)(1), unless the authorization is terminated or revoked.  Performed at [facility], [address], [place], [place] [address]   MRSA Next Gen by PCR, Nasal     Status: None   Collection Time: 08/08/23  8:52 PM   Specimen: Nasal Mucosa; Nasal Swab  Result Value Ref Range Status   MRSA by PCR Next Gen NOT DETECTED NOT DETECTED Final    Comment: (NOTE) The GeneXpert MRSA Assay (FDA approved for NASAL specimens only), is one component of a comprehensive MRSA colonization surveillance program. It is not intended to diagnose MRSA infection nor to guide or monitor treatment for MRSA infections. Test performance is not FDA approved in patients less than 2  years old. Performed at [facility], [address] [place], [place]  [address]   Body fluid culture w Gram Stain     Status: None (Preliminary result)   Collection Time: 08/10/23 12:18 PM   Specimen: Peritoneal Cavity; Peritoneal Fluid  Result Value Ref Range Status   Specimen Description   Final    PERITONEAL CAVITY Performed at [facility], [address] [place], [place] [address]    Special Requests   Final    NONE Performed at [facility], [address], [place], [place] [address]    Gram Stain   Final    WBC PRESENT, PREDOMINANTLY PMN NO ORGANISMS SEEN CYTOSPIN SMEAR    Culture   Final    NO GROWTH 3 DAYS Performed at [facility], [address] [place], [place] [address]    Report Status PENDING  Incomplete    Radiology Studies: No results found.  Scheduled Meds:  feeding supplement  237 mL Oral BID BM   influenza vaccine adjuvanted  0.5 mL Intramuscular Tomorrow-1000   insulin  aspart  0-6 Units Subcutaneous TID WC   lactulose   30 g Oral TID   lipase/protease/amylase  36,000 Units Oral TID AC   midodrine   10 mg Oral TID WC   multivitamin with minerals  1 tablet Oral Daily   pantoprazole  (PROTONIX ) IV  40 mg Intravenous Q12H   pneumococcal 20-valent conjugate vaccine  0.5 mL Intramuscular Tomorrow-1000   prednisoLONE   40 mg Oral Daily   Continuous Infusions:  piperacillin -tazobactam (ZOSYN )  IV 3.375 g (08/13/23 0839)     LOS: 5 days    Time spent: 35 mins    Magdalene School, MD [facility]   If 7PM-7AM, please contact night-coverage

## 2023-08-13 NOTE — TOC Progression Note (Addendum)
 Transition of Care [facility]) - Progression Note    Patient Details  Name: Kevin Velez MRN: [id number] Date of Birth: 1950-12-16  Transition of Care [facility]) CM/SW Contact  Levie Ream, RN Phone Number: 08/13/2023, 10:14 AM  Clinical Narrative:    Notified by Sallyanne Creamer at [facility] that ins [employer] has been received; start 08/12/27 end 08/16/23;  Dr Elsworth Halt notified via secure chat; awaiting response.  -[id number]- notified by Dr Elsworth Halt pt is not ready for d/c; EGD scheduled for 4/27; Rhonda at facility notified.  Expected Discharge Plan: Skilled Nursing Facility Barriers to Discharge: Continued Medical Work up, Other (must enter comment) (await bed choice)  Expected Discharge Plan and Services In-house Referral: Clinical Social Work     Living arrangements for the past 2 months: Single Family Home                 DME Arranged: N/A DME Agency: NA                   Social Determinants of Health (SDOH) Interventions SDOH Screenings   Food Insecurity: Patient Unable To Answer (08/09/2023)  Housing: Patient Unable To Answer (08/09/2023)  Transportation Needs: Patient Unable To Answer (08/09/2023)  Utilities: Patient Unable To Answer (08/09/2023)  Social Connections: Patient Unable To Answer (08/09/2023)  Tobacco Use: High Risk (08/09/2023)    Readmission Risk Interventions    08/11/2023    2:18 PM  Readmission Risk Prevention Plan  Transportation Screening Complete  HRI or Home Care Consult Complete  Social Work Consult for Recovery Care Planning/Counseling Complete  Palliative Care Screening Not Applicable  Medication Review Oceanographer) Complete

## 2023-08-14 ENCOUNTER — Inpatient Hospital Stay (HOSPITAL_COMMUNITY): Admitting: Certified Registered Nurse Anesthetist

## 2023-08-14 ENCOUNTER — Encounter (HOSPITAL_COMMUNITY)
Admission: EM | Disposition: A | Discharge status: Skilled Nursing Facility | Payer: Self-pay | Source: Home / Self Care | Attending: Pulmonary Disease

## 2023-08-14 ENCOUNTER — Other Ambulatory Visit: Payer: Self-pay

## 2023-08-14 ENCOUNTER — Encounter (HOSPITAL_COMMUNITY): Payer: Self-pay | Admitting: Pulmonary Disease

## 2023-08-14 DIAGNOSIS — K7200 Acute and subacute hepatic failure without coma: Secondary | ICD-10-CM | POA: Diagnosis not present

## 2023-08-14 DIAGNOSIS — K921 Melena: Secondary | ICD-10-CM

## 2023-08-14 DIAGNOSIS — K7460 Unspecified cirrhosis of liver: Secondary | ICD-10-CM | POA: Diagnosis not present

## 2023-08-14 DIAGNOSIS — K922 Gastrointestinal hemorrhage, unspecified: Secondary | ICD-10-CM | POA: Diagnosis not present

## 2023-08-14 DIAGNOSIS — K7201 Acute and subacute hepatic failure with coma: Secondary | ICD-10-CM | POA: Diagnosis not present

## 2023-08-14 DIAGNOSIS — K7682 Hepatic encephalopathy: Secondary | ICD-10-CM

## 2023-08-14 DIAGNOSIS — R579 Shock, unspecified: Secondary | ICD-10-CM | POA: Diagnosis not present

## 2023-08-14 HISTORY — PX: ESOPHAGOGASTRODUODENOSCOPY: SHX5428

## 2023-08-14 HISTORY — PX: SCLEROTHERAPY: SHX6841

## 2023-08-14 HISTORY — PX: HOT HEMOSTASIS: SHX5433

## 2023-08-14 LAB — GLUCOSE, CAPILLARY
Glucose-Capillary: 137 mg/dL — ABNORMAL HIGH (ref 70–99)
Glucose-Capillary: 82 mg/dL (ref 70–99)
Glucose-Capillary: 79 mg/dL (ref 70–99)
Glucose-Capillary: 197 mg/dL — ABNORMAL HIGH (ref 70–99)

## 2023-08-14 LAB — BODY FLUID CULTURE W GRAM STAIN: Culture: NO GROWTH

## 2023-08-14 LAB — HEMOGLOBIN AND HEMATOCRIT, BLOOD
HCT: 25.8 % — ABNORMAL LOW (ref 39.0–52.0)
HCT: 16.3 % — ABNORMAL LOW (ref 39.0–52.0)
Hemoglobin: 8.6 g/dL — ABNORMAL LOW (ref 13.0–17.0)
Hemoglobin: 5.6 g/dL — CL (ref 13.0–17.0)

## 2023-08-14 LAB — PHOSPHORUS: Phosphorus: 1.9 mg/dL — ABNORMAL LOW (ref 2.5–4.6)

## 2023-08-14 LAB — COMPREHENSIVE METABOLIC PANEL WITH GFR
ALT: 79 U/L — ABNORMAL HIGH (ref 0–44)
AST: 139 U/L — ABNORMAL HIGH (ref 15–41)
Albumin: 1.8 g/dL — ABNORMAL LOW (ref 3.5–5.0)
Alkaline Phosphatase: 55 U/L (ref 38–126)
Anion gap: 10 (ref 5–15)
BUN: 40 mg/dL — ABNORMAL HIGH (ref 8–23)
CO2: 19 mmol/L — ABNORMAL LOW (ref 22–32)
Calcium: 8.7 mg/dL — ABNORMAL LOW (ref 8.9–10.3)
Chloride: 116 mmol/L — ABNORMAL HIGH (ref 98–111)
Creatinine, Ser: 0.91 mg/dL (ref 0.61–1.24)
GFR, Estimated: >60 mL/min (ref >60)
Glucose, Bld: 145 mg/dL — ABNORMAL HIGH (ref 70–99)
Potassium: 3.7 mmol/L (ref 3.5–5.1)
Sodium: 145 mmol/L (ref 135–145)
Total Bilirubin: 16.8 mg/dL — ABNORMAL HIGH (ref 0.0–1.2)
Total Protein: 5.5 g/dL — ABNORMAL LOW (ref 6.5–8.1)

## 2023-08-14 LAB — PREPARE RBC (CROSSMATCH)

## 2023-08-14 LAB — MAGNESIUM: Magnesium: 1.9 mg/dL (ref 1.7–2.4)

## 2023-08-14 LAB — CBC
HCT: 19.2 % — ABNORMAL LOW (ref 39.0–52.0)
Hemoglobin: 6.4 g/dL — CL (ref 13.0–17.0)
MCH: 30.9 pg (ref 26.0–34.0)
MCHC: 33.3 g/dL (ref 30.0–36.0)
MCV: 92.8 fL (ref 80.0–100.0)
Platelets: 77 10*3/uL — ABNORMAL LOW (ref 150–400)
RBC: 2.07 MIL/uL — ABNORMAL LOW (ref 4.22–5.81)
RDW: 20.4 % — ABNORMAL HIGH (ref 11.5–15.5)
WBC: 18.6 10*3/uL — ABNORMAL HIGH (ref 4.0–10.5)
nRBC: 1.5 % — ABNORMAL HIGH (ref 0.0–0.2)

## 2023-08-14 LAB — MRSA NEXT GEN BY PCR, NASAL: MRSA by PCR Next Gen: NOT DETECTED

## 2023-08-14 LAB — PROTIME-INR
INR: 2.2 — ABNORMAL HIGH (ref 0.8–1.2)
Prothrombin Time: 24.8 s — ABNORMAL HIGH (ref 11.4–15.2)

## 2023-08-14 SURGERY — EGD (ESOPHAGOGASTRODUODENOSCOPY)
Anesthesia: Monitor Anesthesia Care

## 2023-08-14 MED ORDER — LIDOCAINE HCL (CARDIAC) PF 100 MG/5ML IV SOSY
PREFILLED_SYRINGE | INTRAVENOUS | Status: DC | PRN
Start: 2023-08-14 — End: 2023-08-14
  Administered 2023-08-14: 50 mg via INTRAVENOUS

## 2023-08-14 MED ORDER — ALBUMIN HUMAN 25 % IV SOLN
25.0000 g | Freq: Once | INTRAVENOUS | Status: AC
  Administered 2023-08-14: 25 g via INTRAVENOUS
  Filled 2023-08-14: qty 100

## 2023-08-14 MED ORDER — OCTREOTIDE LOAD VIA INFUSION
100.0000 ug | Freq: Once | INTRAVENOUS | Status: AC
  Administered 2023-08-14: 100 ug via INTRAVENOUS
  Filled 2023-08-14: qty 50

## 2023-08-14 MED ORDER — SODIUM CHLORIDE 0.9% IV SOLUTION: Freq: Once | INTRAVENOUS | Status: AC

## 2023-08-14 MED ORDER — VASOPRESSIN 20 UNIT/ML IV SOLN
INTRAVENOUS | Status: AC
  Filled 2023-08-14: qty 1

## 2023-08-14 MED ORDER — SODIUM CHLORIDE 0.9% FLUSH: 10.0000 mL | INTRAVENOUS | Status: DC | PRN

## 2023-08-14 MED ORDER — NOREPINEPHRINE 4 MG/250ML-% IV SOLN
INTRAVENOUS | Status: AC
  Administered 2023-08-14: 2 ug/min via INTRAVENOUS
  Filled 2023-08-14: qty 250

## 2023-08-14 MED ORDER — SODIUM CHLORIDE 0.9 % IV BOLUS
500.0000 mL | Freq: Once | INTRAVENOUS | Status: AC
  Administered 2023-08-14: 500 mL via INTRAVENOUS

## 2023-08-14 MED ORDER — EPINEPHRINE 1 MG/10ML IJ SOSY
PREFILLED_SYRINGE | INTRAMUSCULAR | Status: AC
  Filled 2023-08-14: qty 10

## 2023-08-14 MED ORDER — NOREPINEPHRINE 4 MG/250ML-% IV SOLN: 0.0000 ug/min | INTRAVENOUS | Status: DC

## 2023-08-14 MED ORDER — VASOPRESSIN 20 UNIT/ML IV SOLN
INTRAVENOUS | Status: DC | PRN
  Administered 2023-08-14 (×2): 2 [IU] via INTRAVENOUS

## 2023-08-14 MED ORDER — ALBUMIN HUMAN 5 % IV SOLN
INTRAVENOUS | Status: AC
Start: 2023-08-14 — End: ?
  Filled 2023-08-14: qty 500

## 2023-08-14 MED ORDER — SODIUM CHLORIDE (PF) 0.9 % IJ SOLN
PREFILLED_SYRINGE | INTRAMUSCULAR | Status: DC | PRN
  Administered 2023-08-14: 4 mL

## 2023-08-14 MED ORDER — CHLORHEXIDINE GLUCONATE CLOTH 2 % EX PADS
6.0000 | MEDICATED_PAD | Freq: Every day | CUTANEOUS | Status: DC
  Administered 2023-08-14 – 2023-08-19 (×7): 6 via TOPICAL

## 2023-08-14 MED ORDER — PROPOFOL 500 MG/50ML IV EMUL
INTRAVENOUS | Status: DC | PRN
  Administered 2023-08-14: 75 ug/kg/min via INTRAVENOUS

## 2023-08-14 MED ORDER — SODIUM CHLORIDE 0.9 % IV SOLN
50.0000 ug/h | INTRAVENOUS | Status: DC
  Administered 2023-08-14: 50 ug/h via INTRAVENOUS
  Filled 2023-08-14: qty 1

## 2023-08-14 MED ORDER — NOREPINEPHRINE 4 MG/250ML-% IV SOLN
0.0000 ug/min | INTRAVENOUS | Status: DC
  Administered 2023-08-14: 16 ug/min via INTRAVENOUS
  Administered 2023-08-14: 11 ug/min via INTRAVENOUS
  Administered 2023-08-15: 5 ug/min via INTRAVENOUS
  Administered 2023-08-15 – 2023-08-16 (×2): 6 ug/min via INTRAVENOUS
  Administered 2023-08-17: 2 ug/min via INTRAVENOUS
  Filled 2023-08-14 (×7): qty 250

## 2023-08-14 MED ORDER — SODIUM CHLORIDE 0.9% IV SOLUTION: Freq: Once | INTRAVENOUS | Status: DC

## 2023-08-14 MED ORDER — SODIUM CHLORIDE 0.9 % IV BOLUS
1000.0000 mL | Freq: Once | INTRAVENOUS | Status: AC
  Administered 2023-08-14: 1000 mL via INTRAVENOUS

## 2023-08-14 MED ORDER — POTASSIUM PHOSPHATES 15 MMOLE/5ML IV SOLN
30.0000 mmol | Freq: Once | INTRAVENOUS | Status: AC
  Administered 2023-08-14: 30 mmol via INTRAVENOUS
  Filled 2023-08-14: qty 10

## 2023-08-14 MED ORDER — SODIUM CHLORIDE 0.9% FLUSH
10.0000 mL | Freq: Two times a day (BID) | INTRAVENOUS | Status: DC
  Administered 2023-08-14 – 2023-08-16 (×4): 10 mL
  Administered 2023-08-17: 20 mL
  Administered 2023-08-17: 10 mL
  Administered 2023-08-18: 30 mL
  Administered 2023-08-18 – 2023-08-19 (×2): 10 mL
  Administered 2023-08-19: 20 mL

## 2023-08-14 MED ORDER — PHENYLEPHRINE 80 MCG/ML (10ML) SYRINGE FOR IV PUSH (FOR BLOOD PRESSURE SUPPORT)
PREFILLED_SYRINGE | INTRAVENOUS | Status: DC | PRN
  Administered 2023-08-14: 120 ug via INTRAVENOUS
  Administered 2023-08-14: 160 ug via INTRAVENOUS

## 2023-08-14 MED ORDER — SODIUM CHLORIDE 0.9 % IV SOLN: 250.0000 mL | INTRAVENOUS | Status: AC

## 2023-08-14 MED ORDER — LACTATED RINGERS IV SOLN: INTRAVENOUS | Status: DC | PRN

## 2023-08-14 MED ORDER — PROPOFOL 10 MG/ML IV BOLUS
INTRAVENOUS | Status: DC | PRN
  Administered 2023-08-14: 20 mg via INTRAVENOUS

## 2023-08-14 NOTE — Plan of Care (Signed)
  Problem: Education: Goal: Ability to describe self-care measures that may prevent or decrease complications (Diabetes Survival Skills Education) will improve Outcome: Progressing Goal: Individualized Educational Video(s) Outcome: Progressing   Problem: Coping: Goal: Ability to adjust to condition or change in health will improve Outcome: Progressing   Problem: Fluid Volume: Goal: Ability to maintain a balanced intake and output will improve Outcome: Progressing   Problem: Metabolic: Goal: Ability to maintain appropriate glucose levels will improve Outcome: Progressing   Problem: Tissue Perfusion: Goal: Adequacy of tissue perfusion will improve Outcome: Progressing   Problem: Clinical Measurements: Goal: Ability to maintain clinical measurements within normal limits will improve Outcome: Progressing Goal: Will remain free from infection Outcome: Progressing Goal: Diagnostic test results will improve Outcome: Progressing Goal: Respiratory complications will improve Outcome: Progressing Goal: Cardiovascular complication will be avoided Outcome: Progressing   Problem: Elimination: Goal: Will not experience complications related to bowel motility Outcome: Progressing Goal: Will not experience complications related to urinary retention Outcome: Progressing   Problem: Pain Managment: Goal: General experience of comfort will improve and/or be controlled Outcome: Progressing   Problem: Safety: Goal: Ability to remain free from injury will improve Outcome: Progressing

## 2023-08-14 NOTE — Op Note (Signed)
 [facility] Patient Name: Kevin Velez Procedure Date: 08/14/2023 MRN: [id number] Attending MD: Felecia Hopper , MD, [id number] Date of Birth: 01/06/1951 CSN: [id number] Age: 73 Admit Type: Inpatient Procedure:                Upper GI endoscopy Indications:              Active gastrointestinal bleeding Providers:                Felecia Hopper, MD, Perla Bradford, RN, Joline Ned,                            Technician Referring MD:              Medicines:                Sedation Administered by an Anesthesia Professional Complications:            No immediate complications. Estimated Blood Loss:     Estimated blood loss was minimal. Procedure:                Pre-Anesthesia Assessment:                           - Prior to the procedure, a History and Physical                            was performed, and patient medications and                            allergies were reviewed. The patient's tolerance of                            previous anesthesia was also reviewed. The risks                            and benefits of the procedure and the sedation                            options and risks were discussed with the patient.                            All questions were answered, and informed consent                            was obtained. Prior Anticoagulants: The patient has                            taken no anticoagulant or antiplatelet agents. ASA                            Grade Assessment: IV - A patient with severe                            systemic disease that is a constant threat to life.  After reviewing the risks and benefits, the patient                            was deemed in satisfactory condition to undergo the                            procedure.                           After obtaining informed consent, the endoscope was                            passed under direct vision. Throughout the                             procedure, the patient's blood pressure, pulse, and                            oxygen saturations were monitored continuously. The                            [id number]-H190 ([id number]) Olympus endoscope was introduced                            through the mouth, and advanced to the second part                            of duodenum. The upper GI endoscopy was technically                            difficult and complex due to excessive bleeding.                            The patient tolerated the procedure well. Scope In: Scope Out: Findings:      Small (< 5 mm) varices were found in the distal esophagus.      A small hiatal hernia was present.      Clotted blood was found in the gastric fundus and in the gastric body.       There was a large blood clot in the fundus. Scope was changed to       therapeutic scope and blood clots was removed using suction as well as a       Roth net. Did not find any bleeding in the cardia. No evidence of       gastric varices.      Red blood was found in the entire examined stomach.      Red blood was found in the entire duodenum.      One spurting cratered duodenal ulcer with a visible vessel was found in       the first portion of the duodenum. The lesion was 20 mm in largest       dimension. Area was unsuccessfully injected with 2 mL of a 0.1 mg/mL       solution of epinephrine for hemostasis. Fulguration to ablate the lesion       by monopolar probe was successful. Purastat  was applied. Impression:               - Small (< 5 mm) esophageal varices.                           - Small hiatal hernia.                           - Clotted blood in the gastric fundus and in the                            gastric body.                           - Red blood in the entire stomach.                           - Blood in the entire examined duodenum.                           - Spurting duodenal ulcer with a visible vessel.                            Treatment not  successful. Treated with a monopolar                            probe.                           - No specimens collected. Moderate Sedation:      Moderate (conscious) sedation was personally administered by an       anesthesia professional. The following parameters were monitored: oxygen       saturation, heart rate, blood pressure, and response to care. Recommendation:           - Return patient to ICU for ongoing care.                           - NPO.                           - Continue present medications.                           - Refer to an interventional radiologist if                            symptoms persist. Procedure Code(s):        --- Professional ---                           [phone], Esophagogastroduodenoscopy, flexible,                            transoral; with control of bleeding, any method Diagnosis Code(s):        --- Professional ---  I85.00, Esophageal varices without bleeding                           K44.9, Diaphragmatic hernia without obstruction or                            gangrene                           K92.2, Gastrointestinal hemorrhage, unspecified                           K26.4, Chronic or unspecified duodenal ulcer with                            hemorrhage CPT copyright 2022 American [employer]. All rights reserved. The codes documented in this report are preliminary and upon coder review may  be revised to meet current compliance requirements. Felecia Hopper, MD Felecia Hopper, MD 08/14/2023 4:15:16 PM Number of Addenda: 0

## 2023-08-14 NOTE — Transfer of Care (Signed)
 Immediate Anesthesia Transfer of Care Note  Patient: Kevin Velez  Procedure(s) Performed: EGD (ESOPHAGOGASTRODUODENOSCOPY) SCLEROTHERAPY EGD, WITH ARGON PLASMA COAGULATION/GOLD PROBE  Patient Location: ICU  Anesthesia Type:MAC  Level of Consciousness: awake  Airway & Oxygen Therapy: Patient Spontanous Breathing and Patient connected to nasal cannula oxygen  Post-op Assessment: Report given to RN and Post -op Vital signs reviewed and stable  Post vital signs: Reviewed and stable  Last Vitals:  Vitals Value Taken Time  BP    Temp    Pulse    Resp    SpO2      Last Pain:  Vitals:   08/14/23 1504  TempSrc:   PainSc: 0-No pain      Patients Stated Pain Goal: 0 (08/12/23 0830)  Complications: No notable events documented.

## 2023-08-14 NOTE — Brief Op Note (Signed)
 08/14/2023  4:15 PM  PATIENT:  Kevin Velez  73 y.o. male  PRE-OPERATIVE DIAGNOSIS:  Melena, cirrhosis  POST-OPERATIVE DIAGNOSIS:  Epi Gold probe PuraStat  PROCEDURE:  Procedure(s): EGD (ESOPHAGOGASTRODUODENOSCOPY) (N/A) SCLEROTHERAPY EGD, WITH ARGON PLASMA COAGULATION/GOLD PROBE (N/A)  SURGEON:  Surgeons and Role:    * Nevaan Bunton, MD - Primary  Findings -------------- - EGD showed large duodenal ulcer in the duodenal sweep with very large visible vessel and active bleeding.  Treated with epinephrine injection, gold probe cautery and pleural stent placement. - Large amount of blood cut in the stomach which was removed.  Did not find any gastric varices. - Small nonbleeding esophageal varices  Recommendations ----------------------- - Continue Protonix  IV twice daily - DC octreotide - If ongoing bleeding, drop in hemoglobin or hypotension, recommend IR consult for GDA embolization. - Monitor H&H.  Transfuse if hemoglobin less than 7.  Felecia Hopper MD, FACP 08/14/2023, 4:17 PM  Contact #  [phone]

## 2023-08-14 NOTE — Progress Notes (Signed)
 Rapid Response Event Note   Reason for Call : hypotension and rectal bleeding   Initial Focused Assessment: Pt alert and oriented.  Lung sounds decreased on room air.  Pt able to follow commands.  Large abdomen with bowel sounds.  Pt denies pain.  B/P 76/54 (see flow sheet for complete VS).   Large dark bloody BM (currently) hgb 6.4, scheduled for EGD this morning.  Pending to receive a unit of blood.  Interventions: [facility],NP aware pt to receive a Liter of NS IV.  See orders received and initiated.  See flow sheet VS.    Plan of Care: Pt to transfer to SD per [facility], NP for closer monitoring due to lower GI bleed, low hbg and low BP.    Event Summary:   MD Notified: yes Call Time: 0410 Arrival Time: 0415 End Time: 0445  Lavel Rieman Lavern, RN

## 2023-08-14 NOTE — Progress Notes (Addendum)
 0019 Low blood pressure noted.  Contacted NP on call.  Orders given.  0119 Critical lab reported to NP. Orders given.  1610 IV Team consult placed.  Patient is a difficult stick and blood transfusion needed.  0245 Lab unable to get enough blood for Type and Screen.  0300 Attempt x 1 made by Jeanette Milks, RN.  [phone] Type and Screen and IV Placement by IV Team.  0410 Rapid Response Nurse contacted by Furman Jobs, RN for evaluation of patient condition.  5409 Report given to [place], [place]

## 2023-08-14 NOTE — Progress Notes (Signed)
 [facility] Progress Note  Kevin Velez 73 y.o. [birth year]  CC: Cirrhosis, pancreatitis, acute liver failure   Subjective: Patient seen and examined at bedside.  Recent events noted.  Was found to have a large bloody bowel movement this morning followed by hypotension.  Hemoglobin dropped to 6.4.  Currently transferred to ICU.  ROS : Afebrile, negative for chest pain   Objective: Vital signs in last 24 hours: Vitals:   08/14/23 0836 08/14/23 0900  BP:  (!) 80/51  Pulse: (!) 118 (!) 126  Resp: (!) 24 19  Temp: 98.1 F (36.7 C)   SpO2: 99% 99%    Physical Exam: Ill and weak appearing patient , resting in the bed comfortably without any acute distress.   Generalized abdominal discomfort on palpation , abdomen is mildly distended, no peritoneal signs.  Mood and affect normal.  Alert and oriented x 3  Lab Results: Recent Labs    08/13/23 0755 08/14/23 0055  NA 144 145  K 3.4* 3.7  CL 113* 116*  CO2 21* 19*  GLUCOSE 154* 145*  BUN 36* 40*  CREATININE 0.58* 0.91  CALCIUM 8.8* 8.7*  MG  --  1.9  PHOS  --  1.9*   Recent Labs    08/13/23 0755 08/14/23 0055  AST 144* 139*  ALT 78* 79*  ALKPHOS 61 55  BILITOT 18.7* 16.8*  PROT 6.3* 5.5*  ALBUMIN  2.1* 1.8*   Recent Labs    08/13/23 0755 08/14/23 0055  WBC 18.2* 18.6*  HGB 8.0* 6.4*  HCT 24.3* 19.2*  MCV 94.2 92.8  PLT 70* 77*   Recent Labs    08/14/23 0055  LABPROT 24.8*  INR 2.2*      Assessment/Plan: - -Acute liver failure presented with encephalopathy and elevated INR on admission.  Requiring intubation in ED. NAC infusion discontinued recently.  alert and oriented x 3.  INR improving.  Abnormal LFTs with jaundice with T. bili of 17.4.  CT scan showed cirrhosis of the liver with possible pancreatitis.  Possible etiology includes acute liver failure, decompensated cirrhosis versus acute alcoholic hepatitis.  -Severe anemia.  Hemoglobin was 3.3 on admission on August 08, 2023.  Hemoglobin  8.8 today.  - Melena: Intermittent dark stool this morning.  Hemoglobin stable.  Recommendations -------------------------- -Patient had a large bloody bowel movement this morning followed by hypotension.  Hemoglobin dropped to 6.4.  Currently on pressor support.  Is about to get 2 units of blood transfusion.  -He is scheduled for EGD today.  Started him on octreotide.  Continue IV vitamin K.  - Started on prednisone on August 09, 2023 for presumed alcoholic hepatitis.  Currently also on midodrine , IV twice daily PPI, Creon  and Zosyn .  Risks (bleeding, infection, bowel perforation that could require surgery, sedation-related changes in cardiopulmonary systems), benefits (identification and possible treatment of source of symptoms, exclusion of certain causes of symptoms), and alternatives (watchful waiting, radiographic imaging studies, empiric medical treatment)  were explained to patient/family in detail and patient wishes to proceed.    Felecia Hopper MD, FACP 08/14/2023, 10:49 AM  Contact #  [phone]

## 2023-08-14 NOTE — Progress Notes (Addendum)
 PROGRESS NOTE    Kevin Velez  ZOX:[id number] DOB: April 06, 1951 DOA: 08/08/2023 PCP: Patient, No Pcp Per   Brief Narrative:  This 73 years old male with unknown past medical history who lives in a group home presented in the ED with several days history of worsening confusion, inability to get out of bed, weakness, also found to be hypotensive which is improving with fluids.  He was significantly anemic with a hemoglobin of 3.0 on arrival. He was Encephalopathic with elevated INR and elevated LFTs, also has acute kidney injury on arrival so he was subsequently intubated in the ED due to inability to protect airway.  Patient was admitted in the ICU. Subsequently extubated. GI is following. PCCM pickup 4/25.  Patient continued to have bloody bowel movements.  Patient is scheduled for EGD today.  Patient is moved to ICU due to hypotension requiring Levophed .  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 Patient presented with encephalopathy, elevated INR, elevated LFTs, AKI meets all criteria for Fulminant liver failure. Ultrasound reveals underlying cirrhosis with ascites, no biliary dilatation.   Patient reports he had returned to drinking and was drinking fairly heavily. Discontinue NAC infusion, Tylenol  level undetectable. Patient was started on methylprednisone.  GI consult appreciated. INR remains elevated.  Vitamin K given. Plan for EGD today.  Septic shock /severe sepsis: Patient presented with hypotension. Status post fluid resuscitation, blood pressure improved.   Continue midodrine  10 mg 3 times daily. Continue IV Zosyn  for 5 days. Blood cultures no growth so far, urine cultures unremarkable. Patient transferred to ICU and started on IV Levophed  support. Patient now on max dose of Levophed .  ICU notified.  Acute kidney  injury: > Resolved. Suspect hypovolemic given hypotension. Renal function  improved and back to normal.  Elevated bilirubin. Obstruction not apparent on ultrasound.   There is a concern for alcoholic hepatitis. Continue to trend liver enzymes.  Elevated INR: INR 3.0 on admission presumably related to liver failure. Status post vitamin K 10 mg on admission repeat INR 2.2. Vitamin K 10 mg given. Monitor INR  Severe anemia presumed acute blood loss. Stool for occult blood positive with melanic stools ongoing.   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 INR improving. Status post 1 unit PRBC.   Hb 8.8 yesterday Continue pantoprazole  40 mg IV every 12 hours. GI consult appreciated.  Plan for endoscopy today. Started on octreotide for GI bleed Monitor H&H and transfuse if hemoglobin drops below 7. Transfuse 2 units PRBC.  Continue to monitor H&H  Acute Metabolic encephalopathy: Presumably related to sepsis and liver failure. Minimize sedating medications. Continue lactulose  as per elevated ammonia level.  Hypokalemia : Replaced. Continue to Monitor  Leukocytosis: Likely due to above. Continue to monitor.  Continue Zosyn .  Thrombocytopenia Likely due to alcohol use. Continue to monitor  Pancreatitis: Lipase found to be 354. Continue pain control IV hydration.  DVT prophylaxis: SCDs Code Status: Full code Family Communication: Brother at bed side Disposition Plan:    Status is: Inpatient Remains inpatient appropriate because: Severity of illness. Plan for EGD today.   Consultants:  Gastroenterology PCCM  Procedures:None  Antimicrobials:  Anti-infectives (From admission, onward)    Start     Dose/Rate Route Frequency Ordered Stop   08/09/23 1800  piperacillin -tazobactam (ZOSYN ) IVPB 3.375 g        3.375 g 12.5 mL/hr over 240 Minutes  Intravenous Every 8 hours 08/09/23 1315     08/09/23 1000  piperacillin -tazobactam (ZOSYN ) IVPB 3.375 g  Status:  Discontinued         3.375 g 12.5 mL/hr over 240 Minutes Intravenous Every 12 hours 08/08/23 2023 08/09/23 1315   08/08/23 1345  ceFEPIme  (MAXIPIME ) 2 g in sodium chloride  0.9 % 100 mL IVPB        2 g 200 mL/hr over 30 Minutes Intravenous  Once 08/08/23 1336 08/08/23 1638   08/08/23 1345  metroNIDAZOLE  (FLAGYL ) IVPB 500 mg        500 mg 100 mL/hr over 60 Minutes Intravenous  Once 08/08/23 1336 08/08/23 1801   08/08/23 1345  vancomycin  (VANCOCIN ) IVPB 1000 mg/200 mL premix        1,000 mg 200 mL/hr over 60 Minutes Intravenous  Once 08/08/23 1336 08/08/23 1801       Subjective: Seen and examined at bedside. Overnight events noted. Patient reports doing fine. He was lying in the bed, blood pressure continues to remain low despite on Levophed  support. Patient continued to have melanotic stools.  Scheduled for EGD today.  Objective: Vitals:   08/14/23 0757 08/14/23 0800 08/14/23 0836 08/14/23 0900  BP: 110/62 95/61  (!) 80/51  Pulse: (!) 113 (!) 115 (!) 118 (!) 126  Resp: 17 20 (!) 24 19  Temp: 98 F (36.7 C)  98.1 F (36.7 C)   TempSrc: Oral  Oral   SpO2: 100% 100% 99% 99%  Weight:      Height:        Intake/Output Summary (Last 24 hours) at 08/14/2023 0949 Last data filed at 08/14/2023 0757 Gross per 24 hour  Intake 1882.7 ml  Output --  Net 1882.7 ml   Filed Weights   08/11/23 0500 08/12/23 0100 08/12/23 0238  Weight: 53.1 kg 55 kg 55.3 kg    Examination:  General exam: Appears  comfortable, sick looking, not in any acute distress. Respiratory system: CTA Bilaterally. Respiratory effort normal.  RR 16 Cardiovascular system: S1 & S2 heard, RRR. No JVD, murmurs, rubs, gallops or clicks. Gastrointestinal system: Abdomen is distended, soft and Mildly tender.  Normal bowel sounds heard. Central nervous system: Alert and oriented x 3. No focal neurological deficits. Extremities: Edema+, no cyanosis, no clubbing Skin: No rashes, lesions or ulcers Psychiatry: Judgement and insight appear  normal. Mood & affect appropriate.     Data Reviewed: I have personally reviewed following labs and imaging studies  CBC: Recent Labs  Lab 08/08/23 1336 08/08/23 1610 08/09/23 0251 08/10/23 0537 08/10/23 1556 08/10/23 2156 08/11/23 0534 08/12/23 0423 08/13/23 0755 08/14/23 0055  WBC 16.2* 13.7*   < > 19.9*  --   --  19.9* 21.5* 18.2* 18.6*  NEUTROABS 11.9* 10.7*  --   --   --   --   --   --   --   --   HGB 3.3* 3.4*   < > 6.8*   < > 8.4* 8.3* 8.8* 8.0* 6.4*  HCT 9.7* 9.7*   < > 19.2*   < > 24.4* 23.7* 26.0* 24.3* 19.2*  MCV 109.0* 107.8*   < > 92.8  --   --  90.1 92.5 94.2 92.8  PLT 211 136*   < > 88*  --   --  85* 72* 70* 77*   < > = values in this interval not displayed.   Basic Metabolic Panel: Recent Labs  Lab 08/09/23 0252 08/09/23 2216 08/10/23 0537 08/11/23 0534 08/12/23 0423  08/13/23 0755 08/14/23 0055  NA  --    < > 136 135 137 144 145  K  --    < > 3.7 3.5 3.2* 3.4* 3.7  CL  --    < > 104 105 105 113* 116*  CO2  --    < > 20* 19* 20* 21* 19*  GLUCOSE  --    < > 136* 140* 159* 154* 145*  BUN  --    < > 62* 50* 41* 36* 40*  CREATININE  --    < > 0.91 0.84 0.78 0.58* 0.91  CALCIUM  --    < > 8.0* 7.9* 8.1* 8.8* 8.7*  MG 1.7  --   --   --   --   --  1.9  PHOS 2.8  --   --   --   --   --  1.9*   < > = values in this interval not displayed.   GFR: Estimated Creatinine Clearance: 58.2 mL/min (by C-G formula based on SCr of 0.91 mg/dL). Liver Function Tests: Recent Labs  Lab 08/10/23 0537 08/11/23 0534 08/12/23 0423 08/13/23 0755 08/14/23 0055  AST 154* 142* 129* 144* 139*  ALT 47* 53* 63* 78* 79*  ALKPHOS 48 52 57 61 55  BILITOT 16.9* 17.1* 17.4* 18.7* 16.8*  PROT 6.3* 6.3* 6.4* 6.3* 5.5*  ALBUMIN  2.6* 2.5* 2.3* 2.1* 1.8*   Recent Labs  Lab 08/10/23 0537 08/12/23 0423  LIPASE 679* 354*   Recent Labs  Lab 08/08/23 1420  AMMONIA 122*   Coagulation Profile: Recent Labs  Lab 08/08/23 1336 08/09/23 0252 08/10/23 0537 08/11/23 0534  08/14/23 0055  INR 3.0* 2.6* 2.8* 2.2* 2.2*   Cardiac Enzymes: Recent Labs  Lab 08/08/23 1610  CKTOTAL 14*   BNP (last 3 results) No results for input(s): "PROBNP" in the last 8760 hours. HbA1C: No results for input(s): "HGBA1C" in the last 72 hours.  CBG: Recent Labs  Lab 08/13/23 0724 08/13/23 1118 08/13/23 1639 08/14/23 0426 08/14/23 0751  GLUCAP 128* 160* 165* 137* 82   Lipid Profile: No results for input(s): "CHOL", "HDL", "LDLCALC", "TRIG", "CHOLHDL", "LDLDIRECT" in the last 72 hours. Thyroid Function Tests: No results for input(s): "TSH", "T4TOTAL", "FREET4", "T3FREE", "THYROIDAB" in the last 72 hours. Anemia Panel: No results for input(s): "VITAMINB12", "FOLATE", "FERRITIN", "TIBC", "IRON", "RETICCTPCT" in the last 72 hours. Sepsis Labs: Recent Labs  Lab 08/08/23 1417 08/08/23 1624  LATICACIDVEN 7.2* 6.7*    Recent Results (from the past 240 hours)  Blood Culture (routine x 2)     Status: None   Collection Time: 08/08/23  2:17 PM   Specimen: BLOOD  Result Value Ref Range Status   Specimen Description   Final    BLOOD BLOOD RIGHT HAND Performed at [facility], [address] [place], [place] [address]    Special Requests   Final    BOTTLES DRAWN AEROBIC ONLY Blood Culture results may not be optimal due to an inadequate volume of blood received in culture bottles Performed at [facility], [address] [place], [place] [address]    Culture   Final    NO GROWTH 5 DAYS Performed at [facility], [address] [place], [place] [address]    Report Status 08/13/2023 FINAL  Final  Blood Culture (routine x 2)     Status: None   Collection Time: 08/08/23  2:17 PM   Specimen: BLOOD  Result Value Ref Range  Status   Specimen Description   Final    BLOOD LEFT ANTECUBITAL Performed at [facility], [address] [place], [place] [address]    Special Requests   Final    BOTTLES DRAWN  AEROBIC AND ANAEROBIC Blood Culture adequate volume Performed at [facility], [address], [place], [place] [address]    Culture   Final    NO GROWTH 5 DAYS Performed at [facility], [address], [place], [place] [address]    Report Status 08/13/2023 FINAL  Final  Resp panel by RT-PCR (RSV, Flu A&B, Covid) Anterior Nasal Swab     Status: None   Collection Time: 08/08/23  4:50 PM   Specimen: Anterior Nasal Swab  Result Value Ref Range Status   SARS Coronavirus 2 by RT PCR NEGATIVE NEGATIVE Final    Comment: (NOTE) SARS-CoV-2 target nucleic acids are NOT DETECTED.  The SARS-CoV-2 RNA is generally detectable in upper respiratory specimens during the acute phase of infection. The lowest concentration of SARS-CoV-2 viral copies this assay can detect is 138 copies/mL. A negative result does not preclude SARS-Cov-2 infection and should not be used as the sole basis for treatment or other patient management decisions. A negative result may occur with  improper specimen collection/handling, submission of specimen other than nasopharyngeal swab, presence of viral mutation(s) within the areas targeted by this assay, and inadequate number of viral copies(<138 copies/mL). A negative result must be combined with clinical observations, patient history, and epidemiological information. The expected result is Negative.  Fact Sheet for Patients:  BloggerCourse.com  Fact Sheet for Healthcare Providers:  SeriousBroker.it  This test is no t yet approved or cleared by the [place]  FDA and  has been authorized for detection and/or diagnosis of SARS-CoV-2 by FDA under an Emergency Use Authorization (EUA). This EUA will remain  in effect (meaning this test can be used) for the duration of the COVID-19 declaration under Section 564(b)(1) of the Act, 21 U.S.C.section 360bbb-3(b)(1), unless the authorization is  terminated  or revoked sooner.       Influenza A by PCR NEGATIVE NEGATIVE Final   Influenza B by PCR NEGATIVE NEGATIVE Final    Comment: (NOTE) The Xpert Xpress SARS-CoV-2/FLU/RSV plus assay is intended as an aid in the diagnosis of influenza from Nasopharyngeal swab specimens and should not be used as a sole basis for treatment. Nasal washings and aspirates are unacceptable for Xpert Xpress SARS-CoV-2/FLU/RSV testing.  Fact Sheet for Patients: BloggerCourse.com  Fact Sheet for Healthcare Providers: SeriousBroker.it  This test is not yet approved or cleared by the [place]  FDA and has been authorized for detection and/or diagnosis of SARS-CoV-2 by FDA under an Emergency Use Authorization (EUA). This EUA will remain in effect (meaning this test can be used) for the duration of the COVID-19 declaration under Section 564(b)(1) of the Act, 21 U.S.C. section 360bbb-3(b)(1), unless the authorization is terminated or revoked.     Resp Syncytial Virus by PCR NEGATIVE NEGATIVE Final    Comment: (NOTE) Fact Sheet for Patients: BloggerCourse.com  Fact Sheet for Healthcare Providers: SeriousBroker.it  This test is not yet approved or cleared by the [place]  FDA and has been authorized for detection and/or diagnosis of SARS-CoV-2 by FDA under an Emergency Use Authorization (EUA). This EUA will remain in effect (meaning this test can be used) for the duration of the COVID-19 declaration under Section 564(b)(1) of the Act, 21 U.S.C. section 360bbb-3(b)(1), unless the authorization is terminated  or revoked.  Performed at [facility], [address], [place], [place] [address]   MRSA Next Gen by PCR, Nasal     Status: None   Collection Time: 08/08/23  8:52 PM   Specimen: Nasal Mucosa; Nasal Swab  Result Value Ref Range Status   MRSA by PCR Next Gen NOT  DETECTED NOT DETECTED Final    Comment: (NOTE) The GeneXpert MRSA Assay (FDA approved for NASAL specimens only), is one component of a comprehensive MRSA colonization surveillance program. It is not intended to diagnose MRSA infection nor to guide or monitor treatment for MRSA infections. Test performance is not FDA approved in patients less than 60 years old. Performed at [facility], [address], [place], [place] [address]   Body fluid culture w Gram Stain     Status: None (Preliminary result)   Collection Time: 08/10/23 12:18 PM   Specimen: Peritoneal Cavity; Peritoneal Fluid  Result Value Ref Range Status   Specimen Description   Final    PERITONEAL CAVITY Performed at [facility], [address], [place], [place] [address]    Special Requests   Final    NONE Performed at [facility], [address] [place], [place] [address]    Gram Stain   Final    WBC PRESENT, PREDOMINANTLY PMN NO ORGANISMS SEEN CYTOSPIN SMEAR    Culture   Final    NO GROWTH 3 DAYS Performed at [facility], [address], [place], [place] [address]    Report Status PENDING  Incomplete  MRSA Next Gen by PCR, Nasal     Status: None   Collection Time: 08/14/23  5:10 AM   Specimen: Nasal Mucosa; Nasal Swab  Result Value Ref Range Status   MRSA by PCR Next Gen NOT DETECTED NOT DETECTED Final    Comment: (NOTE) The GeneXpert MRSA Assay (FDA approved for NASAL specimens only), is one component of a comprehensive MRSA colonization surveillance program. It is not intended to diagnose MRSA infection nor to guide or monitor treatment for MRSA infections. Test performance is not FDA approved in patients less than 6 years old. Performed at [facility], [address] [place], [place] [address]     Radiology Studies: No results found.  Scheduled Meds:  sodium chloride    Intravenous Once   Chlorhexidine  Gluconate  Cloth  6 each Topical Daily   feeding supplement  237 mL Oral BID BM   influenza vaccine adjuvanted  0.5 mL Intramuscular Tomorrow-1000   insulin  aspart  0-6 Units Subcutaneous TID WC   lactulose   30 g Oral TID   lipase/protease/amylase  36,000 Units Oral TID AC   midodrine   10 mg Oral TID WC   multivitamin with minerals  1 tablet Oral Daily   octreotide  100 mcg Intravenous Once   pantoprazole  (PROTONIX ) IV  40 mg Intravenous Q12H   pneumococcal 20-valent conjugate vaccine  0.5 mL Intramuscular Tomorrow-1000   prednisoLONE   40 mg Oral Daily   Continuous Infusions:  sodium chloride      norepinephrine  (LEVOPHED ) Adult infusion 3 mcg/min (08/14/23 0624)   octreotide (SANDOSTATIN) 500 mcg in sodium chloride  0.9 % 250 mL (2 mcg/mL) infusion     piperacillin -tazobactam (ZOSYN )  IV 12.5 mL/hr at 08/14/23 5784   potassium PHOSPHATE IVPB (in mmol)       LOS: 6 days    Time spent: 75 mins     Magdalene School, MD [facility]  If 7PM-7AM, please contact night-coverage

## 2023-08-14 NOTE — Anesthesia Procedure Notes (Signed)
 Procedure Name: MAC Date/Time: 08/14/2023 3:23 PM  Performed by: Rochell Chroman, CRNAPre-anesthesia Checklist: Patient identified, Emergency Drugs available, Suction available and Patient being monitored Patient Re-evaluated:Patient Re-evaluated prior to induction Oxygen Delivery Method: Simple face mask

## 2023-08-14 NOTE — Anesthesia Preprocedure Evaluation (Addendum)
 Anesthesia Evaluation  Patient identified by MRN, date of birth, ID band Patient awake    Reviewed: Allergy & Precautions, NPO status , Patient's Chart, lab work & pertinent test results, reviewed documented beta blocker date and time   History of Anesthesia Complications Negative for: history of anesthetic complications  Airway Mallampati: II       Dental  (+) Poor Dentition   Pulmonary neg shortness of breath, neg COPD, Current Smoker and Patient abstained from smoking.   breath sounds clear to auscultation       Cardiovascular (-) angina (-) CAD and (-) Past MI  Rhythm:Regular Rate:Normal  08/09/23 IMPRESSIONS     1. Left ventricular ejection fraction, by estimation, is >75%. The left  ventricle has hyperdynamic function. The left ventricle has no regional  wall motion abnormalities. Left ventricular diastolic parameters were  normal.   2. Right ventricular systolic function is normal. The right ventricular  size is grossly normal.   3. Right atrial size was grossly normal.   4. The mitral valve is grossly normal. Mild mitral valve regurgitation.  No evidence of mitral stenosis.   5. The aortic valve was not well visualized. Aortic valve regurgitation  is not visualized. No aortic stenosis is present.   6. The inferior vena cava is normal in size with <50% respiratory  variability, suggesting right atrial pressure of 8 mmHg.     Neuro/Psych neg Seizures    GI/Hepatic ,,,(+) Cirrhosis       Admitted with acute liver failure; encephalopathy, coagulopathy. Required intubation initially but now extubated.   Endo/Other  diabetes    Renal/GU      Musculoskeletal   Abdominal   Peds  Hematology  (+) Blood dyscrasia, anemia Hgb 6.7 this AM, now s/p 2u PRBC   Anesthesia Other Findings   Reproductive/Obstetrics                              Anesthesia Physical Anesthesia Plan  ASA:  4  Anesthesia Plan: MAC   Post-op Pain Management:    Induction: Intravenous  PONV Risk Score and Plan: 1 and Ondansetron  Airway Management Planned: Natural Airway and Simple Face Mask  Additional Equipment:   Intra-op Plan:   Post-operative Plan:   Informed Consent: I have reviewed the patients History and Physical, chart, labs and discussed the procedure including the risks, benefits and alternatives for the proposed anesthesia with the patient or authorized representative who has indicated his/her understanding and acceptance.       Plan Discussed with: CRNA  Anesthesia Plan Comments:          Anesthesia Quick Evaluation

## 2023-08-14 NOTE — Consult Note (Signed)
 NAME:  RILEIGH DUDENHOEFFER, MRN:  [id number], DOB:  03-Aug-1950, LOS: 6 ADMISSION DATE:  08/08/2023, CONSULTATION DATE: 4/27 REFERRING MD: Dr. Elsworth Halt, CHIEF COMPLAINT: Shock  History of Present Illness:  73 year old man with history of alcohol use, clear PMH but presumed underlying cirrhosis, admitted from group home 4/21 with acute encephalopathy, weakness, shock.  Found to have fulminant acute hepatic failure with coagulopathy, renal failure, profound anemia.  Required intubation mechanical ventilation, treated with n-acetylcysteine  (Tylenol  level negative), prednisolone  possible alcoholic hepatitis, Zosyn  for possible infectious cause including biliary obstruction, lactulose .  Volume resuscitation and PRBC resuscitation initiated. Diagnostic paracentesis done 4/23 > negative.  Extubated on 4/23, started midodrine  4/24 and pressors weaned to off.  Has continued to have dark stools.  Back to stepdown unit on 4/27 due to evolving hypotension, persistent anemia with maroon stool and clots, hemoglobin 8.8 > 8.0 > 6.4 on 4/27.  Started on norepinephrine  and uptitrating, currently at 10.  Just received 1 unit PRBC and 2 more are ordered.  PCCM consulted given his recurrent shock.   Pertinent  Medical History  Unclear PMH, history of cirrhosis, question due to alcohol use  Significant Hospital Events: Including procedures, antibiotic start and stop dates in addition to other pertinent events   4/27 back to the ICU with hematochezia and shock  Interim History / Subjective:  Currently on NE 10 Second unit of blood just started   Objective   Blood pressure (!) 80/51, pulse (!) 126, temperature 98.1 F (36.7 C), temperature source Oral, resp. rate 19, height 5\' 9"  (1.753 m), weight 55.3 kg, SpO2 99%.        Intake/Output Summary (Last 24 hours) at 08/14/2023 1028 Last data filed at 08/14/2023 0757 Gross per 24 hour  Intake 1882.7 ml  Output --  Net 1882.7 ml   Filed Weights   08/11/23 0500  08/12/23 0100 08/12/23 0238  Weight: 53.1 kg 55 kg 55.3 kg    Examination: General: Ill-appearing man, laying in bed in no distress HENT: Scleral icterus, oropharynx moist without bleeding Lungs: Clear bilaterally, no wheeze or crackles Cardiovascular: Regular, no murmur Abdomen: Somewhat distended but soft, not taut.  No tenderness to palpation Extremities: No significant edema Neuro: Awake, alert, interacting appropriately, follows commands.  Resolved Hospital Problem list   Acute renal failure Acute respiratory failure requiring mechanical ventilation  Assessment & Plan:   Shock, presumed hemorrhagic shock.  Has been treated since presentation for possible component sepsis.  -Aggressive PRBC and volume resuscitation, follow serial CBC -Norepinephrine  as ordered, wean as we are able to stabilize.  Will check to see if PICC line placement is possible if not then he may need CVC despite his coagulopathy -Remains on Zosyn  since presentation for possible sepsis contribution to his shock, no clear source but considered biliary obstruction, ultrasound reassuring.  Cultures negative - Echocardiogram 4/22 with intact LV function - Continue midodrine   Acute GI bleeding, high risk esophageal varices.  Consider PUD - Appreciate GI evaluation.  Octreotide was started 4/27 -Continue pantoprazole  twice daily - Considering timing for EGD depending on his overall stability  Acute on chronic hepatic failure, suspect alcoholic hepatitis superimposed on cirrhosis - Remains on prednisolone  - Completed n-acetylcysteine , Tylenol  was negative  Hepatic encephalopathy - Lactulose  ordered  Hepatic coagulopathy -Vitamin K as ordered  Thrombocytopenia due to cirrhosis, alcohol use - Following CBC - In setting of active bleeding platelet goal > 50k  Pancreatitis, suspect alcohol induced - No reported evidence of obstruction in the biliary system and  lipase clearing -Creon  ordered  Severe  protein calorie malnutrition -Receiving supplements  Best Practice (right click and "Reselect all SmartList Selections" daily)   Diet/type: NPO DVT prophylaxis SCD Pressure ulcer(s): N/A GI prophylaxis: PPI Lines: N/A Foley:  N/A Code Status:  full code Last date of multidisciplinary goals of care discussion [pending]  Labs   CBC: Recent Labs  Lab 08/08/23 1336 08/08/23 1610 08/09/23 0251 08/10/23 0537 08/10/23 1556 08/10/23 2156 08/11/23 0534 08/12/23 0423 08/13/23 0755 08/14/23 0055  WBC 16.2* 13.7*   < > 19.9*  --   --  19.9* 21.5* 18.2* 18.6*  NEUTROABS 11.9* 10.7*  --   --   --   --   --   --   --   --   HGB 3.3* 3.4*   < > 6.8*   < > 8.4* 8.3* 8.8* 8.0* 6.4*  HCT 9.7* 9.7*   < > 19.2*   < > 24.4* 23.7* 26.0* 24.3* 19.2*  MCV 109.0* 107.8*   < > 92.8  --   --  90.1 92.5 94.2 92.8  PLT 211 136*   < > 88*  --   --  85* 72* 70* 77*   < > = values in this interval not displayed.    Basic Metabolic Panel: Recent Labs  Lab 08/09/23 0252 08/09/23 2216 08/10/23 0537 08/11/23 0534 08/12/23 0423 08/13/23 0755 08/14/23 0055  NA  --    < > 136 135 137 144 145  K  --    < > 3.7 3.5 3.2* 3.4* 3.7  CL  --    < > 104 105 105 113* 116*  CO2  --    < > 20* 19* 20* 21* 19*  GLUCOSE  --    < > 136* 140* 159* 154* 145*  BUN  --    < > 62* 50* 41* 36* 40*  CREATININE  --    < > 0.91 0.84 0.78 0.58* 0.91  CALCIUM  --    < > 8.0* 7.9* 8.1* 8.8* 8.7*  MG 1.7  --   --   --   --   --  1.9  PHOS 2.8  --   --   --   --   --  1.9*   < > = values in this interval not displayed.   GFR: Estimated Creatinine Clearance: 58.2 mL/min (by C-G formula based on SCr of 0.91 mg/dL). Recent Labs  Lab 08/08/23 1417 08/08/23 1610 08/08/23 1624 08/09/23 0251 08/11/23 0534 08/12/23 0423 08/13/23 0755 08/14/23 0055  WBC  --    < >  --    < > 19.9* 21.5* 18.2* 18.6*  LATICACIDVEN 7.2*  --  6.7*  --   --   --   --   --    < > = values in this interval not displayed.    Liver Function  Tests: Recent Labs  Lab 08/10/23 0537 08/11/23 0534 08/12/23 0423 08/13/23 0755 08/14/23 0055  AST 154* 142* 129* 144* 139*  ALT 47* 53* 63* 78* 79*  ALKPHOS 48 52 57 61 55  BILITOT 16.9* 17.1* 17.4* 18.7* 16.8*  PROT 6.3* 6.3* 6.4* 6.3* 5.5*  ALBUMIN  2.6* 2.5* 2.3* 2.1* 1.8*   Recent Labs  Lab 08/10/23 0537 08/12/23 0423  LIPASE 679* 354*   Recent Labs  Lab 08/08/23 1420  AMMONIA 122*    ABG    Component Value Date/Time   PHART 7.5 (H) 08/08/2023 2100   PCO2ART 25 (  L) 08/08/2023 2100   PO2ART 305 (H) 08/08/2023 2100   HCO3 19.5 (L) 08/08/2023 2100   TCO2 31 03/20/2007 1527   ACIDBASEDEF 2.1 (H) 08/08/2023 2100   O2SAT 99.9 08/08/2023 2100     Coagulation Profile: Recent Labs  Lab 08/08/23 1336 08/09/23 0252 08/10/23 0537 08/11/23 0534 08/14/23 0055  INR 3.0* 2.6* 2.8* 2.2* 2.2*    Cardiac Enzymes: Recent Labs  Lab 08/08/23 1610  CKTOTAL 14*    HbA1C: Hgb A1c MFr Bld  Date/Time Value Ref Range Status  08/10/2023 09:56 PM 4.7 (L) 4.8 - 5.6 % Final    Comment:    (NOTE) Pre diabetes:          5.7%-6.4%  Diabetes:              >6.4%  Glycemic control for   <7.0% adults with diabetes   08/08/2023 04:10 PM QNSTST 4.8 - 5.6 % Final    Comment:    (NOTE) Test not performed. Insufficient specimen to perform or complete analysis. Montie Apley notified 08/10/2023-[place]         Prediabetes: 5.7 - 6.4         Diabetes: >6.4         Glycemic control for adults with diabetes: <7.0     CBG: Recent Labs  Lab 08/13/23 0724 08/13/23 1118 08/13/23 1639 08/14/23 0426 08/14/23 0751  GLUCAP 128* 160* 165* 137* 82    Review of Systems:   Feels more bloated but denies any pain  Past Medical History:  He,  has no past medical history on file.   Surgical History:  History reviewed. No pertinent surgical history.   Social History:   reports that he has been smoking. He has never used smokeless tobacco.   Family History:  His family  history is not on file.   Allergies No Known Allergies   Home Medications  Prior to Admission medications   Medication Sig Start Date End Date Taking? Authorizing Provider  ascorbic acid (VITAMIN C) 500 MG tablet Take 500-1,000 mg by mouth daily.   Yes [provider]  aspirin EC 325 MG tablet Take 325 mg by mouth every 8 (eight) hours as needed (for headaches).   Yes [provider]  cyanocobalamin (VITAMIN B12) 1000 MCG tablet Take 1,000 mcg by mouth daily.   Yes [provider]     Critical care time: 40 min     Racheal Buddle, MD, PhD 08/14/2023, 10:28 AM [facility] [phone] or if no answer before 7:00PM call [phone] For any issues after 7:00PM please call eLink [phone]

## 2023-08-14 NOTE — Progress Notes (Signed)
 Peripherally Inserted Central Catheter Placement  The IV Nurse has discussed with the patient and/or persons authorized to consent for the patient, the purpose of this procedure and the potential benefits and risks involved with this procedure.  The benefits include less needle sticks, lab draws from the catheter, and the patient may be discharged home with the catheter. Risks include, but not limited to, infection, bleeding, blood clot (thrombus formation), and puncture of an artery; nerve damage and irregular heartbeat and possibility to perform a PICC exchange if needed/ordered by physician.  Alternatives to this procedure were also discussed.  Bard Power PICC patient education guide, fact sheet on infection prevention and patient information card has been provided to patient /or left at bedside. Obtained telephone consent from brother.   PICC Placement Documentation  PICC Triple Lumen 08/14/23 Right Basilic 37 cm 0 cm (Active)  Indication for Insertion or Continuance of Line Vasoactive infusions;Limited venous access - need for IV therapy >5 days (PICC only) 08/14/23 1250  Exposed Catheter (cm) 0 cm 08/14/23 1250  Site Assessment Clean, Dry, Intact 08/14/23 1250  Lumen #1 Status Flushed;Saline locked;Blood return noted 08/14/23 1250  Lumen #2 Status Flushed;Saline locked;Blood return noted 08/14/23 1250  Lumen #3 Status Flushed;Saline locked;Blood return noted 08/14/23 1250  Dressing Type Transparent;Securing device 08/14/23 1250  Dressing Status Antimicrobial disc/dressing in place;Clean, Dry, Intact 08/14/23 1250  Line Care Connections checked and tightened 08/14/23 1250  Line Adjustment (NICU/IV Team Only) No 08/14/23 1250  Dressing Intervention New dressing 08/14/23 1250  Dressing Change Due 08/21/23 08/14/23 1250       Ignatius Kloos Haywood Lisle 08/14/2023, 12:51 PM

## 2023-08-15 ENCOUNTER — Inpatient Hospital Stay (HOSPITAL_COMMUNITY)

## 2023-08-15 DIAGNOSIS — K922 Gastrointestinal hemorrhage, unspecified: Secondary | ICD-10-CM | POA: Diagnosis not present

## 2023-08-15 DIAGNOSIS — K7200 Acute and subacute hepatic failure without coma: Secondary | ICD-10-CM | POA: Diagnosis not present

## 2023-08-15 DIAGNOSIS — R571 Hypovolemic shock: Secondary | ICD-10-CM | POA: Diagnosis not present

## 2023-08-15 DIAGNOSIS — D62 Acute posthemorrhagic anemia: Secondary | ICD-10-CM

## 2023-08-15 LAB — COMPREHENSIVE METABOLIC PANEL WITH GFR
ALT: 128 U/L — ABNORMAL HIGH (ref 0–44)
AST: 309 U/L — ABNORMAL HIGH (ref 15–41)
Albumin: 2.2 g/dL — ABNORMAL LOW (ref 3.5–5.0)
Alkaline Phosphatase: 31 U/L — ABNORMAL LOW (ref 38–126)
Anion gap: 20 — ABNORMAL HIGH (ref 5–15)
BUN: 42 mg/dL — ABNORMAL HIGH (ref 8–23)
CO2: 14 mmol/L — ABNORMAL LOW (ref 22–32)
Calcium: 7.2 mg/dL — ABNORMAL LOW (ref 8.9–10.3)
Chloride: 105 mmol/L (ref 98–111)
Creatinine, Ser: 1.45 mg/dL — ABNORMAL HIGH (ref 0.61–1.24)
GFR, Estimated: 52 mL/min — ABNORMAL LOW (ref >60)
Glucose, Bld: 235 mg/dL — ABNORMAL HIGH (ref 70–99)
Potassium: 3.3 mmol/L — ABNORMAL LOW (ref 3.5–5.1)
Sodium: 139 mmol/L (ref 135–145)
Total Bilirubin: 15.5 mg/dL — ABNORMAL HIGH (ref 0.0–1.2)
Total Protein: 4.3 g/dL — ABNORMAL LOW (ref 6.5–8.1)

## 2023-08-15 LAB — CBC
HCT: 23.5 % — ABNORMAL LOW (ref 39.0–52.0)
Hemoglobin: 8.1 g/dL — ABNORMAL LOW (ref 13.0–17.0)
MCH: 29.1 pg (ref 26.0–34.0)
MCHC: 34.5 g/dL (ref 30.0–36.0)
MCV: 84.5 fL (ref 80.0–100.0)
Platelets: 98 10*3/uL — ABNORMAL LOW (ref 150–400)
RBC: 2.78 MIL/uL — ABNORMAL LOW (ref 4.22–5.81)
RDW: 17.5 % — ABNORMAL HIGH (ref 11.5–15.5)
WBC: 15.8 10*3/uL — ABNORMAL HIGH (ref 4.0–10.5)
nRBC: 0.9 % — ABNORMAL HIGH (ref 0.0–0.2)

## 2023-08-15 LAB — GLUCOSE, CAPILLARY
Glucose-Capillary: 122 mg/dL — ABNORMAL HIGH (ref 70–99)
Glucose-Capillary: 114 mg/dL — ABNORMAL HIGH (ref 70–99)
Glucose-Capillary: 113 mg/dL — ABNORMAL HIGH (ref 70–99)

## 2023-08-15 LAB — FIBRINOGEN: Fibrinogen: 132 mg/dL — ABNORMAL LOW (ref 210–475)

## 2023-08-15 LAB — PHOSPHORUS: Phosphorus: 9.8 mg/dL — ABNORMAL HIGH (ref 2.5–4.6)

## 2023-08-15 LAB — HEMOGLOBIN AND HEMATOCRIT, BLOOD
HCT: 22.4 % — ABNORMAL LOW (ref 39.0–52.0)
HCT: 25 % — ABNORMAL LOW (ref 39.0–52.0)
HCT: 23.2 % — ABNORMAL LOW (ref 39.0–52.0)
HCT: 23.5 % — ABNORMAL LOW (ref 39.0–52.0)
Hemoglobin: 7.3 g/dL — ABNORMAL LOW (ref 13.0–17.0)
Hemoglobin: 8.4 g/dL — ABNORMAL LOW (ref 13.0–17.0)
Hemoglobin: 8.1 g/dL — ABNORMAL LOW (ref 13.0–17.0)
Hemoglobin: 8.1 g/dL — ABNORMAL LOW (ref 13.0–17.0)

## 2023-08-15 LAB — PROTIME-INR
INR: 2.4 — ABNORMAL HIGH (ref 0.8–1.2)
Prothrombin Time: 26.4 s — ABNORMAL HIGH (ref 11.4–15.2)

## 2023-08-15 LAB — PREPARE RBC (CROSSMATCH)

## 2023-08-15 LAB — AMMONIA: Ammonia: 63 umol/L — ABNORMAL HIGH (ref 9–35)

## 2023-08-15 LAB — LIPASE, BLOOD: Lipase: 117 U/L — ABNORMAL HIGH (ref 11–51)

## 2023-08-15 LAB — MAGNESIUM: Magnesium: 1.6 mg/dL — ABNORMAL LOW (ref 1.7–2.4)

## 2023-08-15 MED ORDER — SODIUM CHLORIDE 0.9% IV SOLUTION: Freq: Once | INTRAVENOUS | Status: AC

## 2023-08-15 MED ORDER — VITAMIN K1 10 MG/ML IJ SOLN
10.0000 mg | Freq: Once | INTRAVENOUS | Status: AC
  Administered 2023-08-15: 10 mg via INTRAVENOUS
  Filled 2023-08-15: qty 1

## 2023-08-15 MED ORDER — SUCRALFATE 1 GM/10ML PO SUSP
1.0000 g | Freq: Three times a day (TID) | ORAL | Status: DC
  Administered 2023-08-16 – 2023-08-20 (×15): 1 g via ORAL
  Filled 2023-08-15 (×14): qty 10

## 2023-08-15 MED ORDER — IOHEXOL 350 MG/ML SOLN
100.0000 mL | Freq: Once | INTRAVENOUS | Status: AC | PRN
  Administered 2023-08-15: 100 mL via INTRAVENOUS

## 2023-08-15 MED ORDER — SODIUM CHLORIDE (PF) 0.9 % IJ SOLN
INTRAMUSCULAR | Status: AC
  Filled 2023-08-15: qty 50

## 2023-08-15 MED ORDER — MAGNESIUM SULFATE 2 GM/50ML IV SOLN
2.0000 g | Freq: Once | INTRAVENOUS | Status: AC
  Administered 2023-08-15: 2 g via INTRAVENOUS
  Filled 2023-08-15: qty 50

## 2023-08-15 MED ORDER — POTASSIUM CHLORIDE 10 MEQ/50ML IV SOLN
10.0000 meq | INTRAVENOUS | Status: AC
  Administered 2023-08-15 (×3): 10 meq via INTRAVENOUS
  Filled 2023-08-15 (×3): qty 50

## 2023-08-15 NOTE — TOC Progression Note (Addendum)
 Transition of Care [facility]) - Progression Note    Patient Details  Name: Kevin Velez MRN: [id number] Date of Birth: [birth year]  Transition of Care [facility]) CM/SW Contact  Bari Leys, RN Phone Number: 08/15/2023, 10:00 AM  Clinical Narrative:   Patient in ICU, not medically stable for dc. TOC will continue to follow.  -2:57pm TOC consult for Medication Assistance, pt has Medicare Advantage insurance on file, not eligible for [facility] program. Teams chat sent to attending notifying patient not eligible for [facility] program, inquired if request if for medication copay check? TOC will continue to follow.      Expected Discharge Plan: Skilled Nursing Facility Barriers to Discharge: Continued Medical Work up, Other (must enter comment) (await bed choice)  Expected Discharge Plan and Services In-house Referral: Clinical Social Work     Living arrangements for the past 2 months: Single Family Home                 DME Arranged: N/A DME Agency: NA                   Social Determinants of Health (SDOH) Interventions SDOH Screenings   Food Insecurity: Patient Unable To Answer (08/09/2023)  Housing: Patient Unable To Answer (08/09/2023)  Transportation Needs: Patient Unable To Answer (08/09/2023)  Utilities: Patient Unable To Answer (08/09/2023)  Social Connections: Patient Unable To Answer (08/09/2023)  Tobacco Use: High Risk (08/14/2023)    Readmission Risk Interventions    08/11/2023    2:18 PM  Readmission Risk Prevention Plan  Transportation Screening Complete  HRI or Home Care Consult Complete  Social Work Consult for Recovery Care Planning/Counseling Complete  Palliative Care Screening Not Applicable  Medication Review Oceanographer) Complete

## 2023-08-15 NOTE — Progress Notes (Addendum)
 Subjective: Last bowel movement was at 5:45 AM today morning, described as red and small in amount. Prior to that, large red bowel movement noted at midnight yesterday.  Objective: Vital signs in last 24 hours: Temp:  [96 F (35.6 C)-98.5 F (36.9 C)] 97.7 F (36.5 C) (04/28 0943) Pulse Rate:  [83-120] 93 (04/28 0943) Resp:  [11-29] 22 (04/28 0943) BP: (75-182)/(43-113) 123/43 (04/28 0943) SpO2:  [96 %-100 %] 96 % (04/28 0943) Weight change:  Last BM Date : 08/15/23  PE: Ill-appearing, icteric GENERAL: Prominent pallor, awake, oriented x 3  ABDOMEN: Not distended, nontender EXTREMITIES: No deformity Lab Results: Results for orders placed or performed during the hospital encounter of 08/08/23 (from the past 48 hours)  Glucose, capillary     Status: Abnormal   Collection Time: 08/13/23 11:18 AM  Result Value Ref Range   Glucose-Capillary 160 (H) 70 - 99 mg/dL    Comment: Glucose reference range applies only to samples taken after fasting for at least 8 hours.  Glucose, capillary     Status: Abnormal   Collection Time: 08/13/23  4:39 PM  Result Value Ref Range   Glucose-Capillary 165 (H) 70 - 99 mg/dL    Comment: Glucose reference range applies only to samples taken after fasting for at least 8 hours.  Comprehensive metabolic panel with GFR     Status: Abnormal   Collection Time: 08/14/23 12:55 AM  Result Value Ref Range   Sodium 145 135 - 145 mmol/L   Potassium 3.7 3.5 - 5.1 mmol/L   Chloride 116 (H) 98 - 111 mmol/L   CO2 19 (L) 22 - 32 mmol/L   Glucose, Bld 145 (H) 70 - 99 mg/dL    Comment: Glucose reference range applies only to samples taken after fasting for at least 8 hours.   BUN 40 (H) 8 - 23 mg/dL   Creatinine, Ser 3.66 0.61 - 1.24 mg/dL   Calcium 8.7 (L) 8.9 - 10.3 mg/dL   Total Protein 5.5 (L) 6.5 - 8.1 g/dL   Albumin  1.8 (L) 3.5 - 5.0 g/dL   AST 440 (H) 15 - 41 U/L   ALT 79 (H) 0 - 44 U/L   Alkaline Phosphatase 55 38 - 126 U/L   Total Bilirubin 16.8 (H) 0.0  - 1.2 mg/dL   GFR, Estimated >34 >74 mL/min    Comment: (NOTE) Calculated using the CKD-EPI Creatinine Equation (2021)    Anion gap 10 5 - 15    Comment: Performed at [facility], [address] [place], [place] [address]  CBC     Status: Abnormal   Collection Time: 08/14/23 12:55 AM  Result Value Ref Range   WBC 18.6 (H) 4.0 - 10.5 K/uL   RBC 2.07 (L) 4.22 - 5.81 MIL/uL   Hemoglobin 6.4 (LL) 13.0 - 17.0 g/dL    Comment: REPEATED TO VERIFY THIS CRITICAL RESULT HAS VERIFIED AND BEEN CALLED TO NIKKI DAVENPORT, RN BY MEGAN HAYES ON 04 27 2025 AT 0117, AND HAS BEEN READ BACK. CRITICAL RESULT VERIFIED    HCT 19.2 (L) 39.0 - 52.0 %   MCV 92.8 80.0 - 100.0 fL   MCH 30.9 26.0 - 34.0 pg   MCHC 33.3 30.0 - 36.0 g/dL   RDW 38.7 (H) 56.4 - 33.2 %   Platelets 77 (L) 150 - 400 K/uL    Comment: Immature Platelet Fraction may be clinically indicated, consider ordering this additional test [id number] CONSISTENT WITH PREVIOUS RESULT REPEATED TO VERIFY  nRBC 1.5 (H) 0.0 - 0.2 %    Comment: Performed at [facility], [address], [place], [place] [address]  Magnesium      Status: None   Collection Time: 08/14/23 12:55 AM  Result Value Ref Range   Magnesium  1.9 1.7 - 2.4 mg/dL    Comment: Performed at [facility], [address], [place], [place] [address]  Phosphorus     Status: Abnormal   Collection Time: 08/14/23 12:55 AM  Result Value Ref Range   Phosphorus 1.9 (L) 2.5 - 4.6 mg/dL    Comment: ICTERUS AT THIS LEVEL MAY AFFECT RESULT Performed at [facility], [address], [place], [place] [address]   Protime-INR     Status: Abnormal   Collection Time: 08/14/23 12:55 AM  Result Value Ref Range   Prothrombin Time 24.8 (H) 11.4 - 15.2 seconds   INR 2.2 (H) 0.8 - 1.2    Comment: (NOTE) INR goal varies based on device and disease states. Performed at [facility], [address], [place], [place] [address]   Prepare RBC (crossmatch)     Status: None   Collection Time: 08/14/23  3:40 AM  Result Value Ref Range   Order Confirmation      ORDER PROCESSED BY BLOOD BANK Performed at [facility], [address], [place], [place] [address]   Type and screen [facility]     Status: None (Preliminary result)   Collection Time: 08/14/23  3:40 AM  Result Value Ref Range   ABO/RH(D) O POS    Antibody Screen NEG    Sample Expiration 08/17/2023,2359    Unit Number [id number]    Blood Component Type RED CELLS,LR    Unit division 00    Status of Unit ISSUED    Transfusion Status OK TO TRANSFUSE    Crossmatch Result Compatible    Unit Number [id number]    Blood Component Type RED CELLS,LR    Unit division 00    Status of Unit ISSUED    Transfusion Status OK TO TRANSFUSE    Crossmatch Result Compatible    Unit Number [id number]    Blood Component Type RED CELLS,LR    Unit division 00    Status of Unit ISSUED    Transfusion Status OK TO TRANSFUSE    Crossmatch Result Compatible    Unit Number [id number]    Blood Component Type RED CELLS,LR    Unit division 00    Status of Unit DISCARDED    Transfusion Status OK TO TRANSFUSE    Crossmatch Result      Compatible Performed at [facility], [address] [place], [place] [address]    Unit Number [id number]    Blood Component Type RED CELLS,LR    Unit division 00    Status of Unit ISSUED    Transfusion Status OK TO TRANSFUSE    Crossmatch Result Compatible    Unit Number [id number]    Blood Component Type RED CELLS,LR    Unit division 00    Status of Unit ISSUED    Transfusion Status OK TO TRANSFUSE    Crossmatch Result Compatible    Unit Number [id number]    Blood Component Type RED CELLS,LR    Unit division 00    Status of Unit ALLOCATED    Transfusion Status OK TO TRANSFUSE    Crossmatch Result Compatible   Glucose,  capillary  Status: Abnormal   Collection Time: 08/14/23  4:26 AM  Result Value Ref Range   Glucose-Capillary 137 (H) 70 - 99 mg/dL    Comment: Glucose reference range applies only to samples taken after fasting for at least 8 hours.  MRSA Next Gen by PCR, Nasal     Status: None   Collection Time: 08/14/23  5:10 AM   Specimen: Nasal Mucosa; Nasal Swab  Result Value Ref Range   MRSA by PCR Next Gen NOT DETECTED NOT DETECTED    Comment: (NOTE) The GeneXpert MRSA Assay (FDA approved for NASAL specimens only), is one component of a comprehensive MRSA colonization surveillance program. It is not intended to diagnose MRSA infection nor to guide or monitor treatment for MRSA infections. Test performance is not FDA approved in patients less than 20 years old. Performed at [facility], [address] [place], [place] [address]   Glucose, capillary     Status: None   Collection Time: 08/14/23  7:51 AM  Result Value Ref Range   Glucose-Capillary 82 70 - 99 mg/dL    Comment: Glucose reference range applies only to samples taken after fasting for at least 8 hours.   Comment 1 Notify RN    Comment 2 Document in Chart   Prepare RBC (crossmatch)     Status: None   Collection Time: 08/14/23  9:45 AM  Result Value Ref Range   Order Confirmation      ORDER PROCESSED BY BLOOD BANK Performed at [facility], [address] [place], [place] [address]   Glucose, capillary     Status: None   Collection Time: 08/14/23 11:53 AM  Result Value Ref Range   Glucose-Capillary 79 70 - 99 mg/dL    Comment: Glucose reference range applies only to samples taken after fasting for at least 8 hours.  Hemoglobin and hematocrit, blood     Status: Abnormal   Collection Time: 08/14/23  2:55 PM  Result Value Ref Range   Hemoglobin 8.6 (L) 13.0 - 17.0 g/dL    Comment: REPEATED TO VERIFY POST TRANSFUSION SPECIMEN    HCT 25.8 (L) 39.0 - 52.0 %    Comment: Performed at  [facility], [address] [place], [place] [address]  Glucose, capillary     Status: Abnormal   Collection Time: 08/14/23  4:23 PM  Result Value Ref Range   Glucose-Capillary 197 (H) 70 - 99 mg/dL    Comment: Glucose reference range applies only to samples taken after fasting for at least 8 hours.   Comment 1 Notify RN    Comment 2 Document in Chart   Hemoglobin and hematocrit, blood     Status: Abnormal   Collection Time: 08/14/23  5:50 PM  Result Value Ref Range   Hemoglobin 5.6 (LL) 13.0 - 17.0 g/dL    Comment: REPEATED TO VERIFY THIS CRITICAL RESULT HAS VERIFIED AND BEEN CALLED TO B.MAY, RN BY ATCHISON,MARY ON 04 27 2025 AT 1808, AND HAS BEEN READ BACK.     HCT 16.3 (L) 39.0 - 52.0 %    Comment: Performed at [facility], [address], [place], [place] [address]  Prepare RBC (crossmatch)     Status: None   Collection Time: 08/14/23  6:32 PM  Result Value Ref Range   Order Confirmation      ORDER PROCESSED BY BLOOD BANK Performed at [facility], [address], [place], [place] [address]   Prepare  fresh frozen plasma     Status: None (Preliminary result)   Collection Time: 08/15/23 12:05 AM  Result Value Ref Range   Unit Number [id number]    Blood Component Type THW PLS APHR    Unit division A0    Status of Unit ISSUED    Transfusion Status      OK TO TRANSFUSE Performed at [facility], [address], [place], [place] [address]   Prepare RBC (crossmatch)     Status: None   Collection Time: 08/15/23 12:40 AM  Result Value Ref Range   Order Confirmation      ORDER PROCESSED BY BLOOD BANK Performed at [facility], [address] [place], [place] [address]   Prepare fresh frozen plasma     Status: None (Preliminary result)   Collection Time: 08/15/23 12:57 AM  Result Value Ref Range   Unit Number [id number]    Blood Component Type THW PLS APHR    Unit division  B0    Status of Unit ISSUED    Transfusion Status      OK TO TRANSFUSE Performed at [facility], [address], [place], [place] [address]   Hemoglobin and hematocrit, blood     Status: Abnormal   Collection Time: 08/15/23  2:19 AM  Result Value Ref Range   Hemoglobin 7.3 (L) 13.0 - 17.0 g/dL    Comment: REPEATED TO VERIFY POST TRANSFUSION SPECIMEN DELTA CHECK NOTED    HCT 22.4 (L) 39.0 - 52.0 %    Comment: Performed at [facility], [address] [place], [place] [address]  Prepare platelet pheresis     Status: None (Preliminary result)   Collection Time: 08/15/23  3:25 AM  Result Value Ref Range   Unit Number [id number]    Blood Component Type PLTP3 PSORALEN TREATED    Unit division 00    Status of Unit ISSUED    Transfusion Status      OK TO TRANSFUSE Performed at [facility], [address] [place], [place] [address]   Comprehensive metabolic panel with GFR     Status: Abnormal   Collection Time: 08/15/23  5:19 AM  Result Value Ref Range   Sodium 139 135 - 145 mmol/L   Potassium 3.3 (L) 3.5 - 5.1 mmol/L   Chloride 105 98 - 111 mmol/L   CO2 14 (L) 22 - 32 mmol/L   Glucose, Bld 235 (H) 70 - 99 mg/dL    Comment: Glucose reference range applies only to samples taken after fasting for at least 8 hours.   BUN 42 (H) 8 - 23 mg/dL   Creatinine, Ser 1.76 (H) 0.61 - 1.24 mg/dL   Calcium 7.2 (L) 8.9 - 10.3 mg/dL   Total Protein 4.3 (L) 6.5 - 8.1 g/dL   Albumin  2.2 (L) 3.5 - 5.0 g/dL   AST 160 (H) 15 - 41 U/L   ALT 128 (H) 0 - 44 U/L   Alkaline Phosphatase 31 (L) 38 - 126 U/L   Total Bilirubin 15.5 (H) 0.0 - 1.2 mg/dL   GFR, Estimated 52 (L) >60 mL/min    Comment: (NOTE) Calculated using the CKD-EPI Creatinine Equation (2021)    Anion gap 20 (H) 5 - 15    Comment: Performed at [facility], [address], [place], [place] [address]  CBC     Status: Abnormal   Collection Time: 08/15/23   5:19 AM  Result Value Ref  Range   WBC 15.8 (H) 4.0 - 10.5 K/uL   RBC 2.78 (L) 4.22 - 5.81 MIL/uL   Hemoglobin 8.1 (L) 13.0 - 17.0 g/dL   HCT 16.1 (L) 09.6 - 04.5 %   MCV 84.5 80.0 - 100.0 fL    Comment: REPEATED TO VERIFY DELTA CHECK NOTED    MCH 29.1 26.0 - 34.0 pg   MCHC 34.5 30.0 - 36.0 g/dL   RDW 40.9 (H) 81.1 - 91.4 %   Platelets 98 (L) 150 - 400 K/uL    Comment: Immature Platelet Fraction may be clinically indicated, consider ordering this additional test [id number]    nRBC 0.9 (H) 0.0 - 0.2 %    Comment: Performed at [facility], [address], [place], [place] [address]  Magnesium      Status: Abnormal   Collection Time: 08/15/23  5:19 AM  Result Value Ref Range   Magnesium  1.6 (L) 1.7 - 2.4 mg/dL    Comment: Performed at [facility], [address], [place], [place] [address]  Phosphorus     Status: Abnormal   Collection Time: 08/15/23  5:19 AM  Result Value Ref Range   Phosphorus 9.8 (H) 2.5 - 4.6 mg/dL    Comment: ICTERUS AT THIS LEVEL MAY AFFECT RESULT Performed at [facility], [address] [place], [place] [address]   Ammonia     Status: Abnormal   Collection Time: 08/15/23  5:19 AM  Result Value Ref Range   Ammonia 63 (H) 9 - 35 umol/L    Comment: Performed at [facility], [address], [place], [place] [address]  Lipase, blood     Status: Abnormal   Collection Time: 08/15/23  5:19 AM  Result Value Ref Range   Lipase 117 (H) 11 - 51 U/L    Comment: Performed at [facility], [address], [place], [place] [address]  Protime-INR     Status: Abnormal   Collection Time: 08/15/23  5:19 AM  Result Value Ref Range   Prothrombin Time 26.4 (H) 11.4 - 15.2 seconds   INR 2.4 (H) 0.8 - 1.2    Comment: (NOTE) INR goal varies based on device and disease states. Performed at [facility], [address] [place], [place] [address]   Prepare  fresh frozen plasma     Status: None (Preliminary result)   Collection Time: 08/15/23  7:00 AM  Result Value Ref Range   Unit Number [id number]    Blood Component Type THW PLS APHR    Unit division B0    Status of Unit ISSUED    Transfusion Status      OK TO TRANSFUSE Performed at [facility], [address], [place], [place] [address]    Unit Number [id number]    Blood Component Type THW PLS APHR    Unit division A0    Status of Unit ALLOCATED    Transfusion Status OK TO TRANSFUSE   Glucose, capillary     Status: Abnormal   Collection Time: 08/15/23  7:46 AM  Result Value Ref Range   Glucose-Capillary 122 (H) 70 - 99 mg/dL    Comment: Glucose reference range applies only to samples taken after fasting for at least 8 hours.   Comment 1 Notify RN    Comment 2 Document in Chart     Studies/Results: CT Angio Abd/Pel w/ and/or w/o Result Date: 08/15/2023 CLINICAL DATA:  Lower gastrointestinal hemorrhage EXAM:  CT ANGIOGRAPHY ABDOMEN AND PELVIS WITH CONTRAST AND WITHOUT CONTRAST TECHNIQUE: Multidetector CT imaging of the abdomen and pelvis was performed using the standard protocol during bolus administration of intravenous contrast. Multiplanar reconstructed images and MIPs were obtained and reviewed to evaluate the vascular anatomy. RADIATION DOSE REDUCTION: This exam was performed according to the departmental dose-optimization program which includes automated exposure control, adjustment of the mA and/or kV according to patient size and/or use of iterative reconstruction technique. CONTRAST:  OMNIPAQUE  IOHEXOL  350 MG/ML SOLN COMPARISON:  08/10/2023 FINDINGS: VASCULAR Aorta: Normal caliber aorta without aneurysm, dissection, vasculitis or significant stenosis. Mild atherosclerotic calcification Celiac: Patent without evidence of aneurysm, dissection, vasculitis or significant stenosis. SMA: Patent without evidence of aneurysm, dissection, vasculitis or  significant stenosis. Renals: Single renal arteries are seen bilaterally demonstrating wide patency. There is a beaded appearance involving the mid segment of the renal arteries bilaterally in keeping with changes of fibromuscular dysplasia. No dissection or aneurysm. IMA: Patent without evidence of aneurysm, dissection, vasculitis or significant stenosis. Inflow: Patent without evidence of aneurysm, dissection, vasculitis or significant stenosis. Proximal Outflow: Bilateral common femoral and visualized portions of the superficial and profunda femoral arteries are patent without evidence of aneurysm, dissection, vasculitis or significant stenosis. Veins: No obvious venous abnormality within the limitations of this arterial phase study. Review of the MIP images confirms the above findings. NON-VASCULAR Lower chest: Trace right and small left pleural effusions with associated bibasilar atelectasis. Mild emphysema. No acute abnormality. Hepatobiliary: Mildly nodular liver contour suggests changes of underlying cirrhosis. Superimposed mild hepatic steatosis. No enhancing intrahepatic mass. No intra or extrahepatic biliary ductal dilation. Cholelithiasis noted without superimposed pericholecystic inflammatory change. Pancreas: Unremarkable Spleen: Unremarkable Adrenals/Urinary Tract: Adrenal glands are unremarkable. Simple cortical cysts are seen within the kidneys bilaterally for which no follow-up imaging is recommended. The kidneys are otherwise unremarkable. Bladder unremarkable. Stomach/Bowel: Stable mild ascites. No active gastrointestinal hemorrhage. Moderate ascending and descending colonic diverticulosis. Stomach, small bowel, and large are otherwise unremarkable. Appendix normal. No free intraperitoneal gas. Lymphatic: No pathologic adenopathy within the abdomen and pelvis. Reproductive: Prostate is unremarkable. Other: Moderate, progressive subcutaneous body wall edema, best appreciated within flanks  bilaterally. Mild retroperitoneal edema again noted diffusely. Musculoskeletal: No acute bone abnormality. No lytic or blastic bone lesion. Osseous structures are age appropriate. IMPRESSION: 1. No active gastrointestinal hemorrhage. 2. Beaded appearance of the renal arteries bilaterally in keeping with changes of fibromuscular dysplasia. 3. Progressive anasarca with progressive subcutaneous body wall edema, mild retroperitoneal edema, and small left and trace right pleural effusions. Mild ascites may reflect combination anasarca as well as portal venous hypertension. 4. Suspected cirrhosis. Superimposed hepatic steatosis. If indicated, this could be further assessed with hepatic elastography or trans venous tissue sampling. 5. Cholelithiasis. 6. Moderate ascending and descending colonic diverticulosis. Aortic Atherosclerosis (ICD10-I70.0). Electronically Signed   By: Worthy Heads M.D.   On: 08/15/2023 01:42   US  EKG SITE RITE Result Date: 08/14/2023 If Site Rite image not attached, placement could not be confirmed due to current cardiac rhythm.   Medications: I have reviewed the patient's current medications.  Assessment: Duodenal ulcer with visible vessel, treated with epinephrine injection, bipolar gold cautery and Purastat gel application, unsuccessful treatment with ongoing bleeding noted Continued on IV Levophed  Continued on pantoprazole  40 mg every 12 hours CT angio 08/15/2023: No active bleeding, progressive anasarca, mild ascites and pleural effusion, portal hypertension, cirrhosis, ascending and descending diverticulosis  Hemoglobin 6.4 at midnight, subsequently 8.6, dropped again to 5.6, resuscitated to 7.3 and  8.1 today morning  Elevated BUN of 42 with a creatinine of 1.45 and GFR 52, urine output 300 mL in last 24 hours  Cirrhosis, total bili 15.5/AST 309/ALT 128/ALP 31, sodium 139, MELD sodium score 30 PT 26.4/INR 2.4, has received vitamin K 10 mg IV x 2 Status post paracentesis,  no SBP on/23/25, WBC 153, neutrophil 46%, ascitic fluid albumin  less than 1.5, ascitic fluid total protein less than 3, no malignant cells Hepatic steatosis continued on prednisone 40 mg daily since 08/12/2023  Thrombocytopenia, platelet 98 Acidotic, bicarb 14 Hypokalemia, potassium 3.3 Hypocalcemia, 7.2 Elevated phosphorus 9.8 Low magnesium  1.6 Malnutrition, albumin  2.2, total protein 4.3 Elevated ammonia 63, clinically not encephalopathic, on lactulose  30 g 3 times a day  Admitted with respiratory failure requiring mechanical ventilation, treated with N-acetylcysteine  and prednisolone , extubated on 08/10/2023.   Plan: Guarded prognosis, if there is evidence of further bleed and drop in hemoglobin, recommend empiric embolization of GDA by IR as duodenal ulcer with visible vessel has been treated maximally with endoscopic therapy(epinephrine injection, cauterization and Purastat gel).  So far has received 8 units PRBC, is receiving third unit of FFP and has received 1 unit of platelet transfusion.  Will add sucralfate 1 g / 10 mL 4 times a day along with PPI 40 mg twice daily. Guarded prognosis.  Genell Ken, MD 08/15/2023, 9:49 AM

## 2023-08-15 NOTE — Progress Notes (Signed)
 NAME:  Kevin Velez, MRN:  [id number], DOB:  1950/06/01, LOS: 7 ADMISSION DATE:  08/08/2023, CONSULTATION DATE: 4/27 REFERRING MD: Dr. Elsworth Halt, CHIEF COMPLAINT: Shock  History of Present Illness:  73 year old man with history of alcohol use, clear PMH but presumed underlying cirrhosis, admitted from group home 4/21 with acute encephalopathy, weakness, shock.  Found to have fulminant acute hepatic failure with coagulopathy, renal failure, profound anemia.  Required intubation mechanical ventilation, treated with n-acetylcysteine  (Tylenol  level negative), prednisolone  possible alcoholic hepatitis, Zosyn  for possible infectious cause including biliary obstruction, lactulose .  Volume resuscitation and PRBC resuscitation initiated. Diagnostic paracentesis done 4/23 > negative.  Extubated on 4/23, started midodrine  4/24 and pressors weaned to off.  Has continued to have dark stools.  Back to stepdown unit on 4/27 due to evolving hypotension, persistent anemia with maroon stool and clots, hemoglobin 8.8 > 8.0 > 6.4 on 4/27.  Started on norepinephrine  and uptitrating, currently at 10.  Just received 1 unit PRBC and 2 more are ordered.  PCCM consulted given his recurrent shock.   Pertinent  Medical History  Unclear PMH, history of cirrhosis, question due to alcohol use  Significant Hospital Events: Including procedures, antibiotic start and stop dates in addition to other pertinent events   4/27 back to the ICU with hematochezia and shock 4/27 EGD >> large duodenal ulcer, visible vessel with active bleeding treated with epinephrine injection, cautery, small nonbleeding esophageal varices  Interim History / Subjective:   More bleeding overnight requiring 2 units PRBC, platelets and FFP. Hemoglobin dropped to 5.6 and improved to 8.1 after 2 units. Remains critically ill, on Levophed  at max 24 mics but now back down to 5 mics    Objective   Blood pressure 116/69, pulse 87, temperature 97.7 F (36.5  C), temperature source Oral, resp. rate 14, height 5\' 9"  (1.753 m), weight 55.3 kg, SpO2 96%.        Intake/Output Summary (Last 24 hours) at 08/15/2023 0758 Last data filed at 08/15/2023 0746 Gross per 24 hour  Intake 3719.6 ml  Output 300 ml  Net 3419.6 ml   Filed Weights   08/11/23 0500 08/12/23 0100 08/12/23 0238  Weight: 53.1 kg 55 kg 55.3 kg    Examination: General: Ill-appearing man, laying in bed in no distress HENT: Scleral icterus, oropharynx moist without bleeding Lungs: Clear bilateral, no accessory muscle use Cardiovascular: Regular, no murmur Abdomen: Soft, nontender, no guarding Extremities: No significant edema Neuro: Awake, alert, interacting appropriately, follows commands.  Labs show hemoglobin 8.1, mild leukocytosis, platelets 98K, INR 2.4, mild hypokalemia 3.3, BUN/creatinine 42/1.4, hypomagnesemia 1.6 , bump in LFTs, bilirubin decreasing to 15.5  Resolved Hospital Problem list   Acute renal failure Acute respiratory failure requiring mechanical ventilation  Assessment & Plan:   Hemorrhagic shock.  Has been treated since presentation for possible component sepsis,, no clear source but considered biliary obstruction, ultrasound reassuring.  Cultures negative Acute blood loss anemia - Echocardiogram 4/22 with intact LV function - follow serial CBC, transfuse for hemoglobin less than 7 -Norepinephrine  decreasing -Remains on Zosyn  since presentation for possible sepsis contribution to his shock - Continue midodrine   Acute GI bleeding, bleeding duodenal ulcer with visible vessel Repeat CTA abdomen 4/28 does not show further bleeding - Appreciate GI evaluation.   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 - Remains on prednisolone  - Completed n-acetylcysteine , Tylenol  was negative  Hepatic encephalopathy - Lactulose  ordered  Hepatic coagulopathy -Vitamin K daily x 3 - FFP to correct INR less than 1.5 if possible  Thrombocytopenia due to cirrhosis, alcohol use - Following CBC - In setting of active bleeding platelet goal > 50k  Pancreatitis, suspect alcohol induced - No reported evidence of obstruction in the biliary system and lipase clearing -Creon  ordered  Severe protein calorie malnutrition -Receiving supplements  Best Practice (right click and "Reselect all SmartList Selections" daily)   Diet/type: NPO DVT prophylaxis SCD Pressure ulcer(s): N/A GI prophylaxis: PPI Lines: N/A Foley:  N/A Code Status:  full code Last date of multidisciplinary goals of care discussion [pending] Updated brother at bedside  Labs   CBC: Recent Labs  Lab 08/08/23 1336 08/08/23 1610 08/09/23 0251 08/11/23 0534 08/12/23 0423 08/13/23 0755 08/14/23 0055 08/14/23 1455 08/14/23 1750 08/15/23 0219 08/15/23 0519  WBC 16.2* 13.7*   < > 19.9* 21.5* 18.2* 18.6*  --   --   --  15.8*  NEUTROABS 11.9* 10.7*  --   --   --   --   --   --   --   --   --   HGB 3.3* 3.4*   < > 8.3* 8.8* 8.0* 6.4* 8.6* 5.6* 7.3* 8.1*  HCT 9.7* 9.7*   < > 23.7* 26.0* 24.3* 19.2* 25.8* 16.3* 22.4* 23.5*  MCV 109.0* 107.8*   < > 90.1 92.5 94.2 92.8  --   --   --  84.5  PLT 211 136*   < > 85* 72* 70* 77*  --   --   --  98*   < > = values in this interval not displayed.    Basic Metabolic Panel: Recent Labs  Lab 08/09/23 0252 08/09/23 2216 08/11/23 0534 08/12/23 0423 08/13/23 0755 08/14/23 0055 08/15/23 0519  NA  --    < > 135 137 144 145 139  K  --    < > 3.5 3.2* 3.4* 3.7 3.3*  CL  --    < > 105 105 113* 116* 105  CO2  --    < > 19* 20* 21* 19* 14*  GLUCOSE  --    < > 140* 159* 154* 145* 235*  BUN  --    < > 50* 41* 36* 40* 42*  CREATININE  --    < > 0.84 0.78 0.58* 0.91 1.45*  CALCIUM  --     < > 7.9* 8.1* 8.8* 8.7* 7.2*  MG 1.7  --   --   --   --  1.9 1.6*  PHOS 2.8  --   --   --   --  1.9* 9.8*   < > = values in this interval not displayed.   GFR: Estimated Creatinine Clearance: 36.5 mL/min (A) (by C-G formula based on SCr of 1.45 mg/dL (H)). Recent Labs  Lab 08/08/23 1417 08/08/23 1610 08/08/23 1624 08/09/23 0251 08/12/23 0423 08/13/23 0755 08/14/23 0055 08/15/23 0519  WBC  --    < >  --    < > 21.5* 18.2* 18.6* 15.8*  LATICACIDVEN 7.2*  --  6.7*  --   --   --   --   --    < > = values in this interval not displayed.    Liver Function Tests: Recent Labs  Lab 08/11/23 0534 08/12/23 0423 08/13/23 0755 08/14/23 0055 08/15/23 0519  AST  142* 129* 144* 139* 309*  ALT 53* 63* 78* 79* 128*  ALKPHOS 52 57 61 55 31*  BILITOT 17.1* 17.4* 18.7* 16.8* 15.5*  PROT 6.3* 6.4* 6.3* 5.5* 4.3*  ALBUMIN  2.5* 2.3* 2.1* 1.8* 2.2*   Recent Labs  Lab 08/10/23 0537 08/12/23 0423 08/15/23 0519  LIPASE 679* 354* 117*   Recent Labs  Lab 08/08/23 1420 08/15/23 0519  AMMONIA 122* 63*    ABG    Component Value Date/Time   PHART 7.5 (H) 08/08/2023 2100   PCO2ART 25 (L) 08/08/2023 2100   PO2ART 305 (H) 08/08/2023 2100   HCO3 19.5 (L) 08/08/2023 2100   TCO2 31 03/20/2007 1527   ACIDBASEDEF 2.1 (H) 08/08/2023 2100   O2SAT 99.9 08/08/2023 2100     Coagulation Profile: Recent Labs  Lab 08/09/23 0252 08/10/23 0537 08/11/23 0534 08/14/23 0055 08/15/23 0519  INR 2.6* 2.8* 2.2* 2.2* 2.4*    Cardiac Enzymes: Recent Labs  Lab 08/08/23 1610  CKTOTAL 14*    HbA1C: Hgb A1c MFr Bld  Date/Time Value Ref Range Status  08/10/2023 09:56 PM 4.7 (L) 4.8 - 5.6 % Final    Comment:    (NOTE) Pre diabetes:          5.7%-6.4%  Diabetes:              >6.4%  Glycemic control for   <7.0% adults with diabetes   08/08/2023 04:10 PM QNSTST 4.8 - 5.6 % Final    Comment:    (NOTE) Test not performed. Insufficient specimen to perform or complete analysis. Montie Apley notified 08/10/2023-[place]         Prediabetes: 5.7 - 6.4         Diabetes: >6.4         Glycemic control for adults with diabetes: <7.0     CBG: Recent Labs  Lab 08/14/23 0426 08/14/23 0751 08/14/23 1153 08/14/23 1623 08/15/23 0746  GLUCAP 137* 82 79 197* 122*   Critical care time: 35 min     Rion Catala V. Villa Greaser MD 08/15/2023, 7:58 AM [facility] [phone] or if no answer before 7:00PM call [phone] For any issues after 7:00PM please call eLink [phone]

## 2023-08-15 NOTE — Plan of Care (Signed)
  Problem: Education: Goal: Ability to describe self-care measures that may prevent or decrease complications (Diabetes Survival Skills Education) will improve Outcome: Progressing Goal: Individualized Educational Video(s) Outcome: Progressing   Problem: Coping: Goal: Ability to adjust to condition or change in health will improve Outcome: Progressing   Problem: Fluid Volume: Goal: Ability to maintain a balanced intake and output will improve Outcome: Progressing   Problem: Health Behavior/Discharge Planning: Goal: Ability to identify and utilize available resources and services will improve Outcome: Progressing Goal: Ability to manage health-related needs will improve Outcome: Progressing   Problem: Tissue Perfusion: Goal: Adequacy of tissue perfusion will improve Outcome: Progressing   Problem: Health Behavior/Discharge Planning: Goal: Ability to manage health-related needs will improve Outcome: Progressing   Problem: Clinical Measurements: Goal: Ability to maintain clinical measurements within normal limits will improve Outcome: Progressing Goal: Will remain free from infection Outcome: Progressing Goal: Diagnostic test results will improve Outcome: Progressing Goal: Respiratory complications will improve Outcome: Progressing Goal: Cardiovascular complication will be avoided Outcome: Progressing   Problem: Coping: Goal: Level of anxiety will decrease Outcome: Progressing   Problem: Elimination: Goal: Will not experience complications related to bowel motility Outcome: Progressing Goal: Will not experience complications related to urinary retention Outcome: Progressing   Problem: Pain Managment: Goal: General experience of comfort will improve and/or be controlled Outcome: Progressing   Problem: Safety: Goal: Ability to remain free from injury will improve Outcome: Progressing   Problem: Skin Integrity: Goal: Risk for impaired skin integrity will  decrease Outcome: Progressing   Problem: Safety: Goal: Non-violent Restraint(s) Outcome: Progressing

## 2023-08-15 NOTE — Care Plan (Signed)
 Patient continues to remain hypotensive requiring levophed  support ,  He remains under ICU service. We will sign off.

## 2023-08-15 NOTE — Progress Notes (Signed)
 PT Cancellation Note  Patient Details Name: Kevin Velez MRN: [id number] DOB: 04/09/1951   Cancelled Treatment:    Reason Eval/Treat Not Completed: Medical issues which prohibited therapy Has been bleeding and receiving blood products.  Abelina Hoes PT Acute Rehabilitation Services Office [phone] Weekend pager-[phone]   Dareen Ebbing 08/15/2023, 7:16 AM

## 2023-08-15 NOTE — Plan of Care (Signed)
  Problem: Education: Goal: Ability to describe self-care measures that may prevent or decrease complications (Diabetes Survival Skills Education) will improve Outcome: Progressing Goal: Individualized Educational Video(s) Outcome: Progressing   Problem: Coping: Goal: Ability to adjust to condition or change in health will improve Outcome: Progressing   Problem: Fluid Volume: Goal: Ability to maintain a balanced intake and output will improve Outcome: Progressing   Problem: Metabolic: Goal: Ability to maintain appropriate glucose levels will improve Outcome: Progressing   Problem: Skin Integrity: Goal: Risk for impaired skin integrity will decrease Outcome: Progressing   Problem: Tissue Perfusion: Goal: Adequacy of tissue perfusion will improve Outcome: Progressing   Problem: Clinical Measurements: Goal: Ability to maintain clinical measurements within normal limits will improve Outcome: Progressing Goal: Will remain free from infection Outcome: Progressing Goal: Diagnostic test results will improve Outcome: Progressing Goal: Respiratory complications will improve Outcome: Progressing Goal: Cardiovascular complication will be avoided Outcome: Progressing   Problem: Activity: Goal: Risk for activity intolerance will decrease Outcome: Progressing   Problem: Elimination: Goal: Will not experience complications related to bowel motility Outcome: Progressing Goal: Will not experience complications related to urinary retention Outcome: Progressing   Problem: Pain Managment: Goal: General experience of comfort will improve and/or be controlled Outcome: Progressing   Problem: Skin Integrity: Goal: Risk for impaired skin integrity will decrease Outcome: Progressing   Problem: Skin Integrity: Goal: Risk for impaired skin integrity will decrease Outcome: Progressing   Problem: Safety: Goal: Non-violent Restraint(s) Outcome: Progressing

## 2023-08-15 NOTE — Progress Notes (Signed)
 eLink Physician-Brief Progress Note Patient Name: Kevin Velez DOB: Oct 17, 1950 MRN: [id number]   Date of Service  08/15/2023  HPI/Events of Note  Elink hand off: To follow up on Hg, fibrinogen for GI bleeding.s/p 8 PRBC/3 FFP /1 platelts so far.  On levophed .on PPI q12 . CTA from 28 th no active bleeding.   Anemia stable > 8 Fibrinogen from AM low.   eICU Interventions  Conas per GI guarded prognosis. If worsens to consider IR intervention. tinue care.      Intervention Category Intermediate Interventions: Other:  Rexann Catalan 08/15/2023, 9:56 PM

## 2023-08-15 NOTE — Progress Notes (Addendum)
 eLink Physician-Brief Progress Note Patient Name: Kevin Velez DOB: Jan 28, 1951 MRN: [id number]   Date of Service  08/15/2023  HPI/Events of Note  Notified of active bleeding.  Pt is getting 5th unit of pRBC now but is bleeding significantly from the rectum. Pt is s/p EGD with large duodenal ulcer with large visible vessel and active bleeding treated with epinephrine injection and gold probe cautery.  INR 2.2, last hgb at 5.6.  Pt remains on levophed .  He is awake and alert, not in respiratory distress.   eICU Interventions  Get CTA abdomen with IV contrast.  FFP and platelets ordered. Vitamin K 10mg .  Called IR and spoke with Dr. Darylene Epley who recommended to correct coagulopathy.    Spoke to GI Dr. Feliberto Hopping who also recommended IR embolization. She will try to communicate with IR as well.   Transfuse 2 more units pRBCs.      Intervention Category Major Interventions: Hemorrhage - evaluation and management  Lanell Pinta 08/15/2023, 12:41 AM  1:49 AM CTA of the abdomen 1. No active gastrointestinal hemorrhage. 2. Beaded appearance of the renal arteries bilaterally in keeping with changes of fibromuscular dysplasia. 3. Progressive anasarca with progressive subcutaneous body wall edema, mild retroperitoneal edema, and small left and trace right pleural effusions. Mild ascites may reflect combination anasarca as well as portal venous hypertension. 4. Suspected cirrhosis. Superimposed hepatic steatosis. If indicated, this could be further assessed with hepatic elastography or trans venous tissue sampling. 5. Cholelithiasis. 6. Moderate ascending and descending colonic diverticulosis.    Plan> Continue correcting coagulopathy.  Check H&H now. Repeat CBC, PT/INR in the AM.   3:21 AM H&H now at 7.3.   Levophed  requirements are decreasing.   Plan> Hold further transfusion of pRBCs unless he has another bloody bowel movement.  Transfuse platelets and FFP.  7:01 AM INR this  morning at 2.4, platelets improved to 98.  No recurrence of bleeding.   Plan> Transfuse FFP and give vitamin K.  Continue to trend H&H.

## 2023-08-16 DIAGNOSIS — D62 Acute posthemorrhagic anemia: Secondary | ICD-10-CM | POA: Diagnosis not present

## 2023-08-16 DIAGNOSIS — K922 Gastrointestinal hemorrhage, unspecified: Secondary | ICD-10-CM | POA: Diagnosis not present

## 2023-08-16 DIAGNOSIS — R571 Hypovolemic shock: Secondary | ICD-10-CM | POA: Diagnosis not present

## 2023-08-16 DIAGNOSIS — K7200 Acute and subacute hepatic failure without coma: Secondary | ICD-10-CM | POA: Diagnosis not present

## 2023-08-16 LAB — COMPREHENSIVE METABOLIC PANEL WITH GFR
ALT: 150 U/L — ABNORMAL HIGH (ref 0–44)
AST: 263 U/L — ABNORMAL HIGH (ref 15–41)
Albumin: 2.5 g/dL — ABNORMAL LOW (ref 3.5–5.0)
Alkaline Phosphatase: 50 U/L (ref 38–126)
Anion gap: 9 (ref 5–15)
BUN: 46 mg/dL — ABNORMAL HIGH (ref 8–23)
CO2: 21 mmol/L — ABNORMAL LOW (ref 22–32)
Calcium: 8.7 mg/dL — ABNORMAL LOW (ref 8.9–10.3)
Chloride: 114 mmol/L — ABNORMAL HIGH (ref 98–111)
Creatinine, Ser: 1.31 mg/dL — ABNORMAL HIGH (ref 0.61–1.24)
GFR, Estimated: 58 mL/min — ABNORMAL LOW (ref >60)
Glucose, Bld: 100 mg/dL — ABNORMAL HIGH (ref 70–99)
Potassium: 3.2 mmol/L — ABNORMAL LOW (ref 3.5–5.1)
Sodium: 144 mmol/L (ref 135–145)
Total Bilirubin: 19.2 mg/dL (ref 0.0–1.2)
Total Protein: 5.6 g/dL — ABNORMAL LOW (ref 6.5–8.1)

## 2023-08-16 LAB — BPAM FFP
Blood Product Expiration Date: 202505022359
Blood Product Expiration Date: 202505022359
Blood Product Expiration Date: 202505032359
Blood Product Expiration Date: 202505032359
ISSUE DATE / TIME: 202504280137
ISSUE DATE / TIME: 202504280257
ISSUE DATE / TIME: 202504280922
ISSUE DATE / TIME: 202504281119
Unit Type and Rh: 5100
Unit Type and Rh: 9500
Unit Type and Rh: 5100
Unit Type and Rh: 5100

## 2023-08-16 LAB — BPAM PLATELET PHERESIS
Blood Product Expiration Date: 202504302359
ISSUE DATE / TIME: 202504280459
Unit Type and Rh: 5100

## 2023-08-16 LAB — CBC WITH DIFFERENTIAL/PLATELET
Abs Immature Granulocytes: 0.17 10*3/uL — ABNORMAL HIGH (ref 0.00–0.07)
Basophils Absolute: 0 10*3/uL (ref 0.0–0.1)
Basophils Relative: 0 %
Eosinophils Absolute: 0.3 10*3/uL (ref 0.0–0.5)
Eosinophils Relative: 1 %
HCT: 23.6 % — ABNORMAL LOW (ref 39.0–52.0)
Hemoglobin: 8.2 g/dL — ABNORMAL LOW (ref 13.0–17.0)
Immature Granulocytes: 1 %
Lymphocytes Relative: 8 %
Lymphs Abs: 1.6 10*3/uL (ref 0.7–4.0)
MCH: 29.3 pg (ref 26.0–34.0)
MCHC: 34.7 g/dL (ref 30.0–36.0)
MCV: 84.3 fL (ref 80.0–100.0)
Monocytes Absolute: 1.4 10*3/uL — ABNORMAL HIGH (ref 0.1–1.0)
Monocytes Relative: 7 %
Neutro Abs: 15.3 10*3/uL — ABNORMAL HIGH (ref 1.7–7.7)
Neutrophils Relative %: 83 %
Platelets: 51 10*3/uL — ABNORMAL LOW (ref 150–400)
RBC: 2.8 MIL/uL — ABNORMAL LOW (ref 4.22–5.81)
RDW: 18.7 % — ABNORMAL HIGH (ref 11.5–15.5)
WBC: 18.7 10*3/uL — ABNORMAL HIGH (ref 4.0–10.5)
nRBC: 0.8 % — ABNORMAL HIGH (ref 0.0–0.2)

## 2023-08-16 LAB — PREPARE CRYOPRECIPITATE: Unit division: 0

## 2023-08-16 LAB — PREPARE FRESH FROZEN PLASMA

## 2023-08-16 LAB — PREPARE PLATELET PHERESIS: Unit division: 0

## 2023-08-16 LAB — PROTIME-INR
INR: 1.9 — ABNORMAL HIGH (ref 0.8–1.2)
Prothrombin Time: 22.3 s — ABNORMAL HIGH (ref 11.4–15.2)

## 2023-08-16 LAB — GLUCOSE, CAPILLARY
Glucose-Capillary: 97 mg/dL (ref 70–99)
Glucose-Capillary: 108 mg/dL — ABNORMAL HIGH (ref 70–99)
Glucose-Capillary: 125 mg/dL — ABNORMAL HIGH (ref 70–99)
Glucose-Capillary: 134 mg/dL — ABNORMAL HIGH (ref 70–99)

## 2023-08-16 LAB — BPAM CRYOPRECIPITATE
Blood Product Expiration Date: 202504281807
ISSUE DATE / TIME: 202504281337
Unit Type and Rh: 6200

## 2023-08-16 LAB — FIBRINOGEN: Fibrinogen: 274 mg/dL (ref 210–475)

## 2023-08-16 LAB — HEMOGLOBIN AND HEMATOCRIT, BLOOD
HCT: 26.4 % — ABNORMAL LOW (ref 39.0–52.0)
HCT: 22.5 % — ABNORMAL LOW (ref 39.0–52.0)
Hemoglobin: 8.8 g/dL — ABNORMAL LOW (ref 13.0–17.0)
Hemoglobin: 7.7 g/dL — ABNORMAL LOW (ref 13.0–17.0)

## 2023-08-16 LAB — MAGNESIUM: Magnesium: 2.2 mg/dL (ref 1.7–2.4)

## 2023-08-16 LAB — PHOSPHORUS: Phosphorus: 3.8 mg/dL (ref 2.5–4.6)

## 2023-08-16 MED ORDER — POTASSIUM CHLORIDE 10 MEQ/50ML IV SOLN
10.0000 meq | INTRAVENOUS | Status: AC
  Administered 2023-08-16 (×6): 10 meq via INTRAVENOUS
  Filled 2023-08-16 (×6): qty 50

## 2023-08-16 MED ORDER — SODIUM CHLORIDE 0.9% IV SOLUTION: Freq: Once | INTRAVENOUS | Status: AC

## 2023-08-16 MED ORDER — BOOST / RESOURCE BREEZE PO LIQD CUSTOM
1.0000 | Freq: Three times a day (TID) | ORAL | Status: DC
  Administered 2023-08-17: 1 via ORAL

## 2023-08-16 NOTE — Progress Notes (Signed)
 [facility] ADULT ICU REPLACEMENT PROTOCOL   The patient does apply for the [facility] Adult ICU Electrolyte Replacment Protocol based on the criteria listed below:   1.Exclusion criteria: TCTS, ECMO, Dialysis, and Myasthenia Gravis patients 2. Is GFR >/= 30 ml/min? Yes.    Patient's GFR today is 58 3. Is SCr </= 2? Yes.   Patient's SCr is 1.31 mg/dL 4. Did SCr increase >/= 0.5 in 24 hours? No. 5.Pt's weight >40kg  Yes.   6. Abnormal electrolyte(s): K+ = 3.2  7. Electrolytes replaced per protocol 8.  Call MD STAT for K+ </= 2.5, Phos </= 1, or Mag </= 1 Physician:  Evelyn Hire. eMD   Jeffry Minister 08/16/2023 5:06 AM

## 2023-08-16 NOTE — Plan of Care (Signed)
  Problem: Education: Goal: Ability to describe self-care measures that may prevent or decrease complications (Diabetes Survival Skills Education) will improve Outcome: Progressing Goal: Individualized Educational Video(s) Outcome: Progressing   Problem: Coping: Goal: Ability to adjust to condition or change in health will improve Outcome: Progressing   Problem: Fluid Volume: Goal: Ability to maintain a balanced intake and output will improve Outcome: Progressing   Problem: Health Behavior/Discharge Planning: Goal: Ability to identify and utilize available resources and services will improve Outcome: Progressing Goal: Ability to manage health-related needs will improve Outcome: Progressing   Problem: Metabolic: Goal: Ability to maintain appropriate glucose levels will improve Outcome: Progressing   Problem: Skin Integrity: Goal: Risk for impaired skin integrity will decrease Outcome: Progressing   Problem: Tissue Perfusion: Goal: Adequacy of tissue perfusion will improve Outcome: Progressing   Problem: Health Behavior/Discharge Planning: Goal: Ability to manage health-related needs will improve Outcome: Progressing   Problem: Clinical Measurements: Goal: Ability to maintain clinical measurements within normal limits will improve Outcome: Progressing Goal: Will remain free from infection Outcome: Progressing Goal: Diagnostic test results will improve Outcome: Progressing Goal: Respiratory complications will improve Outcome: Progressing Goal: Cardiovascular complication will be avoided Outcome: Progressing   Problem: Activity: Goal: Risk for activity intolerance will decrease Outcome: Progressing   Problem: Nutrition: Goal: Adequate nutrition will be maintained Outcome: Progressing   Problem: Coping: Goal: Level of anxiety will decrease Outcome: Progressing   Problem: Elimination: Goal: Will not experience complications related to bowel motility Outcome:  Progressing Goal: Will not experience complications related to urinary retention Outcome: Progressing   Problem: Pain Managment: Goal: General experience of comfort will improve and/or be controlled Outcome: Progressing   Problem: Safety: Goal: Ability to remain free from injury will improve Outcome: Progressing   Problem: Skin Integrity: Goal: Risk for impaired skin integrity will decrease Outcome: Progressing   Problem: Safety: Goal: Non-violent Restraint(s) Outcome: Progressing

## 2023-08-16 NOTE — Progress Notes (Signed)
 Subjective: Last bowel movement was on 08/15/2023 at 8 PM described as soft, large, red. Denies abdominal pain. Was started on clear liquid diet today.  Objective: Vital signs in last 24 hours: Temp:  [97.5 F (36.4 C)-98.7 F (37.1 C)] 97.5 F (36.4 C) (04/29 0753) Pulse Rate:  [75-102] 87 (04/29 1015) Resp:  [12-37] 20 (04/29 1015) BP: (84-153)/(48-71) 104/67 (04/29 1015) SpO2:  [93 %-100 %] 98 % (04/29 1015) Weight change:  Last BM Date : 08/15/23  PE: Ill-appearing GENERAL: Deeply icteric, prominent pallor  ABDOMEN: Distended, tense, bowel sounds audible, nontender EXTREMITIES: No deformity  Lab Results: Results for orders placed or performed during the hospital encounter of 08/08/23 (from the past 48 hours)  Glucose, capillary     Status: None   Collection Time: 08/14/23 11:53 AM  Result Value Ref Range   Glucose-Capillary 79 70 - 99 mg/dL    Comment: Glucose reference range applies only to samples taken after fasting for at least 8 hours.  Hemoglobin and hematocrit, blood     Status: Abnormal   Collection Time: 08/14/23  2:55 PM  Result Value Ref Range   Hemoglobin 8.6 (L) 13.0 - 17.0 g/dL    Comment: REPEATED TO VERIFY POST TRANSFUSION SPECIMEN    HCT 25.8 (L) 39.0 - 52.0 %    Comment: Performed at [facility], [address] [place], [place] [address]  Glucose, capillary     Status: Abnormal   Collection Time: 08/14/23  4:23 PM  Result Value Ref Range   Glucose-Capillary 197 (H) 70 - 99 mg/dL    Comment: Glucose reference range applies only to samples taken after fasting for at least 8 hours.   Comment 1 Notify RN    Comment 2 Document in Chart   Hemoglobin and hematocrit, blood     Status: Abnormal   Collection Time: 08/14/23  5:50 PM  Result Value Ref Range   Hemoglobin 5.6 (LL) 13.0 - 17.0 g/dL    Comment: REPEATED TO VERIFY THIS CRITICAL RESULT HAS VERIFIED AND BEEN CALLED TO B.MAY, RN BY ATCHISON,MARY ON 04 27 2025 AT 1808, AND HAS  BEEN READ BACK.     HCT 16.3 (L) 39.0 - 52.0 %    Comment: Performed at [facility], [address], [place], [place] [address]  Prepare RBC (crossmatch)     Status: None   Collection Time: 08/14/23  6:32 PM  Result Value Ref Range   Order Confirmation      ORDER PROCESSED BY BLOOD BANK Performed at [facility], [address] [place], [place] [address]   Prepare fresh frozen plasma     Status: None   Collection Time: 08/15/23 12:05 AM  Result Value Ref Range   Unit Number [id number]    Blood Component Type THW PLS APHR    Unit division A0    Status of Unit ISSUED,FINAL    Transfusion Status      OK TO TRANSFUSE Performed at [facility], [address], [place], [place] [address]   Prepare RBC (crossmatch)     Status: None   Collection Time: 08/15/23 12:40 AM  Result Value Ref Range   Order Confirmation      ORDER PROCESSED BY BLOOD BANK Performed at [facility], [address], [place], [place] [address]   Prepare fresh frozen plasma     Status: None   Collection Time: 08/15/23 12:57 AM  Result Value Ref Range  Unit Number [id number]    Blood Component Type THW PLS APHR    Unit division B0    Status of Unit ISSUED,FINAL    Transfusion Status      OK TO TRANSFUSE Performed at [facility], [address] [place], [place] [address]   Hemoglobin and hematocrit, blood     Status: Abnormal   Collection Time: 08/15/23  2:19 AM  Result Value Ref Range   Hemoglobin 7.3 (L) 13.0 - 17.0 g/dL    Comment: REPEATED TO VERIFY POST TRANSFUSION SPECIMEN DELTA CHECK NOTED    HCT 22.4 (L) 39.0 - 52.0 %    Comment: Performed at [facility], [address] [place], [place] [address]  Prepare platelet pheresis     Status: None   Collection Time: 08/15/23  3:25 AM  Result Value Ref Range   Unit Number [id number]    Blood Component Type PLTP3 PSORALEN  TREATED    Unit division 00    Status of Unit ISSUED,FINAL    Transfusion Status      OK TO TRANSFUSE Performed at [facility], [address] [place], [place] [address]   Comprehensive metabolic panel with GFR     Status: Abnormal   Collection Time: 08/15/23  5:19 AM  Result Value Ref Range   Sodium 139 135 - 145 mmol/L   Potassium 3.3 (L) 3.5 - 5.1 mmol/L   Chloride 105 98 - 111 mmol/L   CO2 14 (L) 22 - 32 mmol/L   Glucose, Bld 235 (H) 70 - 99 mg/dL    Comment: Glucose reference range applies only to samples taken after fasting for at least 8 hours.   BUN 42 (H) 8 - 23 mg/dL   Creatinine, Ser 8.41 (H) 0.61 - 1.24 mg/dL   Calcium 7.2 (L) 8.9 - 10.3 mg/dL   Total Protein 4.3 (L) 6.5 - 8.1 g/dL   Albumin  2.2 (L) 3.5 - 5.0 g/dL   AST 324 (H) 15 - 41 U/L   ALT 128 (H) 0 - 44 U/L   Alkaline Phosphatase 31 (L) 38 - 126 U/L   Total Bilirubin 15.5 (H) 0.0 - 1.2 mg/dL   GFR, Estimated 52 (L) >60 mL/min    Comment: (NOTE) Calculated using the CKD-EPI Creatinine Equation (2021)    Anion gap 20 (H) 5 - 15    Comment: Performed at [facility], [address], [place], [place] [address]  CBC     Status: Abnormal   Collection Time: 08/15/23  5:19 AM  Result Value Ref Range   WBC 15.8 (H) 4.0 - 10.5 K/uL   RBC 2.78 (L) 4.22 - 5.81 MIL/uL   Hemoglobin 8.1 (L) 13.0 - 17.0 g/dL   HCT 72.5 (L) 36.6 - 44.0 %   MCV 84.5 80.0 - 100.0 fL    Comment: REPEATED TO VERIFY DELTA CHECK NOTED    MCH 29.1 26.0 - 34.0 pg   MCHC 34.5 30.0 - 36.0 g/dL   RDW 34.7 (H) 42.5 - 95.6 %   Platelets 98 (L) 150 - 400 K/uL    Comment: Immature Platelet Fraction may be clinically indicated, consider ordering this additional test [id number]    nRBC 0.9 (H) 0.0 - 0.2 %    Comment: Performed at [facility], [address], [place], [place] [address]  Magnesium      Status: Abnormal   Collection Time: 08/15/23  5:19 AM  Result Value Ref Range  Magnesium  1.6 (L) 1.7 - 2.4 mg/dL    Comment: Performed at [facility], [address], [place], [place] [address]  Phosphorus     Status: Abnormal   Collection Time: 08/15/23  5:19 AM  Result Value Ref Range   Phosphorus 9.8 (H) 2.5 - 4.6 mg/dL    Comment: ICTERUS AT THIS LEVEL MAY AFFECT RESULT Performed at [facility], [address], [place], [place] [address]   Ammonia     Status: Abnormal   Collection Time: 08/15/23  5:19 AM  Result Value Ref Range   Ammonia 63 (H) 9 - 35 umol/L    Comment: Performed at [facility], [address] [place], [place] [address]  Lipase, blood     Status: Abnormal   Collection Time: 08/15/23  5:19 AM  Result Value Ref Range   Lipase 117 (H) 11 - 51 U/L    Comment: Performed at [facility], [address] [place], [place] [address]  Protime-INR     Status: Abnormal   Collection Time: 08/15/23  5:19 AM  Result Value Ref Range   Prothrombin Time 26.4 (H) 11.4 - 15.2 seconds   INR 2.4 (H) 0.8 - 1.2    Comment: (NOTE) INR goal varies based on device and disease states. Performed at [facility], [address], [place], [place] [address]   Fibrinogen     Status: Abnormal   Collection Time: 08/15/23  5:19 AM  Result Value Ref Range   Fibrinogen 132 (L) 210 - 475 mg/dL    Comment: (NOTE) Fibrinogen results may be underestimated in patients receiving thrombolytic therapy. Performed at [facility], [address], [place], [place] [address]   Prepare fresh frozen plasma     Status: None   Collection Time: 08/15/23  7:00 AM  Result Value Ref Range   Unit Number [id number]    Blood Component Type THW PLS APHR    Unit division B0    Status of Unit ISSUED,FINAL    Transfusion Status      OK TO TRANSFUSE Performed at [facility], [address], [place], [place] [address]    Unit Number [id number]     Blood Component Type THW PLS APHR    Unit division A0    Status of Unit ISSUED,FINAL    Transfusion Status OK TO TRANSFUSE   Glucose, capillary     Status: Abnormal   Collection Time: 08/15/23  7:46 AM  Result Value Ref Range   Glucose-Capillary 122 (H) 70 - 99 mg/dL    Comment: Glucose reference range applies only to samples taken after fasting for at least 8 hours.   Comment 1 Notify RN    Comment 2 Document in Chart   Hemoglobin and hematocrit, blood     Status: Abnormal   Collection Time: 08/15/23  9:03 AM  Result Value Ref Range   Hemoglobin 8.4 (L) 13.0 - 17.0 g/dL   HCT 38.7 (L) 56.4 - 33.2 %    Comment: Performed at [facility], [address] [place], [place] [address]  Prepare cryoprecipitate     Status: None   Collection Time: 08/15/23 10:46 AM  Result Value Ref Range   Unit Number [id number]    Blood Component Type CRYO,THAWED    Unit division 00    Status of Unit ISSUED,FINAL    Transfusion Status      OK TO TRANSFUSE Performed at  [facility], [address] [place], [place] [address]   Glucose, capillary     Status: Abnormal   Collection Time: 08/15/23 12:33 PM  Result Value Ref Range   Glucose-Capillary 114 (H) 70 - 99 mg/dL    Comment: Glucose reference range applies only to samples taken after fasting for at least 8 hours.   Comment 1 Notify RN    Comment 2 Document in Chart   Hemoglobin and hematocrit, blood     Status: Abnormal   Collection Time: 08/15/23  1:11 PM  Result Value Ref Range   Hemoglobin 8.1 (L) 13.0 - 17.0 g/dL   HCT 29.5 (L) 62.1 - 30.8 %    Comment: Performed at [facility], [address], [place], [place] [address]  Glucose, capillary     Status: Abnormal   Collection Time: 08/15/23  4:02 PM  Result Value Ref Range   Glucose-Capillary 113 (H) 70 - 99 mg/dL    Comment: Glucose reference range applies only to samples taken after fasting for at least 8 hours.   Comment 1  Notify RN    Comment 2 Document in Chart   Hemoglobin and hematocrit, blood     Status: Abnormal   Collection Time: 08/15/23  6:09 PM  Result Value Ref Range   Hemoglobin 8.1 (L) 13.0 - 17.0 g/dL   HCT 69.6 (L) 29.5 - 28.4 %    Comment: Performed at [facility], [address] [place], [place] [address]  CBC with Differential/Platelet     Status: Abnormal   Collection Time: 08/16/23  3:35 AM  Result Value Ref Range   WBC 18.7 (H) 4.0 - 10.5 K/uL   RBC 2.80 (L) 4.22 - 5.81 MIL/uL   Hemoglobin 8.2 (L) 13.0 - 17.0 g/dL   HCT 01.0 (L) 27.2 - 53.6 %   MCV 84.3 80.0 - 100.0 fL   MCH 29.3 26.0 - 34.0 pg   MCHC 34.7 30.0 - 36.0 g/dL   RDW 64.4 (H) 03.4 - 74.2 %   Platelets 51 (L) 150 - 400 K/uL    Comment: SPECIMEN CHECKED FOR CLOTS REPEATED TO VERIFY PLATELET COUNT CONFIRMED BY SMEAR    nRBC 0.8 (H) 0.0 - 0.2 %   Neutrophils Relative % 83 %   Neutro Abs 15.3 (H) 1.7 - 7.7 K/uL   Lymphocytes Relative 8 %   Lymphs Abs 1.6 0.7 - 4.0 K/uL   Monocytes Relative 7 %   Monocytes Absolute 1.4 (H) 0.1 - 1.0 K/uL   Eosinophils Relative 1 %   Eosinophils Absolute 0.3 0.0 - 0.5 K/uL   Basophils Relative 0 %   Basophils Absolute 0.0 0.0 - 0.1 K/uL   Immature Granulocytes 1 %   Abs Immature Granulocytes 0.17 (H) 0.00 - 0.07 K/uL    Comment: Performed at [facility], [address] [place], [place] [address]  Magnesium      Status: None   Collection Time: 08/16/23  3:35 AM  Result Value Ref Range   Magnesium  2.2 1.7 - 2.4 mg/dL    Comment: Performed at [facility], [address], [place], [place] [address]  Phosphorus     Status: None   Collection Time: 08/16/23  3:35 AM  Result Value Ref Range   Phosphorus 3.8 2.5 - 4.6 mg/dL    Comment: ICTERUS AT THIS LEVEL MAY AFFECT RESULT ICTERUS AT THIS LEVEL MAY AFFECT RESULT Performed at [facility], [address], [place], [place] [address]   Comprehensive  metabolic panel     Status: Abnormal   Collection Time: 08/16/23  3:35 AM  Result Value Ref Range   Sodium 144 135 - 145 mmol/L   Potassium 3.2 (L) 3.5 - 5.1 mmol/L   Chloride 114 (H) 98 - 111 mmol/L   CO2 21 (L) 22 - 32 mmol/L   Glucose, Bld 100 (H) 70 - 99 mg/dL    Comment: Glucose reference range applies only to samples taken after fasting for at least 8 hours.   BUN 46 (H) 8 - 23 mg/dL   Creatinine, Ser 6.44 (H) 0.61 - 1.24 mg/dL   Calcium 8.7 (L) 8.9 - 10.3 mg/dL   Total Protein 5.6 (L) 6.5 - 8.1 g/dL   Albumin  2.5 (L) 3.5 - 5.0 g/dL   AST 034 (H) 15 - 41 U/L   ALT 150 (H) 0 - 44 U/L   Alkaline Phosphatase 50 38 - 126 U/L   Total Bilirubin 19.2 (HH) 0.0 - 1.2 mg/dL    Comment: CRITICAL RESULT CALLED TO, READ BACK BY AND VERIFIED WITH S. WILLIS, RN 08/16/23 0409 BY K. DAVIS   GFR, Estimated 58 (L) >60 mL/min    Comment: (NOTE) Calculated using the CKD-EPI Creatinine Equation (2021)    Anion gap 9 5 - 15    Comment: Performed at [facility], [address] [place], [place] [address]  Protime-INR     Status: Abnormal   Collection Time: 08/16/23  3:40 AM  Result Value Ref Range   Prothrombin Time 22.3 (H) 11.4 - 15.2 seconds   INR 1.9 (H) 0.8 - 1.2    Comment: (NOTE) INR goal varies based on device and disease states. Performed at [facility], [address] [place], [place] [address]   Glucose, capillary     Status: None   Collection Time: 08/16/23  7:58 AM  Result Value Ref Range   Glucose-Capillary 97 70 - 99 mg/dL    Comment: Glucose reference range applies only to samples taken after fasting for at least 8 hours.  Fibrinogen     Status: None   Collection Time: 08/16/23  9:02 AM  Result Value Ref Range   Fibrinogen 274 210 - 475 mg/dL    Comment: (NOTE) Fibrinogen results may be underestimated in patients receiving thrombolytic therapy. Performed at [facility], [address] [place], [place]  [address]     Studies/Results: CT Angio Abd/Pel w/ and/or w/o Result Date: 08/15/2023 CLINICAL DATA:  Lower gastrointestinal hemorrhage EXAM: CT ANGIOGRAPHY ABDOMEN AND PELVIS WITH CONTRAST AND WITHOUT CONTRAST TECHNIQUE: Multidetector CT imaging of the abdomen and pelvis was performed using the standard protocol during bolus administration of intravenous contrast. Multiplanar reconstructed images and MIPs were obtained and reviewed to evaluate the vascular anatomy. RADIATION DOSE REDUCTION: This exam was performed according to the departmental dose-optimization program which includes automated exposure control, adjustment of the mA and/or kV according to patient size and/or use of iterative reconstruction technique. CONTRAST:  OMNIPAQUE  IOHEXOL  350 MG/ML SOLN COMPARISON:  08/10/2023 FINDINGS: VASCULAR Aorta: Normal caliber aorta without aneurysm, dissection, vasculitis or significant stenosis. Mild atherosclerotic calcification Celiac: Patent without evidence of aneurysm, dissection, vasculitis or significant stenosis. SMA: Patent without evidence of aneurysm, dissection, vasculitis or significant stenosis. Renals: Single renal arteries are seen bilaterally demonstrating wide patency. There is a beaded appearance involving the mid segment of the renal arteries bilaterally in keeping with changes of fibromuscular dysplasia.  No dissection or aneurysm. IMA: Patent without evidence of aneurysm, dissection, vasculitis or significant stenosis. Inflow: Patent without evidence of aneurysm, dissection, vasculitis or significant stenosis. Proximal Outflow: Bilateral common femoral and visualized portions of the superficial and profunda femoral arteries are patent without evidence of aneurysm, dissection, vasculitis or significant stenosis. Veins: No obvious venous abnormality within the limitations of this arterial phase study. Review of the MIP images confirms the above findings. NON-VASCULAR Lower chest: Trace  right and small left pleural effusions with associated bibasilar atelectasis. Mild emphysema. No acute abnormality. Hepatobiliary: Mildly nodular liver contour suggests changes of underlying cirrhosis. Superimposed mild hepatic steatosis. No enhancing intrahepatic mass. No intra or extrahepatic biliary ductal dilation. Cholelithiasis noted without superimposed pericholecystic inflammatory change. Pancreas: Unremarkable Spleen: Unremarkable Adrenals/Urinary Tract: Adrenal glands are unremarkable. Simple cortical cysts are seen within the kidneys bilaterally for which no follow-up imaging is recommended. The kidneys are otherwise unremarkable. Bladder unremarkable. Stomach/Bowel: Stable mild ascites. No active gastrointestinal hemorrhage. Moderate ascending and descending colonic diverticulosis. Stomach, small bowel, and large are otherwise unremarkable. Appendix normal. No free intraperitoneal gas. Lymphatic: No pathologic adenopathy within the abdomen and pelvis. Reproductive: Prostate is unremarkable. Other: Moderate, progressive subcutaneous body wall edema, best appreciated within flanks bilaterally. Mild retroperitoneal edema again noted diffusely. Musculoskeletal: No acute bone abnormality. No lytic or blastic bone lesion. Osseous structures are age appropriate. IMPRESSION: 1. No active gastrointestinal hemorrhage. 2. Beaded appearance of the renal arteries bilaterally in keeping with changes of fibromuscular dysplasia. 3. Progressive anasarca with progressive subcutaneous body wall edema, mild retroperitoneal edema, and small left and trace right pleural effusions. Mild ascites may reflect combination anasarca as well as portal venous hypertension. 4. Suspected cirrhosis. Superimposed hepatic steatosis. If indicated, this could be further assessed with hepatic elastography or trans venous tissue sampling. 5. Cholelithiasis. 6. Moderate ascending and descending colonic diverticulosis. Aortic Atherosclerosis  (ICD10-I70.0). Electronically Signed   By: Worthy Heads M.D.   On: 08/15/2023 01:42    Medications: I have reviewed the patient's current medications.  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 Started on clear liquid diet. Continue pantoprazole  40 mg IV every 12 hours. Started on Carafate 1 g suspension 4 times a day from yesterday.  On prednisolone  40 mg daily, for 28 days since 08/12/2023 for alcoholic hepatitis. On lactulose  30 g 3 times a day for hepatic encephalopathy. On pancreatic enzyme supplementation 3 times a day since 08/11/23?  Unclear indication, no evidence of pancreatic atrophy on CAT scan, this can likely be discontinued. Clinically, improvement noted, continue supportive management.  Genell Ken, MD 08/16/2023, 10:29 AM

## 2023-08-16 NOTE — Anesthesia Postprocedure Evaluation (Signed)
 Anesthesia Post Note  Patient: Kevin Velez  Procedure(s) Performed: EGD (ESOPHAGOGASTRODUODENOSCOPY) SCLEROTHERAPY EGD, WITH ARGON PLASMA COAGULATION/GOLD PROBE     Patient location during evaluation: SICU Anesthesia Type: MAC Level of consciousness: awake and lethargic Pain management: pain level controlled Vital Signs Assessment: post-procedure vital signs reviewed and stable Respiratory status: patient connected to face mask oxygen, spontaneous breathing and respiratory function stable Cardiovascular status: stable Postop Assessment: no apparent nausea or vomiting Anesthetic complications: no   No notable events documented.  Last Vitals:  Vitals:   08/16/23 0753 08/16/23 0800  BP:  116/67  Pulse:  87  Resp:  (!) 21  Temp: (!) 36.4 C   SpO2:  97%    Last Pain:  Vitals:   08/16/23 0753  TempSrc: Axillary  PainSc:                  Leslye Rast

## 2023-08-16 NOTE — Progress Notes (Signed)
 Physical Therapy Treatment Patient Details Name: Kevin Velez MRN: [id number] DOB: 1951-03-20 Today's Date: 08/16/2023   History of Present Illness 73 year old man  lives in a group home, presents via EMS 08/08/23  after several days of worsening lethargy, inability get out of  bed, weakness, found to be hypotensive , anemia with  hemoglobin of 3, acute liver failure with encephalopathy elevated INR and elevated LFTs, acute renal failure, intubated in the ED due to inability to protect airway.  Extubated 08/10/23    PT Comments  The patient is alert and  able to mobilize with mod support  . Patient stood for  hygiene, RN in room and aware of bloody  drainage from ? Rectum. Able to step to recliner using RW and mod support.   BP sitting 91/60,  after standing 76/54,  after transfer 91/60. RN in room. Patient remains quite weak. Patient will benefit from continued inpatient follow up therapy, <3 hours/day    If plan is discharge home, recommend the following: Two people to help with walking and/or transfers;A lot of help with bathing/dressing/bathroom;Assistance with cooking/housework;Direct supervision/assist for financial management;Help with stairs or ramp for entrance   Can travel by private vehicle        Equipment Recommendations  None recommended by PT    Recommendations for Other Services       Precautions / Restrictions Precautions Precautions: Fall Precaution/Restrictions Comments: uncontinent, BP low Restrictions Weight Bearing Restrictions Per Provider Order: No     Mobility  Bed Mobility   Bed Mobility: Supine to Sit     Supine to sit: Mod assist     General bed mobility comments: multimodal cues for initiating  moving les, Assist with trunk    Transfers Overall transfer level: Needs assistance Equipment used: Rolling walker (2 wheels) Transfers: Sit to/from Stand, Bed to chair/wheelchair/BSC Sit to Stand: Mod assist, +2 safety/equipment            General transfer comment: assistance to rise from bed at [facility], VC's on proper hand placement.  Initial posterior lean.  Slight hips/knees flexed. head flexed. Stood x 4 minutes then step to recliner using Rw and mod support.    Ambulation/Gait                   Stairs             Wheelchair Mobility     Tilt Bed    Modified Rankin (Stroke Patients Only)       Balance Overall balance assessment: Needs assistance Sitting-balance support: Bilateral upper extremity supported, Feet supported Sitting balance-Leahy Scale: Fair     Standing balance support: During functional activity, Bilateral upper extremity supported, Reliant on assistive device for balance Standing balance-Leahy Scale: Poor                              Communication Communication Factors Affecting Communication: Reduced clarity of speech  Cognition Arousal: Alert Behavior During Therapy: Flat affect   PT - Cognitive impairments: Difficult to assess                       PT - Cognition Comments: AxO x 2 slightly slow to respond and move. Following commands: Intact      Cueing Cueing Techniques: Tactile cues, Verbal cues  Exercises      General Comments        Pertinent Vitals/Pain Pain Assessment Pain Assessment: No/denies  pain Faces Pain Scale: Hurts little more    Home Living                          Prior Function            PT Goals (current goals can now be found in the care plan section) Progress towards PT goals: Progressing toward goals    Frequency    Min 2X/week      PT Plan      Co-evaluation              AM-PAC PT "6 Clicks" Mobility   Outcome Measure  Help needed turning from your back to your side while in a flat bed without using bedrails?: A Lot Help needed moving from lying on your back to sitting on the side of a flat bed without using bedrails?: A Lot Help needed moving to and from a bed to a chair  (including a wheelchair)?: A Lot Help needed standing up from a chair using your arms (e.g., wheelchair or bedside chair)?: A Lot Help needed to walk in hospital room?: Total Help needed climbing 3-5 steps with a railing? : Total 6 Click Score: 10    End of Session Equipment Utilized During Treatment: Gait belt Activity Tolerance: Patient limited by fatigue Patient left: with call bell/phone within reach;in chair;with nursing/sitter in room Nurse Communication: Mobility status PT Visit Diagnosis: Unsteadiness on feet (R26.81);Adult, failure to thrive (R62.7);Muscle weakness (generalized) (M62.81)     Time: 4098-1191 PT Time Calculation (min) (ACUTE ONLY): 23 min  Charges:    $Therapeutic Activity: 23-37 mins PT General Charges $$ ACUTE PT VISIT: 1 Visit                     Abelina Hoes PT Acute Rehabilitation Services Office [phone] Weekend [phone]    Dareen Ebbing 08/16/2023, 2:59 PM

## 2023-08-16 NOTE — Progress Notes (Signed)
 NAME:  Kevin Velez, MRN:  [id number], DOB:  07/06/1950, LOS: 8 ADMISSION DATE:  08/08/2023, CONSULTATION DATE: 4/27 REFERRING MD: Dr. Elsworth Halt, CHIEF COMPLAINT: Shock  History of Present Illness:  73 year old man with history of alcohol use, clear PMH but presumed underlying cirrhosis, admitted from group home 4/21 with acute encephalopathy, weakness, shock.  Found to have fulminant acute hepatic failure with coagulopathy, renal failure, profound anemia.  Required intubation mechanical ventilation, treated with n-acetylcysteine  (Tylenol  level negative), prednisolone  possible alcoholic hepatitis, Zosyn  for possible infectious cause including biliary obstruction, lactulose .  Volume resuscitation and PRBC resuscitation initiated. Diagnostic paracentesis done 4/23 > negative.  Extubated on 4/23, started midodrine  4/24 and pressors weaned to off.  Has continued to have dark stools.  Back to stepdown unit on 4/27 due to evolving hypotension, persistent anemia with maroon stool and clots, hemoglobin 8.8 > 8.0 > 6.4 on 4/27.  Started on norepinephrine  and uptitrating, currently at 10.  Just received 1 unit PRBC and 2 more are ordered.  PCCM consulted given his recurrent shock.   Pertinent  Medical History  Unclear PMH, history of cirrhosis, question due to alcohol use  Significant Hospital Events: Including procedures, antibiotic start and stop dates in addition to other pertinent events   4/27 back to the ICU with hematochezia and shock 4/27 EGD >> large duodenal ulcer, visible vessel with active bleeding treated with epinephrine injection, cautery, small nonbleeding esophageal varices 4/28 More bleeding overnight requiring 2 units PRBC, platelets and FFP. Hemoglobin dropped to 5.6 and improved to 8.1 after 2 units.  Interim History / Subjective:   Critically ill, remains on low-dose Levophed  1 episode of melena overnight Complains of abdominal distention, no nausea    Objective   Blood  pressure 116/67, pulse 87, temperature (!) 97.5 F (36.4 C), temperature source Axillary, resp. rate (!) 21, height 5\' 9"  (1.753 m), weight 55.3 kg, SpO2 97%.        Intake/Output Summary (Last 24 hours) at 08/16/2023 0853 Last data filed at 08/16/2023 0830 Gross per 24 hour  Intake 1724.01 ml  Output 1000 ml  Net 724.01 ml   Filed Weights   08/11/23 0500 08/12/23 0100 08/12/23 0238  Weight: 53.1 kg 55 kg 55.3 kg    Examination: General: Ill-appearing man, laying in bed in no distress HENT: Scleral icterus, oropharynx moist without bleeding Lungs: No active muscle use, clear breath sounds bilateral Cardiovascular: Regular, no murmur Abdomen: Soft, nontender, no hepatosplenomegaly no guarding Extremities: No significant edema Neuro: Awake, alert, interacting appropriately, follows commands.  Labs show hemoglobin steady 8.1, increasing leukocytosis, platelets dropped to 51K, INR decreased to 1.9, mild hypokalemia 3.2, improving BUN/creatinine 46/1.3, elevated LFTs, increase in bilirubin from 1519  Resolved Hospital Problem list   Acute renal failure Acute respiratory failure requiring mechanical ventilation  Assessment & Plan:   Hemorrhagic shock.  Has been treated since presentation for possible component sepsis,, no clear source but considered biliary obstruction, ultrasound reassuring.  Cultures negative Acute blood loss anemia - Echocardiogram 4/22 with intact LV function - follow serial CBC, decrease frequency to every 8 transfuse for hemoglobin less than 7 -Norepinephrine  can be titrated down -dc Zosyn  after 7 days - Start midodrine   Acute GI bleeding, bleeding duodenal ulcer with visible vessel Repeat CTA abdomen 4/28 does not show further bleeding - Appreciate GI evaluation.  Advance to clears -Continue pantoprazole  twice daily - If further bleeding will need IR embolization of GDA  AKI -related to hypotension, expect to improve with volume Follow  renal  function Hypokalemia will be repleted  Acute on chronic hepatic failure, suspect alcoholic hepatitis superimposed on cirrhosis - Remains on prednisolone  - Completed n-acetylcysteine , Tylenol  was negative  Hepatic encephalopathy - Lactulose  30 3 times daily  Hepatic coagulopathy -Vitamin K daily x 3 - FFP vs cryo  to correct INR, goal less than 1.5 if possible  Thrombocytopenia due to cirrhosis, alcohol use - Following CBC - In setting of active bleeding platelet goal > 50k  Pancreatitis, suspect alcohol induced - No reported evidence of obstruction in the biliary system and lipase clearing -Creon  ordered  Severe protein calorie malnutrition - Start orals  Best Practice (right click and "Reselect all SmartList Selections" daily)   Diet/type: clear liq DVT prophylaxis SCD Pressure ulcer(s): N/A GI prophylaxis: PPI Lines: N/A Foley:  N/A Code Status:  full code Last date of multidisciplinary goals of care discussion [pending] Updated brother at bedside  Labs   CBC: Recent Labs  Lab 08/12/23 0423 08/13/23 0755 08/14/23 0055 08/14/23 1455 08/15/23 0519 08/15/23 0903 08/15/23 1311 08/15/23 1809 08/16/23 0335  WBC 21.5* 18.2* 18.6*  --  15.8*  --   --   --  18.7*  NEUTROABS  --   --   --   --   --   --   --   --  15.3*  HGB 8.8* 8.0* 6.4*   < > 8.1* 8.4* 8.1* 8.1* 8.2*  HCT 26.0* 24.3* 19.2*   < > 23.5* 25.0* 23.2* 23.5* 23.6*  MCV 92.5 94.2 92.8  --  84.5  --   --   --  84.3  PLT 72* 70* 77*  --  98*  --   --   --  51*   < > = values in this interval not displayed.    Basic Metabolic Panel: Recent Labs  Lab 08/12/23 0423 08/13/23 0755 08/14/23 0055 08/15/23 0519 08/16/23 0335  NA 137 144 145 139 144  K 3.2* 3.4* 3.7 3.3* 3.2*  CL 105 113* 116* 105 114*  CO2 20* 21* 19* 14* 21*  GLUCOSE 159* 154* 145* 235* 100*  BUN 41* 36* 40* 42* 46*  CREATININE 0.78 0.58* 0.91 1.45* 1.31*  CALCIUM 8.1* 8.8* 8.7* 7.2* 8.7*  MG  --   --  1.9 1.6* 2.2  PHOS  --    --  1.9* 9.8* 3.8   GFR: Estimated Creatinine Clearance: 40.5 mL/min (A) (by C-G formula based on SCr of 1.31 mg/dL (H)). Recent Labs  Lab 08/13/23 0755 08/14/23 0055 08/15/23 0519 08/16/23 0335  WBC 18.2* 18.6* 15.8* 18.7*    Liver Function Tests: Recent Labs  Lab 08/12/23 0423 08/13/23 0755 08/14/23 0055 08/15/23 0519 08/16/23 0335  AST 129* 144* 139* 309* 263*  ALT 63* 78* 79* 128* 150*  ALKPHOS 57 61 55 31* 50  BILITOT 17.4* 18.7* 16.8* 15.5* 19.2*  PROT 6.4* 6.3* 5.5* 4.3* 5.6*  ALBUMIN  2.3* 2.1* 1.8* 2.2* 2.5*   Recent Labs  Lab 08/10/23 0537 08/12/23 0423 08/15/23 0519  LIPASE 679* 354* 117*   Recent Labs  Lab 08/15/23 0519  AMMONIA 63*    ABG    Component Value Date/Time   PHART 7.5 (H) 08/08/2023 2100   PCO2ART 25 (L) 08/08/2023 2100   PO2ART 305 (H) 08/08/2023 2100   HCO3 19.5 (L) 08/08/2023 2100   TCO2 31 03/20/2007 1527   ACIDBASEDEF 2.1 (H) 08/08/2023 2100   O2SAT 99.9 08/08/2023 2100     Coagulation Profile: Recent Labs  Lab 08/10/23  1610 08/11/23 0534 08/14/23 0055 08/15/23 0519 08/16/23 0340  INR 2.8* 2.2* 2.2* 2.4* 1.9*    Cardiac Enzymes: No results for input(s): "CKTOTAL", "CKMB", "CKMBINDEX", "TROPONINI" in the last 168 hours.   HbA1C: Hgb A1c MFr Bld  Date/Time Value Ref Range Status  08/10/2023 09:56 PM 4.7 (L) 4.8 - 5.6 % Final    Comment:    (NOTE) Pre diabetes:          5.7%-6.4%  Diabetes:              >6.4%  Glycemic control for   <7.0% adults with diabetes   08/08/2023 04:10 PM QNSTST 4.8 - 5.6 % Final    Comment:    (NOTE) Test not performed. Insufficient specimen to perform or complete analysis. Montie Apley notified 08/10/2023-[place]         Prediabetes: 5.7 - 6.4         Diabetes: >6.4         Glycemic control for adults with diabetes: <7.0     CBG: Recent Labs  Lab 08/14/23 1623 08/15/23 0746 08/15/23 1233 08/15/23 1602 08/16/23 0758  GLUCAP 197* 122* 114* 113* 97   Critical care  time: 32 min     Lael Wetherbee V. Villa Greaser MD 08/16/2023, 8:53 AM [facility] [phone] or if no answer before 7:00PM call [phone] For any issues after 7:00PM please call eLink [phone]

## 2023-08-17 ENCOUNTER — Encounter (HOSPITAL_COMMUNITY): Payer: Self-pay | Admitting: Gastroenterology

## 2023-08-17 DIAGNOSIS — D62 Acute posthemorrhagic anemia: Secondary | ICD-10-CM | POA: Diagnosis not present

## 2023-08-17 DIAGNOSIS — K7200 Acute and subacute hepatic failure without coma: Secondary | ICD-10-CM | POA: Diagnosis not present

## 2023-08-17 DIAGNOSIS — K922 Gastrointestinal hemorrhage, unspecified: Secondary | ICD-10-CM | POA: Diagnosis not present

## 2023-08-17 DIAGNOSIS — R571 Hypovolemic shock: Secondary | ICD-10-CM | POA: Diagnosis not present

## 2023-08-17 LAB — CBC WITH DIFFERENTIAL/PLATELET
Abs Immature Granulocytes: 0.08 10*3/uL — ABNORMAL HIGH (ref 0.00–0.07)
Basophils Absolute: 0 10*3/uL (ref 0.0–0.1)
Basophils Relative: 0 %
Eosinophils Absolute: 0 10*3/uL (ref 0.0–0.5)
Eosinophils Relative: 0 %
HCT: 23.5 % — ABNORMAL LOW (ref 39.0–52.0)
Hemoglobin: 7.9 g/dL — ABNORMAL LOW (ref 13.0–17.0)
Immature Granulocytes: 1 %
Lymphocytes Relative: 8 %
Lymphs Abs: 1.3 10*3/uL (ref 0.7–4.0)
MCH: 29.3 pg (ref 26.0–34.0)
MCHC: 33.6 g/dL (ref 30.0–36.0)
MCV: 87 fL (ref 80.0–100.0)
Monocytes Absolute: 1.2 10*3/uL — ABNORMAL HIGH (ref 0.1–1.0)
Monocytes Relative: 7 %
Neutro Abs: 14.7 10*3/uL — ABNORMAL HIGH (ref 1.7–7.7)
Neutrophils Relative %: 84 %
Platelets: 95 10*3/uL — ABNORMAL LOW (ref 150–400)
RBC: 2.7 MIL/uL — ABNORMAL LOW (ref 4.22–5.81)
RDW: 19.8 % — ABNORMAL HIGH (ref 11.5–15.5)
WBC: 17.4 10*3/uL — ABNORMAL HIGH (ref 4.0–10.5)
nRBC: 0.2 % (ref 0.0–0.2)

## 2023-08-17 LAB — PREPARE FRESH FROZEN PLASMA
Unit division: 0
Unit division: 0

## 2023-08-17 LAB — COMPREHENSIVE METABOLIC PANEL WITH GFR
ALT: 118 U/L — ABNORMAL HIGH (ref 0–44)
AST: 155 U/L — ABNORMAL HIGH (ref 15–41)
Albumin: 2.4 g/dL — ABNORMAL LOW (ref 3.5–5.0)
Alkaline Phosphatase: 63 U/L (ref 38–126)
Anion gap: 11 (ref 5–15)
BUN: 40 mg/dL — ABNORMAL HIGH (ref 8–23)
CO2: 19 mmol/L — ABNORMAL LOW (ref 22–32)
Calcium: 8.9 mg/dL (ref 8.9–10.3)
Chloride: 111 mmol/L (ref 98–111)
Creatinine, Ser: 0.8 mg/dL (ref 0.61–1.24)
GFR, Estimated: >60 mL/min (ref >60)
Glucose, Bld: 145 mg/dL — ABNORMAL HIGH (ref 70–99)
Potassium: 3.9 mmol/L (ref 3.5–5.1)
Sodium: 141 mmol/L (ref 135–145)
Total Bilirubin: 19.4 mg/dL (ref 0.0–1.2)
Total Protein: 5.8 g/dL — ABNORMAL LOW (ref 6.5–8.1)

## 2023-08-17 LAB — GLUCOSE, CAPILLARY
Glucose-Capillary: 136 mg/dL — ABNORMAL HIGH (ref 70–99)
Glucose-Capillary: 147 mg/dL — ABNORMAL HIGH (ref 70–99)
Glucose-Capillary: 143 mg/dL — ABNORMAL HIGH (ref 70–99)

## 2023-08-17 LAB — PREPARE RBC (CROSSMATCH)

## 2023-08-17 LAB — HEMOGLOBIN AND HEMATOCRIT, BLOOD
HCT: 23.1 % — ABNORMAL LOW (ref 39.0–52.0)
HCT: 22 % — ABNORMAL LOW (ref 39.0–52.0)
Hemoglobin: 7.8 g/dL — ABNORMAL LOW (ref 13.0–17.0)
Hemoglobin: 7.4 g/dL — ABNORMAL LOW (ref 13.0–17.0)

## 2023-08-17 LAB — BPAM FFP
Blood Product Expiration Date: 202505042359
Blood Product Expiration Date: 202505042359
ISSUE DATE / TIME: 202504291229
ISSUE DATE / TIME: 202504291503
Unit Type and Rh: 5100
Unit Type and Rh: 5100

## 2023-08-17 LAB — PHOSPHORUS: Phosphorus: 3.8 mg/dL (ref 2.5–4.6)

## 2023-08-17 LAB — MAGNESIUM: Magnesium: 2 mg/dL (ref 1.7–2.4)

## 2023-08-17 LAB — PROTIME-INR
INR: 1.8 — ABNORMAL HIGH (ref 0.8–1.2)
Prothrombin Time: 21.4 s — ABNORMAL HIGH (ref 11.4–15.2)

## 2023-08-17 LAB — FIBRINOGEN: Fibrinogen: 278 mg/dL (ref 210–475)

## 2023-08-17 MED ORDER — SODIUM CHLORIDE 0.9% IV SOLUTION: Freq: Once | INTRAVENOUS | Status: AC

## 2023-08-17 MED ORDER — LACTULOSE 10 GM/15ML PO SOLN
20.0000 g | Freq: Two times a day (BID) | ORAL | Status: DC
  Administered 2023-08-17 – 2023-08-20 (×6): 20 g via ORAL
  Filled 2023-08-17 (×7): qty 30

## 2023-08-17 MED ORDER — OXYCODONE HCL 5 MG PO TABS: 5.0000 mg | ORAL_TABLET | ORAL | Status: DC | PRN

## 2023-08-17 MED ORDER — STERILE WATER FOR INJECTION IJ SOLN
INTRAMUSCULAR | Status: AC
Start: 2023-08-17 — End: 2023-08-17
  Administered 2023-08-17: 10 mL
  Filled 2023-08-17: qty 10

## 2023-08-17 MED ORDER — SODIUM CHLORIDE 0.9 % IV SOLN
1.0000 g | INTRAVENOUS | Status: DC
  Administered 2023-08-17 – 2023-08-19 (×3): 1 g via INTRAVENOUS
  Filled 2023-08-17 (×3): qty 10

## 2023-08-17 NOTE — Evaluation (Signed)
 Clinical/Bedside Swallow Evaluation Patient Details  Name: Kevin Velez MRN: [id number] Date of Birth: Jun 03, 1950  Today's Date: 08/17/2023 Time: SLP Start Time (ACUTE ONLY): 0940 SLP Stop Time (ACUTE ONLY): 1000 SLP Time Calculation (min) (ACUTE ONLY): 20 min  Past Medical History:  Past Medical History:  Diagnosis Date   Diabetes mellitus without complication [facility])    Past Surgical History:  Past Surgical History:  Procedure Laterality Date   ESOPHAGOGASTRODUODENOSCOPY N/A 08/14/2023   Procedure: EGD (ESOPHAGOGASTRODUODENOSCOPY);  Surgeon: Felecia Hopper, MD;  Location: [facility];  Service: Gastroenterology;  Laterality: N/A;   HOT HEMOSTASIS N/A 08/14/2023   Procedure: EGD, WITH ARGON PLASMA COAGULATION/GOLD PROBE;  Surgeon: Felecia Hopper, MD;  Location: [facility] ENDOSCOPY;  Service: Gastroenterology;  Laterality: N/A;   SCLEROTHERAPY  08/14/2023   Procedure: SCLEROTHERAPY;  Surgeon: Felecia Hopper, MD;  Location: [facility] ENDOSCOPY;  Service: Gastroenterology;;   HPI:  73 year old man with history of alcohol use, clear PMH but presumed underlying cirrhosis, admitted from group home 4/21 with acute encephalopathy, weakness, shock.  Found to have fulminant acute hepatic failure with coagulopathy, renal failure, profound anemia.  Required intubation mechanical ventilation,  Extubated on 4/23, started midodrine  4/24 and pressors weaned to off.  Has continued to have dark stools. 4/27 back to the ICU with hematochezia and shock. 4/27 EGD >> large duodenal ulcer, visible vessel with active bleeding treated with epinephrine injection, cautery, small nonbleeding esophageal varices. 4/28 More bleeding overnight. GI advanced diet from clears to regular.    Assessment / Plan / Recommendation  Clinical Impression  RN reports concern with pt coughing when drinking, struggling with pills with water and taking carafate. SLP observed hypophonic strained vocal quality. Pt alert and able to f/c but  percieved to be significantly weak and deconditioned. Trials of thin liquids throughout session resulted in no coughing. Pt also tolerated applesauce and with effort, masticated a graham cracker. Given pts frailty and RN observation an objective study would be warranted. However, GI arrived during session and when asked stated it would be best to avoid barium so that any urgent imaging of GI tract would be clear. For now, advised aspriation precautions with dys 2/thin liquid diet. Pills can be attempted whole in puree. If pt coughs with thin liquids, ok to add thickener for nectar thick until SLP can reassess. SLP Visit Diagnosis: Dysphagia, unspecified (R13.10)    Aspiration Risk  Mild aspiration risk;Risk for inadequate nutrition/hydration    Diet Recommendation Dysphagia 2 (Fine chop);Thin liquid    Liquid Administration via: Cup;Straw Medication Administration: Whole meds with puree Supervision: Staff to assist with self feeding Compensations: Slow rate;Small sips/bites Postural Changes: Seated upright at 90 degrees    Other  Recommendations Oral Care Recommendations: Oral care BID    Recommendations for follow up therapy are one component of a multi-disciplinary discharge planning process, led by the attending physician.  Recommendations may be updated based on patient status, additional functional criteria and insurance authorization.  Follow up Recommendations Skilled nursing-short term rehab (<3 hours/day)      Assistance Recommended at Discharge    Functional Status Assessment    Frequency and Duration min 2x/week  2 weeks       Prognosis Prognosis for improved oropharyngeal function: Fair Barriers to Reach Goals: Severity of deficits      Swallow Study   General HPI: 73 year old man with history of alcohol use, clear PMH but presumed underlying cirrhosis, admitted from group home 4/21 with acute encephalopathy, weakness, shock.  Found to have fulminant  acute hepatic  failure with coagulopathy, renal failure, profound anemia.  Required intubation mechanical ventilation,  Extubated on 4/23, started midodrine  4/24 and pressors weaned to off.  Has continued to have dark stools. 4/27 back to the ICU with hematochezia and shock. 4/27 EGD >> large duodenal ulcer, visible vessel with active bleeding treated with epinephrine injection, cautery, small nonbleeding esophageal varices. 4/28 More bleeding overnight. GI advanced diet from clears to regular. Type of Study: Bedside Swallow Evaluation Previous Swallow Assessment: none Diet Prior to this Study: Regular;Thin liquids (Level 0) Temperature Spikes Noted: No Respiratory Status: Room air History of Recent Intubation: Yes Total duration of intubation (days): 3 days Date extubated: 08/10/23 Behavior/Cognition: Alert;Cooperative Oral Cavity Assessment: Dry Oral Care Completed by SLP: Recent completion by staff Oral Cavity - Dentition: Adequate natural dentition Vision: Functional for self-feeding Self-Feeding Abilities: Needs assist Patient Positioning: Upright in bed Baseline Vocal Quality: Hoarse;Breathy;Low vocal intensity Volitional Cough: Weak    Oral/Motor/Sensory Function     Ice Chips Ice chips: Impaired Presentation: Spoon Pharyngeal Phase Impairments: Throat Clearing - Immediate   Thin Liquid Thin Liquid: Within functional limits Presentation: Straw    Nectar Thick Nectar Thick Liquid: Within functional limits   Honey Thick Honey Thick Liquid: Not tested   Puree Puree: Within functional limits   Solid     Solid: Within functional limits      Kevin Velez, Kevin Velez 08/17/2023,11:14 AM

## 2023-08-17 NOTE — Progress Notes (Signed)
 Attempted to give pt pills whole in applesauce per SLP recommendation. Pt rolled the applesauce around and around before attempting to swallow. After 5 bites of applesauce (all with delayed swallow attempts) and half a cup of water, pt still had not gotten the pills down. He eventually got the prednisolone  pills down after they had dissolved, but the multivitamin had to be fished out of his mouth and discarded. Pt indicated to me that he had swallowed his last bite of applesauce. However, after leaving the room and then reentering a few minutes later, pt asked for the yankeur and spit retained applesauce in it. Dr. Villa Greaser notified and further attempts held at this time.

## 2023-08-17 NOTE — Progress Notes (Signed)
 OT Cancellation Note  Patient Details Name: Kevin Velez MRN: [id number] DOB: 23-Sep-1950   Cancelled Treatment:    Reason Eval/Treat Not Completed: Fatigue/lethargy limiting ability to participate Patient reporting he wanted to rest more this AM and would possibly get up later. OT to continue to follow and check back as schedule will allow.  Wynette Heckler, MS Acute Rehabilitation Department Office# [phone] 08/17/2023, 8:36 AM

## 2023-08-17 NOTE — Progress Notes (Signed)
 NAME:  Kevin Velez, MRN:  [id number], DOB:  Mar 24, 1951, LOS: 9 ADMISSION DATE:  08/08/2023, CONSULTATION DATE: 4/27 REFERRING MD: Dr. Elsworth Halt, CHIEF COMPLAINT: Shock  History of Present Illness:  73 year old man with history of alcohol use, clear PMH but presumed underlying cirrhosis, admitted from group home 4/21 with acute encephalopathy, weakness, shock.  Found to have fulminant acute hepatic failure with coagulopathy, renal failure, profound anemia.  Required intubation mechanical ventilation, treated with n-acetylcysteine  (Tylenol  level negative), prednisolone  possible alcoholic hepatitis, Zosyn  for possible infectious cause including biliary obstruction, lactulose .  Volume resuscitation and PRBC resuscitation initiated. Diagnostic paracentesis done 4/23 > negative.  Extubated on 4/23, started midodrine  4/24 and pressors weaned to off.  Has continued to have dark stools.  Back to stepdown unit on 4/27 due to evolving hypotension, persistent anemia with maroon stool and clots, hemoglobin 8.8 > 8.0 > 6.4 on 4/27.  Started on norepinephrine  and uptitrating, currently at 10.  Just received 1 unit PRBC and 2 more are ordered.  PCCM consulted given his recurrent shock.   Pertinent  Medical History  Unclear PMH, history of cirrhosis, question due to alcohol use  Significant Hospital Events: Including procedures, antibiotic start and stop dates in addition to other pertinent events   4/27 back to the ICU with hematochezia and shock 4/27 EGD >> large duodenal ulcer, visible vessel with active bleeding treated with epinephrine injection, cautery, small nonbleeding esophageal varices 4/28 More bleeding overnight requiring 2 units PRBC, platelets and FFP. Hemoglobin dropped to 5.6 and improved to 8.1 after 2 units.  Interim History / Subjective:   Remains on low-dose 1-2 mics Levophed  1 episode of black/red stool    Objective   Blood pressure (!) 96/59, pulse 78, temperature 97.6 F (36.4 C),  temperature source Axillary, resp. rate 17, height 5\' 9"  (1.753 m), weight 55.3 kg, SpO2 96%.        Intake/Output Summary (Last 24 hours) at 08/17/2023 0849 Last data filed at 08/17/2023 0631 Gross per 24 hour  Intake 1078.85 ml  Output 440 ml  Net 638.85 ml   Filed Weights   08/11/23 0500 08/12/23 0100 08/12/23 0238  Weight: 53.1 kg 55 kg 55.3 kg    Examination: General: Ill-appearing man, laying in bed in no distress HENT: Scleral icterus,  moist mucosa Lungs: No accessory muscle use, clear breath sounds bilateral Cardiovascular: Regular, no murmur Abdomen: Soft, nontender, no hepatosplenomegaly no guarding Extremities: No significant edema Neuro: Awake, interactive, nonfocal  Labs show hemoglobin 7.8, normal electrolytes, decreasing LFTs, bilirubin stable 19, mild thrombocytopenia improving, persistent leukocytosis with left shift  Resolved Hospital Problem list   Acute renal failure Acute respiratory failure requiring mechanical ventilation  Assessment & Plan:   Hemorrhagic shock.  Has been treated since presentation for possible component sepsis,, no clear source but considered biliary obstruction, ultrasound reassuring.  Cultures negative Acute blood loss anemia - Echocardiogram 4/22 with intact LV function - follow serial CBC, decrease frequency to every 8 transfuse for hemoglobin less than 7 -Norepinephrine  can be dc'd - Completed Zosynx 7 days - Continue midodrine   Acute GI bleeding, bleeding duodenal ulcer with visible vessel Repeat CTA abdomen 4/28 does not show further bleeding - Appreciate GI evaluation.  Advance to regular, still has melena but hemoglobin stable -Continue pantoprazole  twice daily -Added sucralfate - If further bleeding will need IR embolization of GDA  AKI -related to hypotension, improved with volume Needs diuresis once off pressors   Acute on chronic hepatic failure, suspect alcoholic hepatitis superimposed  on cirrhosis - Remains on  prednisolone  - Completed n-acetylcysteine , Tylenol  was negative  Hepatic encephalopathy - Lactulose  decreased to 20 twice daily  Hepatic coagulopathy - Received Vitamin K daily x 3 - FFP to correct INR, goal less than 1.5 if possible  Thrombocytopenia due to cirrhosis, alcohol use - Following CBC - In setting of active bleeding platelet goal > 50k  Pancreatitis, suspect alcohol induced - No reported evidence of obstruction in the biliary system and lipase clearing -Creon  ordered 4/24 , no indication, will DC  Severe protein calorie malnutrition - advance orals  Best Practice (right click and "Reselect all SmartList Selections" daily)   Diet/type: clear liq DVT prophylaxis SCD Pressure ulcer(s): N/A GI prophylaxis: PPI Lines: N/A Foley:  N/A Code Status:  full code Last date of multidisciplinary goals of care discussion [pending] Updated pt brother at bedside  Labs   CBC: Recent Labs  Lab 08/13/23 0755 08/14/23 0055 08/14/23 1455 08/15/23 0519 08/15/23 0903 08/16/23 0335 08/16/23 1136 08/16/23 2019 08/17/23 0155 08/17/23 0744  WBC 18.2* 18.6*  --  15.8*  --  18.7*  --   --   --  17.4*  NEUTROABS  --   --   --   --   --  15.3*  --   --   --  14.7*  HGB 8.0* 6.4*   < > 8.1*   < > 8.2* 8.8* 7.7* 7.8* 7.9*  HCT 24.3* 19.2*   < > 23.5*   < > 23.6* 26.4* 22.5* 23.1* 23.5*  MCV 94.2 92.8  --  84.5  --  84.3  --   --   --  87.0  PLT 70* 77*  --  98*  --  51*  --   --   --  95*   < > = values in this interval not displayed.    Basic Metabolic Panel: Recent Labs  Lab 08/13/23 0755 08/14/23 0055 08/15/23 0519 08/16/23 0335 08/17/23 0155  NA 144 145 139 144 141  K 3.4* 3.7 3.3* 3.2* 3.9  CL 113* 116* 105 114* 111  CO2 21* 19* 14* 21* 19*  GLUCOSE 154* 145* 235* 100* 145*  BUN 36* 40* 42* 46* 40*  CREATININE 0.58* 0.91 1.45* 1.31* 0.80  CALCIUM 8.8* 8.7* 7.2* 8.7* 8.9  MG  --  1.9 1.6* 2.2 2.0  PHOS  --  1.9* 9.8* 3.8 3.8   GFR: Estimated Creatinine  Clearance: 66.2 mL/min (by C-G formula based on SCr of 0.8 mg/dL). Recent Labs  Lab 08/14/23 0055 08/15/23 0519 08/16/23 0335 08/17/23 0744  WBC 18.6* 15.8* 18.7* 17.4*    Liver Function Tests: Recent Labs  Lab 08/13/23 0755 08/14/23 0055 08/15/23 0519 08/16/23 0335 08/17/23 0155  AST 144* 139* 309* 263* 155*  ALT 78* 79* 128* 150* 118*  ALKPHOS 61 55 31* 50 63  BILITOT 18.7* 16.8* 15.5* 19.2* 19.4*  PROT 6.3* 5.5* 4.3* 5.6* 5.8*  ALBUMIN  2.1* 1.8* 2.2* 2.5* 2.4*   Recent Labs  Lab 08/12/23 0423 08/15/23 0519  LIPASE 354* 117*   Recent Labs  Lab 08/15/23 0519  AMMONIA 63*    ABG    Component Value Date/Time   PHART 7.5 (H) 08/08/2023 2100   PCO2ART 25 (L) 08/08/2023 2100   PO2ART 305 (H) 08/08/2023 2100   HCO3 19.5 (L) 08/08/2023 2100   TCO2 31 03/20/2007 1527   ACIDBASEDEF 2.1 (H) 08/08/2023 2100   O2SAT 99.9 08/08/2023 2100     Coagulation Profile: Recent Labs  Lab 08/11/23 0534 08/14/23 0055 08/15/23 0519 08/16/23 0340 08/17/23 0744  INR 2.2* 2.2* 2.4* 1.9* 1.8*    Cardiac Enzymes: No results for input(s): "CKTOTAL", "CKMB", "CKMBINDEX", "TROPONINI" in the last 168 hours.   HbA1C: Hgb A1c MFr Bld  Date/Time Value Ref Range Status  08/10/2023 09:56 PM 4.7 (L) 4.8 - 5.6 % Final    Comment:    (NOTE) Pre diabetes:          5.7%-6.4%  Diabetes:              >6.4%  Glycemic control for   <7.0% adults with diabetes   08/08/2023 04:10 PM QNSTST 4.8 - 5.6 % Final    Comment:    (NOTE) Test not performed. Insufficient specimen to perform or complete analysis. Montie Apley notified 08/10/2023-[place]         Prediabetes: 5.7 - 6.4         Diabetes: >6.4         Glycemic control for adults with diabetes: <7.0     CBG: Recent Labs  Lab 08/16/23 0758 08/16/23 1127 08/16/23 1659 08/16/23 2141 08/17/23 0725  GLUCAP 97 108* 125* 134* 136*     Fredonia Casalino V. Villa Greaser MD 08/17/2023, 8:49 AM [facility] if no  answer before 7:00PM call [phone] For any issues after 7:00PM please call eLink [phone]

## 2023-08-17 NOTE — Progress Notes (Signed)
 Subjective: Patient noted to have black/red stool?  Old blood.  Objective: Vital signs in last 24 hours: Temp:  [97.2 F (36.2 C)-98.8 F (37.1 C)] 97.6 F (36.4 C) (04/30 0800) Pulse Rate:  [69-99] 78 (04/30 0800) Resp:  [11-26] 17 (04/30 0800) BP: (76-156)/(47-83) 96/59 (04/30 0800) SpO2:  [93 %-100 %] 96 % (04/30 0800) Weight change:  Last BM Date : 08/16/23  PE: Chronically ill-appearing, prominent pallor GENERAL: Awake, oriented x 3  ABDOMEN: Distended but not tense, bowel sounds audible EXTREMITIES: No deformity  Lab Results: Results for orders placed or performed during the hospital encounter of 08/08/23 (from the past 48 hours)  Prepare cryoprecipitate     Status: None   Collection Time: 08/15/23 10:46 AM  Result Value Ref Range   Unit Number [id number]    Blood Component Type CRYO,THAWED    Unit division 00    Status of Unit ISSUED,FINAL    Transfusion Status      OK TO TRANSFUSE Performed at [facility], [address], [place], [place] [address]   Glucose, capillary     Status: Abnormal   Collection Time: 08/15/23 12:33 PM  Result Value Ref Range   Glucose-Capillary 114 (H) 70 - 99 mg/dL    Comment: Glucose reference range applies only to samples taken after fasting for at least 8 hours.   Comment 1 Notify RN    Comment 2 Document in Chart   Hemoglobin and hematocrit, blood     Status: Abnormal   Collection Time: 08/15/23  1:11 PM  Result Value Ref Range   Hemoglobin 8.1 (L) 13.0 - 17.0 g/dL   HCT 62.9 (L) 52.8 - 41.3 %    Comment: Performed at [facility], [address] [place], [place] [address]  Glucose, capillary     Status: Abnormal   Collection Time: 08/15/23  4:02 PM  Result Value Ref Range   Glucose-Capillary 113 (H) 70 - 99 mg/dL    Comment: Glucose reference range applies only to samples taken after fasting for at least 8 hours.   Comment 1 Notify RN    Comment 2 Document in Chart   Hemoglobin and  hematocrit, blood     Status: Abnormal   Collection Time: 08/15/23  6:09 PM  Result Value Ref Range   Hemoglobin 8.1 (L) 13.0 - 17.0 g/dL   HCT 02.7 (L) 25.3 - 66.4 %    Comment: Performed at [facility], [address], [place], [place] [address]  CBC with Differential/Platelet     Status: Abnormal   Collection Time: 08/16/23  3:35 AM  Result Value Ref Range   WBC 18.7 (H) 4.0 - 10.5 K/uL   RBC 2.80 (L) 4.22 - 5.81 MIL/uL   Hemoglobin 8.2 (L) 13.0 - 17.0 g/dL   HCT 42.5 (L) 95.6 - 38.7 %   MCV 84.3 80.0 - 100.0 fL   MCH 29.3 26.0 - 34.0 pg   MCHC 34.7 30.0 - 36.0 g/dL   RDW 56.4 (H) 33.2 - 95.1 %   Platelets 51 (L) 150 - 400 K/uL    Comment: SPECIMEN CHECKED FOR CLOTS REPEATED TO VERIFY PLATELET COUNT CONFIRMED BY SMEAR    nRBC 0.8 (H) 0.0 - 0.2 %   Neutrophils Relative % 83 %   Neutro Abs 15.3 (H) 1.7 - 7.7 K/uL   Lymphocytes Relative 8 %   Lymphs Abs 1.6 0.7 - 4.0 K/uL   Monocytes Relative 7 %   Monocytes  Absolute 1.4 (H) 0.1 - 1.0 K/uL   Eosinophils Relative 1 %   Eosinophils Absolute 0.3 0.0 - 0.5 K/uL   Basophils Relative 0 %   Basophils Absolute 0.0 0.0 - 0.1 K/uL   Immature Granulocytes 1 %   Abs Immature Granulocytes 0.17 (H) 0.00 - 0.07 K/uL    Comment: Performed at [facility], [address], [place], [place] [address]  Magnesium      Status: None   Collection Time: 08/16/23  3:35 AM  Result Value Ref Range   Magnesium  2.2 1.7 - 2.4 mg/dL    Comment: Performed at [facility], [address] [place], [place] [address]  Phosphorus     Status: None   Collection Time: 08/16/23  3:35 AM  Result Value Ref Range   Phosphorus 3.8 2.5 - 4.6 mg/dL    Comment: ICTERUS AT THIS LEVEL MAY AFFECT RESULT ICTERUS AT THIS LEVEL MAY AFFECT RESULT Performed at [facility], [address], [place], [place] [address]   Comprehensive metabolic panel     Status: Abnormal   Collection Time: 08/16/23   3:35 AM  Result Value Ref Range   Sodium 144 135 - 145 mmol/L   Potassium 3.2 (L) 3.5 - 5.1 mmol/L   Chloride 114 (H) 98 - 111 mmol/L   CO2 21 (L) 22 - 32 mmol/L   Glucose, Bld 100 (H) 70 - 99 mg/dL    Comment: Glucose reference range applies only to samples taken after fasting for at least 8 hours.   BUN 46 (H) 8 - 23 mg/dL   Creatinine, Ser 9.14 (H) 0.61 - 1.24 mg/dL   Calcium 8.7 (L) 8.9 - 10.3 mg/dL   Total Protein 5.6 (L) 6.5 - 8.1 g/dL   Albumin  2.5 (L) 3.5 - 5.0 g/dL   AST 782 (H) 15 - 41 U/L   ALT 150 (H) 0 - 44 U/L   Alkaline Phosphatase 50 38 - 126 U/L   Total Bilirubin 19.2 (HH) 0.0 - 1.2 mg/dL    Comment: CRITICAL RESULT CALLED TO, READ BACK BY AND VERIFIED WITH S. WILLIS, RN 08/16/23 0409 BY K. DAVIS   GFR, Estimated 58 (L) >60 mL/min    Comment: (NOTE) Calculated using the CKD-EPI Creatinine Equation (2021)    Anion gap 9 5 - 15    Comment: Performed at [facility], [address], [place], [place] [address]  Protime-INR     Status: Abnormal   Collection Time: 08/16/23  3:40 AM  Result Value Ref Range   Prothrombin Time 22.3 (H) 11.4 - 15.2 seconds   INR 1.9 (H) 0.8 - 1.2    Comment: (NOTE) INR goal varies based on device and disease states. Performed at [facility], [address] [place], [place] [address]   Glucose, capillary     Status: None   Collection Time: 08/16/23  7:58 AM  Result Value Ref Range   Glucose-Capillary 97 70 - 99 mg/dL    Comment: Glucose reference range applies only to samples taken after fasting for at least 8 hours.  Fibrinogen     Status: None   Collection Time: 08/16/23  9:02 AM  Result Value Ref Range   Fibrinogen 274 210 - 475 mg/dL    Comment: (NOTE) Fibrinogen results may be underestimated in patients receiving thrombolytic therapy. Performed at [facility], [address], [place], [place] [address]   Glucose, capillary     Status:  Abnormal   Collection Time:  08/16/23 11:27 AM  Result Value Ref Range   Glucose-Capillary 108 (H) 70 - 99 mg/dL    Comment: Glucose reference range applies only to samples taken after fasting for at least 8 hours.  Prepare fresh frozen plasma     Status: None   Collection Time: 08/16/23 11:28 AM  Result Value Ref Range   Unit Number [id number]    Blood Component Type THW PLS APHR    Unit division 00    Status of Unit ISSUED,FINAL    Transfusion Status      OK TO TRANSFUSE Performed at [facility], [address] [place], [place] [address]    Unit Number [id number]    Blood Component Type THW PLS APHR    Unit division 00    Status of Unit ISSUED,FINAL    Transfusion Status OK TO TRANSFUSE   Hemoglobin and hematocrit, blood     Status: Abnormal   Collection Time: 08/16/23 11:36 AM  Result Value Ref Range   Hemoglobin 8.8 (L) 13.0 - 17.0 g/dL   HCT 78.4 (L) 69.6 - 29.5 %    Comment: Performed at [facility], [address], [place], [place] [address]  Glucose, capillary     Status: Abnormal   Collection Time: 08/16/23  4:59 PM  Result Value Ref Range   Glucose-Capillary 125 (H) 70 - 99 mg/dL    Comment: Glucose reference range applies only to samples taken after fasting for at least 8 hours.  Hemoglobin and hematocrit, blood     Status: Abnormal   Collection Time: 08/16/23  8:19 PM  Result Value Ref Range   Hemoglobin 7.7 (L) 13.0 - 17.0 g/dL   HCT 24.4 (L) 01.0 - 27.2 %    Comment: Performed at [facility], [address] [place], [place] [address]  Glucose, capillary     Status: Abnormal   Collection Time: 08/16/23  9:41 PM  Result Value Ref Range   Glucose-Capillary 134 (H) 70 - 99 mg/dL    Comment: Glucose reference range applies only to samples taken after fasting for at least 8 hours.   Comment 1 Notify RN    Comment 2 Document in Chart   Hemoglobin and hematocrit, blood     Status: Abnormal   Collection Time: 08/17/23  1:55 AM   Result Value Ref Range   Hemoglobin 7.8 (L) 13.0 - 17.0 g/dL   HCT 40.3 (L) 47.4 - 25.9 %    Comment: Performed at [facility], [address] [place], [place] [address]  Magnesium      Status: None   Collection Time: 08/17/23  1:55 AM  Result Value Ref Range   Magnesium  2.0 1.7 - 2.4 mg/dL    Comment: Performed at [facility], [address] [place], [place] [address]  Phosphorus     Status: None   Collection Time: 08/17/23  1:55 AM  Result Value Ref Range   Phosphorus 3.8 2.5 - 4.6 mg/dL    Comment: ICTERUS AT THIS LEVEL MAY AFFECT RESULT Performed at [facility], [address], [place], [place] [address]   Comprehensive metabolic panel     Status: Abnormal   Collection Time: 08/17/23  1:55 AM  Result Value Ref Range   Sodium 141 135 - 145 mmol/L   Potassium 3.9 3.5 - 5.1 mmol/L    Comment: DELTA CHECK NOTED   Chloride 111 98 - 111  mmol/L   CO2 19 (L) 22 - 32 mmol/L   Glucose, Bld 145 (H) 70 - 99 mg/dL    Comment: Glucose reference range applies only to samples taken after fasting for at least 8 hours.   BUN 40 (H) 8 - 23 mg/dL   Creatinine, Ser 1.61 0.61 - 1.24 mg/dL   Calcium 8.9 8.9 - 09.6 mg/dL   Total Protein 5.8 (L) 6.5 - 8.1 g/dL   Albumin  2.4 (L) 3.5 - 5.0 g/dL   AST 045 (H) 15 - 41 U/L   ALT 118 (H) 0 - 44 U/L   Alkaline Phosphatase 63 38 - 126 U/L   Total Bilirubin 19.4 (HH) 0.0 - 1.2 mg/dL    Comment: CRITICAL VALUE NOTED. VALUE IS CONSISTENT WITH PREVIOUSLY REPORTED/CALLED VALUE   GFR, Estimated >60 >60 mL/min    Comment: (NOTE) Calculated using the CKD-EPI Creatinine Equation (2021)    Anion gap 11 5 - 15    Comment: Performed at [facility], [address] [place], [place] [address]  Fibrinogen     Status: None   Collection Time: 08/17/23  1:55 AM  Result Value Ref Range   Fibrinogen 278 210 - 475 mg/dL    Comment: (NOTE) Fibrinogen results may be underestimated in  patients receiving thrombolytic therapy. Performed at [facility], [address], [place], [place] [address]   Glucose, capillary     Status: Abnormal   Collection Time: 08/17/23  7:25 AM  Result Value Ref Range   Glucose-Capillary 136 (H) 70 - 99 mg/dL    Comment: Glucose reference range applies only to samples taken after fasting for at least 8 hours.  CBC with Differential/Platelet     Status: Abnormal   Collection Time: 08/17/23  7:44 AM  Result Value Ref Range   WBC 17.4 (H) 4.0 - 10.5 K/uL   RBC 2.70 (L) 4.22 - 5.81 MIL/uL   Hemoglobin 7.9 (L) 13.0 - 17.0 g/dL   HCT 82.9 (L) 56.2 - 13.0 %   MCV 87.0 80.0 - 100.0 fL   MCH 29.3 26.0 - 34.0 pg   MCHC 33.6 30.0 - 36.0 g/dL   RDW 86.5 (H) 78.4 - 69.6 %   Platelets 95 (L) 150 - 400 K/uL    Comment: SPECIMEN CHECKED FOR CLOTS REPEATED TO VERIFY PLATELET COUNT CONFIRMED BY SMEAR    nRBC 0.2 0.0 - 0.2 %   Neutrophils Relative % 84 %   Neutro Abs 14.7 (H) 1.7 - 7.7 K/uL   Lymphocytes Relative 8 %   Lymphs Abs 1.3 0.7 - 4.0 K/uL   Monocytes Relative 7 %   Monocytes Absolute 1.2 (H) 0.1 - 1.0 K/uL   Eosinophils Relative 0 %   Eosinophils Absolute 0.0 0.0 - 0.5 K/uL   Basophils Relative 0 %   Basophils Absolute 0.0 0.0 - 0.1 K/uL   WBC Morphology MORPHOLOGY UNREMARKABLE    Smear Review MORPHOLOGY UNREMARKABLE    Immature Granulocytes 1 %   Abs Immature Granulocytes 0.08 (H) 0.00 - 0.07 K/uL   Burr Cells PRESENT    Target Cells PRESENT     Comment: Performed at [facility], [address] [place], [place] [address]  Protime-INR     Status: Abnormal   Collection Time: 08/17/23  7:44 AM  Result Value Ref Range   Prothrombin Time 21.4 (H) 11.4 - 15.2 seconds   INR 1.8 (H) 0.8 - 1.2    Comment: (NOTE) INR goal varies  based on device and disease states. Performed at [facility], [address], [place], [place] [address]     Studies/Results: No results  found.  Medications: I have reviewed the patient's current medications.  Assessment: Duodenal ulcer with visible vessel requiring epinephrine/bipolar cautery/Purastat gel placement, multiple units of PRBC, FFP, cryoprecipitate, platelet transfusions and vitamin K x 2  Hemoglobin stable at 7.9(7.8/7.7/8.8/8.2/8.1/8.1) BUN stable at 40  Cirrhotic-T. bili 19.4/AST 155/ALT 118/ALP 63,  on prednisolone  for alcoholic hepatitis since 08/12/2023(for total 28 days),  MELD Na 24 PT/INR 21.4/1.8 platelet 95 Mild ascites, anasarca and pleural effusion Small esophageal varices  IV Zosyn  has been discontinued Hypotensive, on midodrine , very low-dose of IV Levophed .   Plan: Has been started on dysphagia level 2 diet. Continue pantoprazole  40 mg every 12 hours. Continue Carafate 1 g 4 times a day. Recommend IV ceftriaxone while he is inpatient as he is cirrhotic and has ascites and is at risk of SBP.   Genell Ken, MD 08/17/2023, 10:09 AM

## 2023-08-17 NOTE — Progress Notes (Addendum)
 eLink Physician-Brief Progress Note Patient Name: Kevin Velez DOB: 1950/05/22 MRN: [id number]   Date of Service  08/17/2023  HPI/Events of Note  Notified of blood mixed with stool. Had not had a BM during the day. ON 2 mic levophed  unchanged. HR in 90s. BP is ok. RN says it was not fresh red blood but was significant amount mixed with stool. No change in belly exam. Distension is unchanged  and no nausea or vomiting  eICU Interventions  Send AM labs now along with fibrinogen Notes mention possibility of involving IR but CCM note also mentions he had melena earlier night. Not sure if this is some old blood also. Will see what the labs show.       Intervention Category Major Interventions: Hemorrhage - evaluation and management  Kayliah Tindol G Lailani Tool 08/17/2023, 1:58 AM  Addendum at 4 am - Labs are not back yet . H/H stable on last check which were done 4-5 hr apart. Patient in no distress with HR in 80s and mAP of 79. Asked RN to let us  know when resulted.   Addendum at 6:35 am - CMP has resulted. Fibrinogen normal. Full CBC and PT INR still not in system. RN clarified sample was sent. I ordered Q6 h/H to start at 7 am to trend as well.Maps are in 70s while on 2 mic of levo (not changed at all). HR in 80s.

## 2023-08-17 NOTE — Evaluation (Signed)
 Occupational Therapy Evaluation Patient Details Name: Kevin Velez MRN: [id number] DOB: February 08, 1951 Today's Date: 08/17/2023   History of Present Illness   Patient is a 73 year old male who presented on 4/21 with increased lethargy. patient was found to be hypotensive and anemic with hgb of 3. patient was intubated upon admission and extubated on 4/23. WGN:FAOZHYQ use, assumed cirrhosis.     Clinical Impressions Patient is a 73 year old male who was admitted for above. Patient was living at group home prior level with independence in Adls. Currently, patient is +2 to move with patient declining to eat anything. Patient was noted to have decreased functional activity tolernace, decreased ROM, decreased BUE strength, decreased endurance, decreased sitting balance, decreased standing balanced, decreased safety awareness, and decreased knowledge of AE/AD impacting participation in ADLs. Patient will benefit from continued inpatient follow up therapy, <3 hours/day.Patient would benefit from [facility] meeting. Rehab potential is guarded at this time. OT to continue to follow.      If plan is discharge home, recommend the following:   Two people to help with walking and/or transfers;Two people to help with bathing/dressing/bathroom;Assistance with cooking/housework;Direct supervision/assist for medications management;Assist for transportation;Help with stairs or ramp for entrance;Direct supervision/assist for financial management     Functional Status Assessment   Patient has had a recent decline in their functional status and demonstrates the ability to make significant improvements in function in a reasonable and predictable amount of time.     Equipment Recommendations   None recommended by OT     Recommendations for Other Services   Other (comment) (pallative consult)     Precautions/Restrictions   Precautions Precautions: Fall Precaution/Restrictions Comments: incontinent, BP  low Restrictions Weight Bearing Restrictions Per Provider Order: No     Mobility Bed Mobility Overal bed mobility: Needs Assistance Bed Mobility: Supine to Sit     Supine to sit: Mod assist     General bed mobility comments: multimodal cues for initiating  moving les, Assist with trunk         Balance Overall balance assessment: Needs assistance Sitting-balance support: Bilateral upper extremity supported, Feet supported Sitting balance-Leahy Scale: Fair     Standing balance support: During functional activity, Bilateral upper extremity supported, Reliant on assistive device for balance Standing balance-Leahy Scale: Poor         ADL either performed or assessed with clinical judgement   ADL Overall ADL's : Needs assistance/impaired   Eating/Feeding Details (indicate cue type and reason): patient declining to eat Grooming: (P) Sitting;Minimal assistance   Upper Body Bathing: Sitting;Moderate assistance   Lower Body Bathing: Sitting/lateral leans;Maximal assistance   Upper Body Dressing : Sitting;Moderate assistance   Lower Body Dressing: Sitting/lateral leans;Maximal assistance   Toilet Transfer: +2 for physical assistance;+2 for safety/equipment;Minimal assistance;Rolling walker (2 wheels);Stand-pivot Statistician Details (indicate cue type and reason): with increased time. BP 100/61 mmhg after transfer. Toileting- Clothing Manipulation and Hygiene: Bed level;Total assistance               Vision   Vision Assessment?: No apparent visual deficits            Pertinent Vitals/Pain Pain Assessment Pain Assessment: No/denies pain     Extremity/Trunk Assessment Upper Extremity Assessment Upper Extremity Assessment: Generalized weakness       Cervical / Trunk Assessment Cervical / Trunk Assessment: Normal;Other exceptions (cachectic appearance.)   Communication     Cognition Arousal: Alert Behavior During Therapy: Flat affect Cognition:  Difficult to assess  OT - Cognition Comments: formal cognitive testing not able to be conducted as patient had multiple vistors in room. patient did not have recollection of speaking with therapist hours earlier. sister and multiple other family members present in room.                 Following commands: Intact                  Home Living Family/patient expects to be discharged to:: Group home     Type of Home: House Home Access: Stairs to enter [employer] of Steps: "a few"   Home Layout: One level               Home Equipment: None          Prior Functioning/Environment Prior Level of Function : Independent/Modified Independent               ADLs Comments: states he sponge bathes    OT Problem List: Decreased strength;Decreased cognition;Decreased knowledge of precautions;Cardiopulmonary status limiting activity;Decreased range of motion;Decreased safety awareness;Decreased activity tolerance;Decreased coordination;Decreased knowledge of use of DME or AE   OT Treatment/Interventions: Self-care/ADL training;DME and/or AE instruction;Therapeutic activities;Balance training;Therapeutic exercise;Energy conservation;Patient/family education      OT Goals(Current goals can be found in the care plan section)   Acute Rehab OT Goals Patient Stated Goal: none stated OT Goal Formulation: Patient unable to participate in goal setting Time For Goal Achievement: 08/31/23 Potential to Achieve Goals: Fair   OT Frequency:  Min 2X/week       AM-PAC OT "6 Clicks" Daily Activity     Outcome Measure Help from another person eating meals?: A Lot Help from another person taking care of personal grooming?: A Lot Help from another person toileting, which includes using toliet, bedpan, or urinal?: Total Help from another person bathing (including washing, rinsing, drying)?: Total Help from another person to put on and taking off  regular upper body clothing?: Total Help from another person to put on and taking off regular lower body clothing?: Total 6 Click Score: 8   End of Session Equipment Utilized During Treatment: Rolling walker (2 wheels) Nurse Communication: Other (comment) (nurse in room to assist with movement)  Activity Tolerance: Patient tolerated treatment well Patient left: in chair;with call bell/phone within reach;with chair alarm set;with family/visitor present  OT Visit Diagnosis: Unsteadiness on feet (R26.81);Other abnormalities of gait and mobility (R26.89);Muscle weakness (generalized) (M62.81);Adult, failure to thrive (R62.7)                Time: 1610-9604 OT Time Calculation (min): 15 min Charges:  OT General Charges $OT Visit: 1 Visit OT Evaluation $OT Eval Moderate Complexity: 1 Mod  Aundria Bitterman OTR/L, MS Acute Rehabilitation Department Office# [phone]   Jame Maze 08/17/2023, 3:29 PM

## 2023-08-18 DIAGNOSIS — K7200 Acute and subacute hepatic failure without coma: Secondary | ICD-10-CM | POA: Diagnosis not present

## 2023-08-18 LAB — CBC WITH DIFFERENTIAL/PLATELET
Abs Immature Granulocytes: 0.1 10*3/uL — ABNORMAL HIGH (ref 0.00–0.07)
Basophils Absolute: 0 10*3/uL (ref 0.0–0.1)
Basophils Relative: 0 %
Eosinophils Absolute: 0 10*3/uL (ref 0.0–0.5)
Eosinophils Relative: 0 %
HCT: 28.2 % — ABNORMAL LOW (ref 39.0–52.0)
Hemoglobin: 9.2 g/dL — ABNORMAL LOW (ref 13.0–17.0)
Immature Granulocytes: 1 %
Lymphocytes Relative: 5 %
Lymphs Abs: 0.7 10*3/uL (ref 0.7–4.0)
MCH: 29.7 pg (ref 26.0–34.0)
MCHC: 32.6 g/dL (ref 30.0–36.0)
MCV: 91 fL (ref 80.0–100.0)
Monocytes Absolute: 1 10*3/uL (ref 0.1–1.0)
Monocytes Relative: 7 %
Neutro Abs: 13.8 10*3/uL — ABNORMAL HIGH (ref 1.7–7.7)
Neutrophils Relative %: 87 %
Platelets: 111 10*3/uL — ABNORMAL LOW (ref 150–400)
RBC: 3.1 MIL/uL — ABNORMAL LOW (ref 4.22–5.81)
RDW: 19.6 % — ABNORMAL HIGH (ref 11.5–15.5)
WBC: 15.7 10*3/uL — ABNORMAL HIGH (ref 4.0–10.5)
nRBC: 0 % (ref 0.0–0.2)

## 2023-08-18 LAB — TYPE AND SCREEN
ABO/RH(D): O POS
ABO/RH(D): O POS
Antibody Screen: NEGATIVE
Antibody Screen: NEGATIVE
Unit division: 0
Unit division: 0
Unit division: 0
Unit division: 0
Unit division: 0
Unit division: 0
Unit division: 0
Unit division: 0

## 2023-08-18 LAB — PREPARE FRESH FROZEN PLASMA

## 2023-08-18 LAB — BPAM FFP
Blood Product Expiration Date: 202505052359
Blood Product Expiration Date: 202505052359
ISSUE DATE / TIME: 202504301018
ISSUE DATE / TIME: 202504301200
Unit Type and Rh: 5100
Unit Type and Rh: 5100

## 2023-08-18 LAB — BPAM RBC
Blood Product Expiration Date: 202505262359
Blood Product Expiration Date: 202506022359
Blood Product Expiration Date: 202505282359
Blood Product Expiration Date: 202505282359
Blood Product Expiration Date: 202505282359
ISSUE DATE / TIME: 202505010316
ISSUE DATE / TIME: 202505312359
ISSUE DATE / TIME: 202504270522
ISSUE DATE / TIME: 202504271053
ISSUE DATE / TIME: 202504271317
ISSUE DATE / TIME: 202504272055
ISSUE DATE / TIME: 202504272327
ISSUE DATE / TIME: 202504280136
ISSUE DATE / TIME: 202505302359
ISSUE DATE / TIME: 202505302359
PRODUCT CODE: 202504301954
Unit Type and Rh: 202505262359
Unit Type and Rh: 202505262359
Unit Type and Rh: 202505312359
Unit Type and Rh: 5100
Unit Type and Rh: 5100
Unit Type and Rh: 202505302359
Unit Type and Rh: 202505302359
Unit Type and Rh: 202506022359
Unit Type and Rh: 5100
Unit Type and Rh: 5100
Unit Type and Rh: 5100
Unit Type and Rh: 5100
Unit Type and Rh: 5100
Unit Type and Rh: 5100

## 2023-08-18 LAB — GLUCOSE, CAPILLARY
Glucose-Capillary: 135 mg/dL — ABNORMAL HIGH (ref 70–99)
Glucose-Capillary: 120 mg/dL — ABNORMAL HIGH (ref 70–99)
Glucose-Capillary: 133 mg/dL — ABNORMAL HIGH (ref 70–99)

## 2023-08-18 LAB — HEMOGLOBIN AND HEMATOCRIT, BLOOD
HCT: 26.2 % — ABNORMAL LOW (ref 39.0–52.0)
HCT: 27 % — ABNORMAL LOW (ref 39.0–52.0)
Hemoglobin: 9 g/dL — ABNORMAL LOW (ref 13.0–17.0)
Hemoglobin: 9.1 g/dL — ABNORMAL LOW (ref 13.0–17.0)

## 2023-08-18 MED ORDER — ENSURE ENLIVE PO LIQD
237.0000 mL | Freq: Three times a day (TID) | ORAL | Status: DC
  Administered 2023-08-18 – 2023-08-20 (×5): 237 mL via ORAL

## 2023-08-18 MED ORDER — FOLIC ACID 1 MG PO TABS
1.0000 mg | ORAL_TABLET | Freq: Every day | ORAL | Status: DC
Start: 2023-08-18 — End: 2023-08-20
  Administered 2023-08-18 – 2023-08-20 (×3): 1 mg via ORAL
  Filled 2023-08-18 (×3): qty 1

## 2023-08-18 MED ORDER — THIAMINE MONONITRATE 100 MG PO TABS
100.0000 mg | ORAL_TABLET | Freq: Every day | ORAL | Status: DC
  Administered 2023-08-18 – 2023-08-20 (×3): 100 mg via ORAL
  Filled 2023-08-18 (×3): qty 1

## 2023-08-18 NOTE — Progress Notes (Signed)
 Speech Language Pathology Treatment: Dysphagia  Patient Details Name: Kevin Velez MRN: [id number] DOB: 1950-10-21 Today's Date: 08/18/2023 Time: 1045-1100 SLP Time Calculation (min) (ACUTE ONLY): 15 min  Assessment / Plan / Recommendation Clinical Impression  Pt demonstrates improved cognition and attention today. He is upright and conversational. Vocal quality louder, still just a little hoarse. Talked to pt a bit about his typical habits when taking pills. He reports he struggles with large capulses, just rolls those around in his mouth, can't seem to get them down. He doesn't like to take lots of pills at once and occasionally small pills dissolve in his mouth while he's trying to get them down. This is fairly consistent with what the RN saw yesterday, but pt wasn't strong or clear enough to explain. Today pt took a single small pill with lots of water . Then took his steroids which were several small pills. Took him lots of water , but he succeeded. Overall mentation is much better. Pt does want some solid food and states he would prefer very soft foods. Will reattempt dys 2 diet.   HPI HPI: 73 year old man with history of alcohol use, clear PMH but presumed underlying cirrhosis, admitted from group home 4/21 with acute encephalopathy, weakness, shock.  Found to have fulminant acute hepatic failure with coagulopathy, renal failure, profound anemia.  Required intubation mechanical ventilation,  Extubated on 4/23, started midodrine  4/24 and pressors weaned to off.  Has continued to have dark stools. 4/27 back to the ICU with hematochezia and shock. 4/27 EGD >> large duodenal ulcer, visible vessel with active bleeding treated with epinephrine  injection, cautery, small nonbleeding esophageal varices. 4/28 More bleeding overnight. GI advanced diet from clears to regular.      SLP Plan  Continue with current plan of care      Recommendations for follow up therapy are one component of a  multi-disciplinary discharge planning process, led by the attending physician.  Recommendations may be updated based on patient status, additional functional criteria and insurance authorization.    Recommendations  Diet recommendations: Thin liquid;Dysphagia 2 (fine chop) Liquids provided via: Cup;Straw Medication Administration: Whole meds with liquid Supervision: Patient able to self feed Compensations: Follow solids with liquid                  Oral care BID     Dysphagia, unspecified (R13.10)     Continue with current plan of care     Kevin Velez, Kevin Velez  08/18/2023, 10:56 AM

## 2023-08-18 NOTE — Progress Notes (Signed)
 Nutrition Follow-up  DOCUMENTATION CODES:   Severe malnutrition in context of chronic illness  INTERVENTION:  - DYS 2 diet per SLP. - Ensure Plus High Protein po BID, each supplement provides 350 kcal and 20 grams of protein. - Encourage intake as tolerated.  - Multivitamin with minerals daily - Monitor weight trends.   NUTRITION DIAGNOSIS:   Severe Malnutrition related to chronic illness as evidenced by severe fat depletion, severe muscle depletion. *ongoing  GOAL:   Patient will meet greater than or equal to 90% of their needs *not met  MONITOR:   Vent status, Labs, Weight trends  REASON FOR ASSESSMENT:   Ventilator    ASSESSMENT:   73 y.o. male with unknown PMH who lives in a group home, presented after several days of worsening lethargy, inability go to bed, weakness. Admitted for live failure, encephalopathy, and severe sepsis.  4/21 Admit; Intubated  4/23 Extubated 4/24 Regular diet 4/27 EGD; NPO 4/29 CLD 4/30 Regular diet -> DYS 2 diet after SLP eval -> CLD after patient had trouble swallowing pills  Patient working with SLP at time of visit.  Patient noted to have had an SLP eval yesterday and advanced to a DYS 2 diet. However, had trouble swallowing pills afterwards so downgraded to clear liquids.  No meal intakes documented since 4/26 to determine intake over the past couple days. Had been ordered [facility] but note to not be consuming.  Diet now advanced back to DYS 2 and per SLP note today, patient with improved cognition and attention. Will order Ensure now that diet advanced.     Admit weight: 113# Current weight: 122# I&O's: +15.3L since admit   Medications reviewed and include: Lactulose  BID, MVI   Labs reviewed:  -   Diet Order:   Diet Order             DIET DYS 2 Room service appropriate? Yes; Fluid consistency: Thin  Diet effective now                   EDUCATION NEEDS:  No education needs have been identified at this  time  Skin:  Skin Assessment: Reviewed RN Assessment  Last BM:  4/30 - type 6  Height:  Ht Readings from Last 1 Encounters:  08/08/23 5\' 9"  (1.753 m)   Weight:  Wt Readings from Last 1 Encounters:  08/12/23 55.3 kg   Ideal Body Weight:  72.73 kg  BMI:  Body mass index is 18.02 kg/m.  Estimated Nutritional Needs:  Kcal:  1450-1650 kcals Protein:  75-100 grams Fluid:  >/= 1.5L    Scheryl Cushing RD, LDN Contact via Secure Chat.

## 2023-08-18 NOTE — Progress Notes (Signed)
 PROGRESS NOTE    Kevin Velez  GNF:[id number] DOB: [birth year] DOA: 08/08/2023 PCP: Patient, No Pcp Per   Brief Narrative:  73 years old male with unknown past medical history who lives in a group home presented in the ED with several days history of worsening confusion, inability to get out of bed, weakness.  On presentation, he was encephalopathic with hemoglobin of 3, 5 and acute hepatic failure with elevated INR and LFTs with AKI.  He was intubated in the ED and admitted to ICU under PCCM service.  GI was consulted.  He was found to have cirrhosis of liver on ultrasound.  He was initially treated with NAC infusion which was subsequently discontinued.  He was also started on prednisolone  for possible asthmatic hepatitis and Zosyn  for possible infectious cause including biliary obstruction.  He received packed red cells transfusion.  He was extubated on 08/10/2023.  Had diagnostic paracentesis done on 08/10/2023 which was negative for SBP.  He was transferred back to ICU with hematochezia and shock on 08/14/2023.  He had EGD on 08/14/2023 which showed large duodenal ulcer, visible vessel with active bleeding treated with epinephrine  injection, cautery, small nonbleeding esophageal varices.  He required 2 more units of packed red cells along with platelets and FFP on 08/15/2023 for further bleeding.  He needed Levophed  for pressor support as well.  Subsequently, Levophed  has been discontinued.  He has been transferred back to [facility] service from 08/18/2023 onwards.  PT recommending SNF placement.  Assessment & Plan:   Hemorrhagic shock Acute blood loss anemia -Patient has required multiple units of packed red cell transfusion as well as platelets, cryoprecipitate and FFP transfusion as well as vitamin K during this hospitalization. - There was a concern for underlying infection causing shock and he has completed 7-day course of Zosyn  although cultures have been negative so far and no source was identified.   Echo showed intact LV function -Blood pressure on the lower side but stable.  Continue midodrine .  Currently on Rocephin  for SBP prophylaxis. - Hemoglobin 9.1 this morning.  Continue to monitor H&H.  Last blood transfusion was on 08/15/2023.  Acute GI bleeding, bleeding duodenal ulcer with visible vessel - Had EGD on 08/14/2023 which showed showed large duodenal ulcer, visible vessel with active bleeding treated with epinephrine  injection, cautery, small nonbleeding esophageal varices.  Continued to have bleeding afterwards with melena.  Repeat CTA of abdomen on 08/15/2023 showed no further bleeding. - No bleeding reported overnight.  GI following.  Continue PPI and Carafate .  Cirrhosis of liver with ascites, anasarca and pleural effusion Alcoholic hepatitis Elevated LFTs and total bilirubin Small esophageal varices Thrombocytopenia Hepatic encephalopathy Coagulopathy  -Completed N-acetylcysteine  treatment.  Continue Rocephin  for SBP prophylaxis.  LFTs improving but bilirubin is still high.  Monitor LFTs.  Continue prednisone for 4 weeks.  Continue thiamine , multivitamin, folic acid .  Continue lactulose  -monitor platelets  Leukocytosis - Monitor  Hypokalemia -Resolved  AKI - Resolved  Acute metabolic acidosis - Labs pending today  Suspect alcohol induced pancreatitis - No reported evidence of obstruction in the biliary system.  GI following.  Diet advancement as per GI.  Severe protein calorie malnutrition - Follow nutrition recommendations.  Physical deconditioning - Will need SNF placement.  TOC following.   DVT prophylaxis: SCDs Code Status: Full Family Communication: Brother at bedside Disposition Plan: Status is: Inpatient Remains inpatient appropriate because: Of severity of illness    Consultants: GI/PCCM/  Procedures: As above  Antimicrobials:  Anti-infectives (From admission, onward)  Start     Dose/Rate Route Frequency Ordered Stop   08/17/23 1115   cefTRIAXone  (ROCEPHIN ) 1 g in sodium chloride  0.9 % 100 mL IVPB        1 g 200 mL/hr over 30 Minutes Intravenous Every 24 hours 08/17/23 1020     08/09/23 1800  piperacillin -tazobactam (ZOSYN ) IVPB 3.375 g  Status:  Discontinued        3.375 g 12.5 mL/hr over 240 Minutes Intravenous Every 8 hours 08/09/23 1315 08/15/23 1126   08/09/23 1000  piperacillin -tazobactam (ZOSYN ) IVPB 3.375 g  Status:  Discontinued        3.375 g 12.5 mL/hr over 240 Minutes Intravenous Every 12 hours 08/08/23 2023 08/09/23 1315   08/08/23 1345  ceFEPIme  (MAXIPIME ) 2 g in sodium chloride  0.9 % 100 mL IVPB        2 g 200 mL/hr over 30 Minutes Intravenous  Once 08/08/23 1336 08/08/23 1638   08/08/23 1345  metroNIDAZOLE  (FLAGYL ) IVPB 500 mg        500 mg 100 mL/hr over 60 Minutes Intravenous  Once 08/08/23 1336 08/08/23 1801   08/08/23 1345  vancomycin  (VANCOCIN ) IVPB 1000 mg/200 mL premix        1,000 mg 200 mL/hr over 60 Minutes Intravenous  Once 08/08/23 1336 08/08/23 1801        Subjective: Patient seen and examined at bedside.  Feels slightly better.  Denies worsening abdominal pain, fever or vomiting.  No bleeding reported overnight.  Objective: Vitals:   08/18/23 0830 08/18/23 0845 08/18/23 0900 08/18/23 0915  BP: 115/89 (!) 104/55 124/62 101/60  Pulse: 100 (!) 112 97 90  Resp: (!) 24 20 (!) 24 (!) 21  Temp:      TempSrc:      SpO2: 92% 97% 93% 93%  Weight:      Height:        Intake/Output Summary (Last 24 hours) at 08/18/2023 1055 Last data filed at 08/18/2023 0600 Gross per 24 hour  Intake 1141.03 ml  Output 650 ml  Net 491.03 ml   Filed Weights   08/11/23 0500 08/12/23 0100 08/12/23 0238  Weight: 53.1 kg 55 kg 55.3 kg    Examination:  General exam: Appears calm and comfortable.  Looks chronically ill and deconditioned.  On room air.  Icterus present.  Respiratory system: Bilateral decreased breath sounds at bases with intermittent tachypnea Cardiovascular system: S1 & S2 heard, mild  intermittent tachycardia present Gastrointestinal system: Abdomen is nondistended, soft and nontender. Normal bowel sounds heard. Extremities: No cyanosis, clubbing, edema  Central nervous system: Awake, slow to respond.  Poor historian.  No focal neurological deficits. Moving extremities Skin: No rashes, lesions or ulcers Psychiatry: Flat affect.  Not agitated.   Data Reviewed: I have personally reviewed following labs and imaging studies  CBC: Recent Labs  Lab 08/13/23 0755 08/14/23 0055 08/14/23 1455 08/15/23 0519 08/15/23 0903 08/16/23 0335 08/16/23 1136 08/17/23 0155 08/17/23 0744 08/17/23 1351 08/17/23 2358 08/18/23 0608  WBC 18.2* 18.6*  --  15.8*  --  18.7*  --   --  17.4*  --   --   --   NEUTROABS  --   --   --   --   --  15.3*  --   --  14.7*  --   --   --   HGB 8.0* 6.4*   < > 8.1*   < > 8.2*   < > 7.8* 7.9* 7.4* 9.0* 9.1*  HCT 24.3* 19.2*   < >  23.5*   < > 23.6*   < > 23.1* 23.5* 22.0* 26.2* 27.0*  MCV 94.2 92.8  --  84.5  --  84.3  --   --  87.0  --   --   --   PLT 70* 77*  --  98*  --  51*  --   --  95*  --   --   --    < > = values in this interval not displayed.   Basic Metabolic Panel: Recent Labs  Lab 08/13/23 0755 08/14/23 0055 08/15/23 0519 08/16/23 0335 08/17/23 0155  NA 144 145 139 144 141  K 3.4* 3.7 3.3* 3.2* 3.9  CL 113* 116* 105 114* 111  CO2 21* 19* 14* 21* 19*  GLUCOSE 154* 145* 235* 100* 145*  BUN 36* 40* 42* 46* 40*  CREATININE 0.58* 0.91 1.45* 1.31* 0.80  CALCIUM 8.8* 8.7* 7.2* 8.7* 8.9  MG  --  1.9 1.6* 2.2 2.0  PHOS  --  1.9* 9.8* 3.8 3.8   GFR: Estimated Creatinine Clearance: 66.2 mL/min (by C-G formula based on SCr of 0.8 mg/dL). Liver Function Tests: Recent Labs  Lab 08/13/23 0755 08/14/23 0055 08/15/23 0519 08/16/23 0335 08/17/23 0155  AST 144* 139* 309* 263* 155*  ALT 78* 79* 128* 150* 118*  ALKPHOS 61 55 31* 50 63  BILITOT 18.7* 16.8* 15.5* 19.2* 19.4*  PROT 6.3* 5.5* 4.3* 5.6* 5.8*  ALBUMIN  2.1* 1.8* 2.2* 2.5*  2.4*   Recent Labs  Lab 08/12/23 0423 08/15/23 0519  LIPASE 354* 117*   Recent Labs  Lab 08/15/23 0519  AMMONIA 63*   Coagulation Profile: Recent Labs  Lab 08/14/23 0055 08/15/23 0519 08/16/23 0340 08/17/23 0744  INR 2.2* 2.4* 1.9* 1.8*   Cardiac Enzymes: No results for input(s): "CKTOTAL", "CKMB", "CKMBINDEX", "TROPONINI" in the last 168 hours. BNP (last 3 results) No results for input(s): "PROBNP" in the last 8760 hours. HbA1C: No results for input(s): "HGBA1C" in the last 72 hours. CBG: Recent Labs  Lab 08/16/23 2141 08/17/23 0725 08/17/23 1119 08/17/23 1630 08/18/23 0752  GLUCAP 134* 136* 147* 143* 135*   Lipid Profile: No results for input(s): "CHOL", "HDL", "LDLCALC", "TRIG", "CHOLHDL", "LDLDIRECT" in the last 72 hours. Thyroid Function Tests: No results for input(s): "TSH", "T4TOTAL", "FREET4", "T3FREE", "THYROIDAB" in the last 72 hours. Anemia Panel: No results for input(s): "VITAMINB12", "FOLATE", "FERRITIN", "TIBC", "IRON", "RETICCTPCT" in the last 72 hours. Sepsis Labs: No results for input(s): "PROCALCITON", "LATICACIDVEN" in the last 168 hours.  Recent Results (from the past 240 hours)  Blood Culture (routine x 2)     Status: None   Collection Time: 08/08/23  2:17 PM   Specimen: BLOOD  Result Value Ref Range Status   Specimen Description   Final    BLOOD BLOOD RIGHT HAND Performed at [facility], [address] [place], [place] [address]    Special Requests   Final    BOTTLES DRAWN AEROBIC ONLY Blood Culture results may not be optimal due to an inadequate volume of blood received in culture bottles Performed at [facility], [address] [place], [place] [address]    Culture   Final    NO GROWTH 5 DAYS Performed at [facility], [address] [place], [place] [address]    Report Status 08/13/2023 FINAL  Final  Blood Culture (routine x 2)     Status: None   Collection Time: 08/08/23  2:17  PM   Specimen:  BLOOD  Result Value Ref Range Status   Specimen Description   Final    BLOOD LEFT ANTECUBITAL Performed at [facility], [address], [place], [place] [address]    Special Requests   Final    BOTTLES DRAWN AEROBIC AND ANAEROBIC Blood Culture adequate volume Performed at [facility], [address] [place], [place] [address]    Culture   Final    NO GROWTH 5 DAYS Performed at [facility], [address], [place], [place] [address]    Report Status 08/13/2023 FINAL  Final  Resp panel by RT-PCR (RSV, Flu A&B, Covid) Anterior Nasal Swab     Status: None   Collection Time: 08/08/23  4:50 PM   Specimen: Anterior Nasal Swab  Result Value Ref Range Status   SARS Coronavirus 2 by RT PCR NEGATIVE NEGATIVE Final    Comment: (NOTE) SARS-CoV-2 target nucleic acids are NOT DETECTED.  The SARS-CoV-2 RNA is generally detectable in upper respiratory specimens during the acute phase of infection. The lowest concentration of SARS-CoV-2 viral copies this assay can detect is 138 copies/mL. A negative result does not preclude SARS-Cov-2 infection and should not be used as the sole basis for treatment or other patient management decisions. A negative result may occur with  improper specimen collection/handling, submission of specimen other than nasopharyngeal swab, presence of viral mutation(s) within the areas targeted by this assay, and inadequate number of viral copies(<138 copies/mL). A negative result must be combined with clinical observations, patient history, and epidemiological information. The expected result is Negative.  Fact Sheet for Patients:  BloggerCourse.com  Fact Sheet for Healthcare Providers:  SeriousBroker.it  This test is no t yet approved or cleared by the [place]  FDA and  has been authorized for detection and/or diagnosis of SARS-CoV-2 by FDA under an  Emergency Use Authorization (EUA). This EUA will remain  in effect (meaning this test can be used) for the duration of the COVID-19 declaration under Section 564(b)(1) of the Act, 21 U.S.C.section 360bbb-3(b)(1), unless the authorization is terminated  or revoked sooner.       Influenza A by PCR NEGATIVE NEGATIVE Final   Influenza B by PCR NEGATIVE NEGATIVE Final    Comment: (NOTE) The Xpert Xpress SARS-CoV-2/FLU/RSV plus assay is intended as an aid in the diagnosis of influenza from Nasopharyngeal swab specimens and should not be used as a sole basis for treatment. Nasal washings and aspirates are unacceptable for Xpert Xpress SARS-CoV-2/FLU/RSV testing.  Fact Sheet for Patients: BloggerCourse.com  Fact Sheet for Healthcare Providers: SeriousBroker.it  This test is not yet approved or cleared by the [place]  FDA and has been authorized for detection and/or diagnosis of SARS-CoV-2 by FDA under an Emergency Use Authorization (EUA). This EUA will remain in effect (meaning this test can be used) for the duration of the COVID-19 declaration under Section 564(b)(1) of the Act, 21 U.S.C. section 360bbb-3(b)(1), unless the authorization is terminated or revoked.     Resp Syncytial Virus by PCR NEGATIVE NEGATIVE Final    Comment: (NOTE) Fact Sheet for Patients: BloggerCourse.com  Fact Sheet for Healthcare Providers: SeriousBroker.it  This test is not yet approved or cleared by the [place]  FDA and has been authorized for detection and/or diagnosis of SARS-CoV-2 by FDA under an Emergency Use Authorization (EUA). This EUA will remain in effect (meaning this test can be used) for the duration of the COVID-19 declaration under Section 564(b)(1) of the Act, 21 U.S.C. section 360bbb-3(b)(1),  unless the authorization is terminated or revoked.  Performed at [facility], [address], [place], [place] [address]   MRSA Next Gen by PCR, Nasal     Status: None   Collection Time: 08/08/23  8:52 PM   Specimen: Nasal Mucosa; Nasal Swab  Result Value Ref Range Status   MRSA by PCR Next Gen NOT DETECTED NOT DETECTED Final    Comment: (NOTE) The GeneXpert MRSA Assay (FDA approved for NASAL specimens only), is one component of a comprehensive MRSA colonization surveillance program. It is not intended to diagnose MRSA infection nor to guide or monitor treatment for MRSA infections. Test performance is not FDA approved in patients less than 62 years old. Performed at [facility], [address], [place], [place] [address]   Body fluid culture w Gram Stain     Status: None   Collection Time: 08/10/23 12:18 PM   Specimen: Peritoneal Cavity; Peritoneal Fluid  Result Value Ref Range Status   Specimen Description   Final    PERITONEAL CAVITY Performed at [facility], [address], [place], [place] [address]    Special Requests   Final    NONE Performed at [facility], [address] [place], [place] [address]    Gram Stain   Final    WBC PRESENT, PREDOMINANTLY PMN NO ORGANISMS SEEN CYTOSPIN SMEAR    Culture   Final    NO GROWTH 3 DAYS Performed at [facility], [address] [place], [place] [address]    Report Status 08/14/2023 FINAL  Final  MRSA Next Gen by PCR, Nasal     Status: None   Collection Time: 08/14/23  5:10 AM   Specimen: Nasal Mucosa; Nasal Swab  Result Value Ref Range Status   MRSA by PCR Next Gen NOT DETECTED NOT DETECTED Final    Comment: (NOTE) The GeneXpert MRSA Assay (FDA approved for NASAL specimens only), is one component of a comprehensive MRSA colonization surveillance program. It is not intended to diagnose MRSA infection nor to guide or monitor treatment for MRSA infections. Test performance is not FDA approved in patients less than 64  years old. Performed at [facility], [address], [place], [place] [address]          Radiology Studies: No results found.      Scheduled Meds:  Chlorhexidine  Gluconate Cloth  6 each Topical Daily   feeding supplement  1 Container Oral TID BM   folic acid   1 mg Oral Daily   influenza vaccine adjuvanted  0.5 mL Intramuscular Tomorrow-1000   insulin  aspart  0-6 Units Subcutaneous TID WC   lactulose   20 g Oral BID   midodrine   10 mg Oral TID WC   multivitamin with minerals  1 tablet Oral Daily   pantoprazole  (PROTONIX ) IV  40 mg Intravenous Q12H   pneumococcal 20-valent conjugate vaccine  0.5 mL Intramuscular Tomorrow-1000   prednisoLONE   40 mg Oral Daily   sodium chloride  flush  10-40 mL Intracatheter Q12H   sucralfate   1 g Oral TID WC & HS   thiamine   100 mg Oral Daily   Continuous Infusions:  cefTRIAXone  (ROCEPHIN )  IV Stopped (08/17/23 1115)   norepinephrine  (LEVOPHED ) Adult infusion Stopped (08/17/23 2145)          Audria Leather, MD [facility] 08/18/2023, 10:55 AM

## 2023-08-18 NOTE — Progress Notes (Signed)
 Subjective: Patient states he feels 80% better.  Is on clear liquid diet without nausea or vomiting.  Last bowel movement was yesterday evening described as brown and black and not bloody.  Objective: Vital signs in last 24 hours: Temp:  [96.6 F (35.9 C)-98.4 F (36.9 C)] 97.9 F (36.6 C) (05/01 0800) Pulse Rate:  [76-112] 90 (05/01 0915) Resp:  [13-30] 21 (05/01 0915) BP: (89-148)/(54-89) 101/60 (05/01 0915) SpO2:  [92 %-100 %] 93 % (05/01 0915) Weight change:  Last BM Date : 08/16/23  PE: Awake, oriented x 3 GENERAL: Deeply icteric, mild pallor ABDOMEN: Distended but nontender, normoactive bowel sounds EXTREMITIES: No deformity  Lab Results: Results for orders placed or performed during the hospital encounter of 08/08/23 (from the past 48 hours)  Glucose, capillary     Status: Abnormal   Collection Time: 08/16/23 11:27 AM  Result Value Ref Range   Glucose-Capillary 108 (H) 70 - 99 mg/dL    Comment: Glucose reference range applies only to samples taken after fasting for at least 8 hours.  Prepare fresh frozen plasma     Status: None   Collection Time: 08/16/23 11:28 AM  Result Value Ref Range   Unit Number [id number]    Blood Component Type THW PLS APHR    Unit division 00    Status of Unit ISSUED,FINAL    Transfusion Status      OK TO TRANSFUSE Performed at [facility], [address] [place], [place] [address]    Unit Number [id number]    Blood Component Type THW PLS APHR    Unit division 00    Status of Unit ISSUED,FINAL    Transfusion Status OK TO TRANSFUSE   Hemoglobin and hematocrit, blood     Status: Abnormal   Collection Time: 08/16/23 11:36 AM  Result Value Ref Range   Hemoglobin 8.8 (L) 13.0 - 17.0 g/dL   HCT 28.4 (L) 13.2 - 44.0 %    Comment: Performed at [facility], [address] [place], [place] [address]  Glucose, capillary     Status: Abnormal   Collection Time: 08/16/23  4:59 PM  Result Value Ref  Range   Glucose-Capillary 125 (H) 70 - 99 mg/dL    Comment: Glucose reference range applies only to samples taken after fasting for at least 8 hours.  Hemoglobin and hematocrit, blood     Status: Abnormal   Collection Time: 08/16/23  8:19 PM  Result Value Ref Range   Hemoglobin 7.7 (L) 13.0 - 17.0 g/dL   HCT 53.6 (L) 64.4 - 03.4 %    Comment: Performed at [facility], [address], [place], [place] [address]  Glucose, capillary     Status: Abnormal   Collection Time: 08/16/23  9:41 PM  Result Value Ref Range   Glucose-Capillary 134 (H) 70 - 99 mg/dL    Comment: Glucose reference range applies only to samples taken after fasting for at least 8 hours.   Comment 1 Notify RN    Comment 2 Document in Chart   Hemoglobin and hematocrit, blood     Status: Abnormal   Collection Time: 08/17/23  1:55 AM  Result Value Ref Range   Hemoglobin 7.8 (L) 13.0 - 17.0 g/dL   HCT 56.3 (L) 87.5 - 64.3 %    Comment: Performed at [facility], [address] [place], [place] [address]  Magnesium      Status: None   Collection Time: 08/17/23  1:55  AM  Result Value Ref Range   Magnesium  2.0 1.7 - 2.4 mg/dL    Comment: Performed at [facility], [address], [place], [place] [address]  Phosphorus     Status: None   Collection Time: 08/17/23  1:55 AM  Result Value Ref Range   Phosphorus 3.8 2.5 - 4.6 mg/dL    Comment: ICTERUS AT THIS LEVEL MAY AFFECT RESULT Performed at [facility], [address], [place], [place] [address]   Comprehensive metabolic panel     Status: Abnormal   Collection Time: 08/17/23  1:55 AM  Result Value Ref Range   Sodium 141 135 - 145 mmol/L   Potassium 3.9 3.5 - 5.1 mmol/L    Comment: DELTA CHECK NOTED   Chloride 111 98 - 111 mmol/L   CO2 19 (L) 22 - 32 mmol/L   Glucose, Bld 145 (H) 70 - 99 mg/dL    Comment: Glucose reference range applies only to samples taken after fasting for at least 8  hours.   BUN 40 (H) 8 - 23 mg/dL   Creatinine, Ser 1.30 0.61 - 1.24 mg/dL   Calcium 8.9 8.9 - 86.5 mg/dL   Total Protein 5.8 (L) 6.5 - 8.1 g/dL   Albumin  2.4 (L) 3.5 - 5.0 g/dL   AST 784 (H) 15 - 41 U/L   ALT 118 (H) 0 - 44 U/L   Alkaline Phosphatase 63 38 - 126 U/L   Total Bilirubin 19.4 (HH) 0.0 - 1.2 mg/dL    Comment: CRITICAL VALUE NOTED. VALUE IS CONSISTENT WITH PREVIOUSLY REPORTED/CALLED VALUE   GFR, Estimated >60 >60 mL/min    Comment: (NOTE) Calculated using the CKD-EPI Creatinine Equation (2021)    Anion gap 11 5 - 15    Comment: Performed at [facility], [address], [place], [place] [address]  Fibrinogen      Status: None   Collection Time: 08/17/23  1:55 AM  Result Value Ref Range   Fibrinogen  278 210 - 475 mg/dL    Comment: (NOTE) Fibrinogen  results may be underestimated in patients receiving thrombolytic therapy. Performed at [facility], [address], [place], [place] [address]   Glucose, capillary     Status: Abnormal   Collection Time: 08/17/23  7:25 AM  Result Value Ref Range   Glucose-Capillary 136 (H) 70 - 99 mg/dL    Comment: Glucose reference range applies only to samples taken after fasting for at least 8 hours.  CBC with Differential/Platelet     Status: Abnormal   Collection Time: 08/17/23  7:44 AM  Result Value Ref Range   WBC 17.4 (H) 4.0 - 10.5 K/uL   RBC 2.70 (L) 4.22 - 5.81 MIL/uL   Hemoglobin 7.9 (L) 13.0 - 17.0 g/dL   HCT 32.4 (L) 40.1 - 02.7 %   MCV 87.0 80.0 - 100.0 fL   MCH 29.3 26.0 - 34.0 pg   MCHC 33.6 30.0 - 36.0 g/dL   RDW 25.3 (H) 66.4 - 40.3 %   Platelets 95 (L) 150 - 400 K/uL    Comment: SPECIMEN CHECKED FOR CLOTS REPEATED TO VERIFY PLATELET COUNT CONFIRMED BY SMEAR    nRBC 0.2 0.0 - 0.2 %   Neutrophils Relative % 84 %   Neutro Abs 14.7 (H) 1.7 - 7.7 K/uL   Lymphocytes Relative 8 %   Lymphs Abs 1.3 0.7 - 4.0 K/uL   Monocytes Relative 7 %   Monocytes Absolute 1.2 (H) 0.1 - 1.0  K/uL   Eosinophils Relative 0 %   Eosinophils Absolute 0.0 0.0 - 0.5 K/uL   Basophils Relative 0 %   Basophils Absolute 0.0 0.0 - 0.1 K/uL   WBC Morphology MORPHOLOGY UNREMARKABLE    Smear Review MORPHOLOGY UNREMARKABLE    Immature Granulocytes 1 %   Abs Immature Granulocytes 0.08 (H) 0.00 - 0.07 K/uL   Burr Cells PRESENT    Target Cells PRESENT     Comment: Performed at [facility], [address] [place], [place] [address]  Protime-INR     Status: Abnormal   Collection Time: 08/17/23  7:44 AM  Result Value Ref Range   Prothrombin Time 21.4 (H) 11.4 - 15.2 seconds   INR 1.8 (H) 0.8 - 1.2    Comment: (NOTE) INR goal varies based on device and disease states. Performed at [facility], [address], [place], [place] [address]   Prepare fresh frozen plasma     Status: None (Preliminary result)   Collection Time: 08/17/23  8:51 AM  Result Value Ref Range   Unit Number [id number]    Blood Component Type THW PLS APHR    Unit division A0    Status of Unit ISSUED    Transfusion Status      OK TO TRANSFUSE Performed at [facility], [address] [place], [place] [address]    Unit Number [id number]    Blood Component Type THW PLS APHR    Unit division B0    Status of Unit ISSUED    Transfusion Status OK TO TRANSFUSE   Glucose, capillary     Status: Abnormal   Collection Time: 08/17/23 11:19 AM  Result Value Ref Range   Glucose-Capillary 147 (H) 70 - 99 mg/dL    Comment: Glucose reference range applies only to samples taken after fasting for at least 8 hours.  Hemoglobin and hematocrit, blood     Status: Abnormal   Collection Time: 08/17/23  1:51 PM  Result Value Ref Range   Hemoglobin 7.4 (L) 13.0 - 17.0 g/dL   HCT 84.1 (L) 32.4 - 40.1 %    Comment: Performed at [facility], [address] [place], [place] [address]  Glucose, capillary     Status: Abnormal   Collection Time: 08/17/23   4:30 PM  Result Value Ref Range   Glucose-Capillary 143 (H) 70 - 99 mg/dL    Comment: Glucose reference range applies only to samples taken after fasting for at least 8 hours.  Prepare RBC (crossmatch)     Status: None   Collection Time: 08/17/23  5:00 PM  Result Value Ref Range   Order Confirmation      BB SAMPLE OR UNITS ALREADY AVAILABLE Performed at [facility], [address], [place], [place] [address]   Hemoglobin and hematocrit, blood     Status: Abnormal   Collection Time: 08/17/23 11:58 PM  Result Value Ref Range   Hemoglobin 9.0 (L) 13.0 - 17.0 g/dL   HCT 03.4 (L) 74.2 - 59.5 %    Comment: Performed at [facility], [address] [place], [place] [address]  Type and screen [facility]     Status: None   Collection Time: 08/17/23 11:58 PM  Result Value Ref Range   ABO/RH(D) O POS    Antibody Screen NEG    Sample Expiration      08/20/2023,2359 Performed at [facility], [address] [place], [place] [address]   Hemoglobin and hematocrit, blood     Status: Abnormal   Collection Time: 08/18/23  6:08 AM  Result Value Ref Range   Hemoglobin 9.1 (L) 13.0 - 17.0 g/dL   HCT 19.1 (L) 47.8 - 29.5 %    Comment: Performed at [facility], [address] [place], [place] [address]  Glucose, capillary     Status: Abnormal   Collection Time: 08/18/23  7:52 AM  Result Value Ref Range   Glucose-Capillary 135 (H) 70 - 99 mg/dL    Comment: Glucose reference range applies only to samples taken after fasting for at least 8 hours.    Studies/Results: No results found.  Medications: I have reviewed the patient's current medications.  Assessment: Hemoglobin stable at 9.1 from 9 yesterday  Duodenal ulcer with visible vessel requiring epinephrine /bipolar cautery/Purastat gel placement, multiple units of PRBC, FFP, cryoprecipitate, platelet transfusions and vitamin K x 2     Cirrhotic-T. bili 19.4/AST 155/ALT 118/ALP 63,  on prednisolone  for alcoholic hepatitis since 08/12/2023(for total 28 days),  MELD Na 24 PT/INR 21.4/1.8 platelet 95 Mild ascites, anasarca and pleural effusion Small esophageal varices  On IV ceftriaxone  while patient is in hospital as he is cirrhotic and has ascites  Plan: Will start on full liquid diet Continue Carafate  4 times a day for total 2 weeks, continue pantoprazole  40 mg twice a day for total 2 months. Continue IV ceftriaxone  while inpatient Continue lactulose  20 g 2 times a day Continue prednisolone  40 mg once a day, total 28 days, started on 08/12/2023 On multivitamin, will resume folic acid  and thiamine .   Genell Ken, MD 08/18/2023, 10:17 AM

## 2023-08-18 NOTE — Plan of Care (Signed)
  Problem: Education: Goal: Ability to describe self-care measures that may prevent or decrease complications (Diabetes Survival Skills Education) will improve Outcome: Progressing   Problem: Coping: Goal: Ability to adjust to condition or change in health will improve Outcome: Progressing   Problem: Nutritional: Goal: Maintenance of adequate nutrition will improve Outcome: Progressing Goal: Progress toward achieving an optimal weight will improve Outcome: Progressing   Problem: Fluid Volume: Goal: Ability to maintain a balanced intake and output will improve Outcome: Progressing

## 2023-08-19 DIAGNOSIS — K7200 Acute and subacute hepatic failure without coma: Secondary | ICD-10-CM | POA: Diagnosis not present

## 2023-08-19 LAB — GLUCOSE, CAPILLARY
Glucose-Capillary: 103 mg/dL — ABNORMAL HIGH (ref 70–99)
Glucose-Capillary: 157 mg/dL — ABNORMAL HIGH (ref 70–99)
Glucose-Capillary: 108 mg/dL — ABNORMAL HIGH (ref 70–99)

## 2023-08-19 LAB — COMPREHENSIVE METABOLIC PANEL WITH GFR
ALT: 105 U/L — ABNORMAL HIGH (ref 0–44)
AST: 110 U/L — ABNORMAL HIGH (ref 15–41)
Albumin: 2.3 g/dL — ABNORMAL LOW (ref 3.5–5.0)
Alkaline Phosphatase: 81 U/L (ref 38–126)
Anion gap: 8 (ref 5–15)
BUN: 35 mg/dL — ABNORMAL HIGH (ref 8–23)
CO2: 20 mmol/L — ABNORMAL LOW (ref 22–32)
Calcium: 9.3 mg/dL (ref 8.9–10.3)
Chloride: 113 mmol/L — ABNORMAL HIGH (ref 98–111)
Creatinine, Ser: 0.79 mg/dL (ref 0.61–1.24)
GFR, Estimated: >60 mL/min (ref >60)
Glucose, Bld: 115 mg/dL — ABNORMAL HIGH (ref 70–99)
Potassium: 3.2 mmol/L — ABNORMAL LOW (ref 3.5–5.1)
Sodium: 141 mmol/L (ref 135–145)
Total Bilirubin: 18.7 mg/dL (ref 0.0–1.2)
Total Protein: 6 g/dL — ABNORMAL LOW (ref 6.5–8.1)

## 2023-08-19 LAB — MAGNESIUM: Magnesium: 1.8 mg/dL (ref 1.7–2.4)

## 2023-08-19 MED ORDER — POTASSIUM CHLORIDE 20 MEQ PO PACK
40.0000 meq | PACK | Freq: Two times a day (BID) | ORAL | Status: AC
  Administered 2023-08-19 (×2): 40 meq via ORAL
  Filled 2023-08-19 (×2): qty 2

## 2023-08-19 NOTE — TOC Progression Note (Signed)
 Transition of Care [facility]) - Progression Note    Patient Details  Name: Kevin Velez MRN: [id number] Date of Birth: 1950-06-28  [facility] CM/SW Contact  Amaryllis Junior, [place] Phone Number: 08/19/2023, 3:52 PM  Clinical Narrative:    CSW started ins auth, pending approval. Per MD, pt nearing medical readiness to dc to SNF.   Expected Discharge Plan: Skilled Nursing Facility Barriers to Discharge: Continued Medical Work up, Other (must enter comment) (await bed choice)  Expected Discharge Plan and Services In-house Referral: Clinical Social Work     Living arrangements for the past 2 months: Single Family Home                 DME Arranged: N/A DME Agency: NA                   Social Determinants of Health (SDOH) Interventions SDOH Screenings   Food Insecurity: Patient Unable To Answer (08/09/2023)  Housing: Patient Unable To Answer (08/09/2023)  Transportation Needs: Patient Unable To Answer (08/09/2023)  Utilities: Patient Unable To Answer (08/09/2023)  Social Connections: Patient Unable To Answer (08/09/2023)  Tobacco Use: High Risk (08/14/2023)    Readmission Risk Interventions    08/11/2023    2:18 PM  Readmission Risk Prevention Plan  Transportation Screening Complete  HRI or Home Care Consult Complete  Social Work Consult for Recovery Care Planning/Counseling Complete  Palliative Care Screening Not Applicable  Medication Review Oceanographer) Complete

## 2023-08-19 NOTE — Plan of Care (Signed)
  Problem: Fluid Volume: Goal: Ability to maintain a balanced intake and output will improve Outcome: Progressing   Problem: Health Behavior/Discharge Planning: Goal: Ability to manage health-related needs will improve Outcome: Progressing   Problem: Tissue Perfusion: Goal: Adequacy of tissue perfusion will improve Outcome: Progressing

## 2023-08-19 NOTE — Progress Notes (Signed)
 PROGRESS NOTE    Kevin Velez  ZOX:[id number] DOB: [birth year] DOA: 08/08/2023 PCP: Patient, No Pcp Per   Brief Narrative:  73 years old male with unknown past medical history who lives in a group home presented in the ED with several days history of worsening confusion, inability to get out of bed, weakness.  On presentation, he was encephalopathic with hemoglobin of 3, 5 and acute hepatic failure with elevated INR and LFTs with AKI.  He was intubated in the ED and admitted to ICU under PCCM service.  GI was consulted.  He was found to have cirrhosis of liver on ultrasound.  He was initially treated with NAC infusion which was subsequently discontinued.  He was also started on prednisolone  for possible asthmatic hepatitis and Zosyn  for possible infectious cause including biliary obstruction.  He received packed red cells transfusion.  He was extubated on 08/10/2023.  Had diagnostic paracentesis done on 08/10/2023 which was negative for SBP.  He was transferred back to ICU with hematochezia and shock on 08/14/2023.  He had EGD on 08/14/2023 which showed large duodenal ulcer, visible vessel with active bleeding treated with epinephrine  injection, cautery, small nonbleeding esophageal varices.  He required 2 more units of packed red cells along with platelets and FFP on 08/15/2023 for further bleeding.  He needed Levophed  for pressor support as well.  Subsequently, Levophed  has been discontinued.  He has been transferred back to [facility] service from 08/18/2023 onwards.  PT recommending SNF placement.  Assessment & Plan:   Hemorrhagic shock Acute blood loss anemia -Patient has required multiple units of packed red cell transfusion as well as platelets, cryoprecipitate and FFP transfusion as well as vitamin K during this hospitalization. - There was a concern for underlying infection causing shock and he has completed 7-day course of Zosyn  although cultures have been negative so far and no source was identified.   Echo showed intact LV function -Blood pressure on the lower side but stable.  Continue midodrine .  Currently on Rocephin  for SBP prophylaxis. - Hemoglobin 9.2 on 08/18/2023, pending this morning.  Continue to monitor H&H.  Last blood transfusion was on 08/15/2023.  Acute GI bleeding, bleeding duodenal ulcer with visible vessel - Had EGD on 08/14/2023 which showed showed large duodenal ulcer, visible vessel with active bleeding treated with epinephrine  injection, cautery, small nonbleeding esophageal varices.  Continued to have bleeding afterwards with melena.  Repeat CTA of abdomen on 08/15/2023 showed no further bleeding. - No bleeding reported overnight.  GI following.  Continue PPI and Carafate .  Cirrhosis of liver with ascites, anasarca and pleural effusion Alcoholic hepatitis Elevated LFTs and total bilirubin Small esophageal varices Thrombocytopenia Hepatic encephalopathy Coagulopathy  -Completed N-acetylcysteine  treatment.  Continue Rocephin  for SBP prophylaxis.  LFTs improving but bilirubin is still high.  Monitor LFTs.  Continue prednisolone  for 4 weeks.  Continue thiamine , multivitamin, folic acid .  Continue lactulose  -monitor platelets  Leukocytosis - Monitor  Hypokalemia - Replace.  Repeat a.m. labs  AKI - Resolved  Acute metabolic acidosis - Bicarb improving.  Monitor.  Encourage oral intake.  Suspect alcohol induced pancreatitis - No reported evidence of obstruction in the biliary system.  GI following.  Diet advancement as per GI.  Severe protein calorie malnutrition - Follow nutrition recommendations.  Physical deconditioning - Will need SNF placement.  TOC following.   DVT prophylaxis: SCDs Code Status: Full Family Communication: Brother at bedside Disposition Plan: Status is: Inpatient Remains inpatient appropriate because: Of severity of illness    Consultants:  GI/PCCM/  Procedures: As above  Antimicrobials:  Anti-infectives (From admission, onward)     Start     Dose/Rate Route Frequency Ordered Stop   08/17/23 1115  cefTRIAXone  (ROCEPHIN ) 1 g in sodium chloride  0.9 % 100 mL IVPB        1 g 200 mL/hr over 30 Minutes Intravenous Every 24 hours 08/17/23 1020     08/09/23 1800  piperacillin -tazobactam (ZOSYN ) IVPB 3.375 g  Status:  Discontinued        3.375 g 12.5 mL/hr over 240 Minutes Intravenous Every 8 hours 08/09/23 1315 08/15/23 1126   08/09/23 1000  piperacillin -tazobactam (ZOSYN ) IVPB 3.375 g  Status:  Discontinued        3.375 g 12.5 mL/hr over 240 Minutes Intravenous Every 12 hours 08/08/23 2023 08/09/23 1315   08/08/23 1345  ceFEPIme  (MAXIPIME ) 2 g in sodium chloride  0.9 % 100 mL IVPB        2 g 200 mL/hr over 30 Minutes Intravenous  Once 08/08/23 1336 08/08/23 1638   08/08/23 1345  metroNIDAZOLE  (FLAGYL ) IVPB 500 mg        500 mg 100 mL/hr over 60 Minutes Intravenous  Once 08/08/23 1336 08/08/23 1801   08/08/23 1345  vancomycin  (VANCOCIN ) IVPB 1000 mg/200 mL premix        1,000 mg 200 mL/hr over 60 Minutes Intravenous  Once 08/08/23 1336 08/08/23 1801        Subjective: Patient seen and examined at bedside.  No agitation, fever, vomiting or bleeding reported. Objective: Vitals:   08/19/23 0500 08/19/23 0600 08/19/23 0602 08/19/23 0700  BP: 130/72 113/68  124/71  Pulse: 92 85  97  Resp: 19 19  18   Temp:   98.6 F (37 C)   TempSrc:      SpO2: 91% 94%  94%  Weight: 54.3 kg     Height:        Intake/Output Summary (Last 24 hours) at 08/19/2023 0725 Last data filed at 08/19/2023 0531 Gross per 24 hour  Intake 30 ml  Output 600 ml  Net -570 ml   Filed Weights   08/12/23 0100 08/12/23 0238 08/19/23 0500  Weight: 55 kg 55.3 kg 54.3 kg    Examination:  General: Currently on room air.  No distress.  Chronically deconditioned abdomen.  Ectatic. ENT/neck: No thyromegaly.  JVD is not elevated  respiratory: Decreased breath sounds at bases bilaterally with some crackles; no wheezing CVS: S1-S2 heard, rate  controlled Abdominal: Soft, nontender, slightly distended; no organomegaly, bowel sounds are heard Extremities: Trace lower extremity edema; no cyanosis  CNS: Awake and alert.  Still slow to respond and a poor historian.  No focal neurologic deficit.  Moves extremities Lymph: No obvious lymphadenopathy Skin: No obvious ecchymosis/lesions  psych: Affect is mostly flat.  Not agitated currently. musculoskeletal: No obvious joint swelling/deformity    Data Reviewed: I have personally reviewed following labs and imaging studies  CBC: Recent Labs  Lab 08/14/23 0055 08/14/23 1455 08/15/23 0519 08/15/23 0903 08/16/23 0335 08/16/23 1136 08/17/23 0744 08/17/23 1351 08/17/23 2358 08/18/23 0608 08/18/23 1228  WBC 18.6*  --  15.8*  --  18.7*  --  17.4*  --   --   --  15.7*  NEUTROABS  --   --   --   --  15.3*  --  14.7*  --   --   --  13.8*  HGB 6.4*   < > 8.1*   < > 8.2*   < >  7.9* 7.4* 9.0* 9.1* 9.2*  HCT 19.2*   < > 23.5*   < > 23.6*   < > 23.5* 22.0* 26.2* 27.0* 28.2*  MCV 92.8  --  84.5  --  84.3  --  87.0  --   --   --  91.0  PLT 77*  --  98*  --  51*  --  95*  --   --   --  111*   < > = values in this interval not displayed.   Basic Metabolic Panel: Recent Labs  Lab 08/14/23 0055 08/15/23 0519 08/16/23 0335 08/17/23 0155 08/19/23 0522  NA 145 139 144 141 141  K 3.7 3.3* 3.2* 3.9 3.2*  CL 116* 105 114* 111 113*  CO2 19* 14* 21* 19* 20*  GLUCOSE 145* 235* 100* 145* 115*  BUN 40* 42* 46* 40* 35*  CREATININE 0.91 1.45* 1.31* 0.80 0.79  CALCIUM 8.7* 7.2* 8.7* 8.9 9.3  MG 1.9 1.6* 2.2 2.0 1.8  PHOS 1.9* 9.8* 3.8 3.8  --    GFR: Estimated Creatinine Clearance: 65 mL/min (by C-G formula based on SCr of 0.79 mg/dL). Liver Function Tests: Recent Labs  Lab 08/14/23 0055 08/15/23 0519 08/16/23 0335 08/17/23 0155 08/19/23 0522  AST 139* 309* 263* 155* 110*  ALT 79* 128* 150* 118* 105*  ALKPHOS 55 31* 50 63 81  BILITOT 16.8* 15.5* 19.2* 19.4* 18.7*  PROT 5.5* 4.3* 5.6*  5.8* 6.0*  ALBUMIN  1.8* 2.2* 2.5* 2.4* 2.3*   Recent Labs  Lab 08/15/23 0519  LIPASE 117*   Recent Labs  Lab 08/15/23 0519  AMMONIA 63*   Coagulation Profile: Recent Labs  Lab 08/14/23 0055 08/15/23 0519 08/16/23 0340 08/17/23 0744  INR 2.2* 2.4* 1.9* 1.8*   Cardiac Enzymes: No results for input(s): "CKTOTAL", "CKMB", "CKMBINDEX", "TROPONINI" in the last 168 hours. BNP (last 3 results) No results for input(s): "PROBNP" in the last 8760 hours. HbA1C: No results for input(s): "HGBA1C" in the last 72 hours. CBG: Recent Labs  Lab 08/17/23 1119 08/17/23 1630 08/18/23 0752 08/18/23 1157 08/18/23 1604  GLUCAP 147* 143* 135* 120* 133*   Lipid Profile: No results for input(s): "CHOL", "HDL", "LDLCALC", "TRIG", "CHOLHDL", "LDLDIRECT" in the last 72 hours. Thyroid Function Tests: No results for input(s): "TSH", "T4TOTAL", "FREET4", "T3FREE", "THYROIDAB" in the last 72 hours. Anemia Panel: No results for input(s): "VITAMINB12", "FOLATE", "FERRITIN", "TIBC", "IRON", "RETICCTPCT" in the last 72 hours. Sepsis Labs: No results for input(s): "PROCALCITON", "LATICACIDVEN" in the last 168 hours.  Recent Results (from the past 240 hours)  Body fluid culture w Gram Stain     Status: None   Collection Time: 08/10/23 12:18 PM   Specimen: Peritoneal Cavity; Peritoneal Fluid  Result Value Ref Range Status   Specimen Description   Final    PERITONEAL CAVITY Performed at [facility], [address] [place], [place] [address]    Special Requests   Final    NONE Performed at [facility], [address] [place], [place] [address]    Gram Stain   Final    WBC PRESENT, PREDOMINANTLY PMN NO ORGANISMS SEEN CYTOSPIN SMEAR    Culture   Final    NO GROWTH 3 DAYS Performed at [facility], [address] [place], [place] [address]    Report Status 08/14/2023 FINAL  Final  MRSA Next Gen by PCR, Nasal     Status: None   Collection Time:  08/14/23  5:10 AM  Specimen: Nasal Mucosa; Nasal Swab  Result Value Ref Range Status   MRSA by PCR Next Gen NOT DETECTED NOT DETECTED Final    Comment: (NOTE) The GeneXpert MRSA Assay (FDA approved for NASAL specimens only), is one component of a comprehensive MRSA colonization surveillance program. It is not intended to diagnose MRSA infection nor to guide or monitor treatment for MRSA infections. Test performance is not FDA approved in patients less than 58 years old. Performed at [facility], [address], [place], [place] [address]          Radiology Studies: No results found.      Scheduled Meds:  Chlorhexidine  Gluconate Cloth  6 each Topical Daily   feeding supplement  237 mL Oral TID BM   folic acid   1 mg Oral Daily   influenza vaccine adjuvanted  0.5 mL Intramuscular Tomorrow-1000   insulin  aspart  0-6 Units Subcutaneous TID WC   lactulose   20 g Oral BID   midodrine   10 mg Oral TID WC   multivitamin with minerals  1 tablet Oral Daily   pantoprazole  (PROTONIX ) IV  40 mg Intravenous Q12H   pneumococcal 20-valent conjugate vaccine  0.5 mL Intramuscular Tomorrow-1000   prednisoLONE   40 mg Oral Daily   sodium chloride  flush  10-40 mL Intracatheter Q12H   sucralfate   1 g Oral TID WC & HS   thiamine   100 mg Oral Daily   Continuous Infusions:  cefTRIAXone  (ROCEPHIN )  IV Stopped (08/18/23 1134)          Audria Leather, MD [facility] 08/19/2023, 7:25 AM

## 2023-08-19 NOTE — Progress Notes (Signed)
 Physical Therapy Treatment Patient Details Name: Kevin Velez MRN: [id number] DOB: 12-26-1950 Today's Date: 08/19/2023   History of Present Illness Patient is a 73 year old male who presented on 4/21 with increased lethargy. patient was found to be hypotensive and anemic with hgb of 3. patient was intubated upon admission and extubated on 4/23. WUJ:[id number] use, assumed cirrhosis.    PT Comments  Patient alert and ready to mobilize.Patient does require support to move to sitting and step to recliner using a RW. Patient should be ready to begin short distance ambulation with 2 person assistance. Patient will benefit from continued inpatient follow up therapy, <3 hours/day     If plan is discharge home, recommend the following: Two people to help with walking and/or transfers;A lot of help with bathing/dressing/bathroom;Assistance with cooking/housework;Direct supervision/assist for financial management;Help with stairs or ramp for entrance   Can travel by private vehicle     No  Equipment Recommendations  None recommended by PT    Recommendations for Other Services       Precautions / Restrictions Precautions Precautions: Fall Precaution/Restrictions Comments: incontinent, BP does run low Restrictions Weight Bearing Restrictions Per Provider Order: No     Mobility  Bed Mobility   Bed Mobility: Supine to Sit           General bed mobility comments: multimodal cues for initiating  moving les, Assist with trunk    Transfers Overall transfer level: Needs assistance Equipment used: Rolling walker (2 wheels) Transfers: Sit to/from Stand, Bed to chair/wheelchair/BSC Sit to Stand: Mod assist           General transfer comment: assistance to rise from bed at [facility], VC's on proper hand placement.   Slight hips/knees flexed. head flexed.step to recliner using Rw and mod support.    Ambulation/Gait                   Stairs             Wheelchair Mobility      Tilt Bed    Modified Rankin (Stroke Patients Only)       Balance Overall balance assessment: Needs assistance Sitting-balance support: Bilateral upper extremity supported, Feet supported Sitting balance-Leahy Scale: Fair       Standing balance-Leahy Scale: Poor                              Communication Communication Factors Affecting Communication: Reduced clarity of speech  Cognition Arousal: Alert Behavior During Therapy: Flat affect   PT - Cognitive impairments: Difficult to assess                       PT - Cognition Comments: AxO x 2 slightly slow to respond and move. Following commands: Intact      Cueing Cueing Techniques: Tactile cues, Verbal cues  Exercises      General Comments        Pertinent Vitals/Pain Pain Assessment Faces Pain Scale: Hurts little more Pain Location: ABD lower Pain Descriptors / Indicators: Guarding, Discomfort, Grimacing Pain Intervention(s): Monitored during session    Home Living                          Prior Function            PT Goals (current goals can now be found in the care plan section) Progress towards PT  goals: Progressing toward goals    Frequency    Min 2X/week      PT Plan      Co-evaluation              AM-PAC PT "6 Clicks" Mobility   Outcome Measure  Help needed turning from your back to your side while in a flat bed without using bedrails?: A Lot Help needed moving from lying on your back to sitting on the side of a flat bed without using bedrails?: A Lot Help needed moving to and from a bed to a chair (including a wheelchair)?: A Lot Help needed standing up from a chair using your arms (e.g., wheelchair or bedside chair)?: A Lot Help needed to walk in hospital room?: Total Help needed climbing 3-5 steps with a railing? : Total 6 Click Score: 10    End of Session   Activity Tolerance: Patient tolerated treatment well Patient left: in chair;with  call bell/phone within reach;with chair alarm set Nurse Communication: Mobility status PT Visit Diagnosis: Unsteadiness on feet (R26.81);Adult, failure to thrive (R62.7);Muscle weakness (generalized) (M62.81)     Time: 1610-9604 PT Time Calculation (min) (ACUTE ONLY): 17 min  Charges:    $Therapeutic Activity: 8-22 mins PT General Charges $$ ACUTE PT VISIT: 1 Visit                     Abelina Hoes PT Acute Rehabilitation Services Office [phone] Weekend [phone]    Dareen Ebbing 08/19/2023, 2:52 PM

## 2023-08-19 NOTE — Progress Notes (Signed)
 Subjective: Tolerating dysphagia 2, soft diet. Has had more bowel movements yesterday but they were brown, has not noted black stools or blood in his stool.  Objective: Vital signs in last 24 hours: Temp:  [97.6 F (36.4 C)-98.6 F (37 C)] 98 F (36.7 C) (05/02 0800) Pulse Rate:  [77-97] 79 (05/02 0800) Resp:  [16-25] 19 (05/02 0800) BP: (102-142)/(64-93) 118/73 (05/02 0800) SpO2:  [90 %-96 %] 96 % (05/02 0800) Weight:  [54.3 kg] 54.3 kg (05/02 0500) Weight change:  Last BM Date : 09/15/23  PE: Ill-appearing, deeply icteric GENERAL: Prominent and pallor  ABDOMEN: Distended, nontender, bowel sounds audible EXTREMITIES: No edema  Lab Results: Results for orders placed or performed during the hospital encounter of 08/08/23 (from the past 48 hours)  Glucose, capillary     Status: Abnormal   Collection Time: 08/17/23 11:19 AM  Result Value Ref Range   Glucose-Capillary 147 (H) 70 - 99 mg/dL    Comment: Glucose reference range applies only to samples taken after fasting for at least 8 hours.  Hemoglobin and hematocrit, blood     Status: Abnormal   Collection Time: 08/17/23  1:51 PM  Result Value Ref Range   Hemoglobin 7.4 (L) 13.0 - 17.0 g/dL   HCT 09.8 (L) 11.9 - 14.7 %    Comment: Performed at [facility], [address], [place], [place] [address]  Glucose, capillary     Status: Abnormal   Collection Time: 08/17/23  4:30 PM  Result Value Ref Range   Glucose-Capillary 143 (H) 70 - 99 mg/dL    Comment: Glucose reference range applies only to samples taken after fasting for at least 8 hours.  Prepare RBC (crossmatch)     Status: None   Collection Time: 08/17/23  5:00 PM  Result Value Ref Range   Order Confirmation      BB SAMPLE OR UNITS ALREADY AVAILABLE Performed at [facility], [address] [place], [place] [address]   Hemoglobin and hematocrit, blood     Status: Abnormal   Collection Time: 08/17/23 11:58 PM  Result Value Ref  Range   Hemoglobin 9.0 (L) 13.0 - 17.0 g/dL   HCT 65.7 (L) 84.6 - 96.2 %    Comment: Performed at [facility], [address], [place], [place] [address]  Type and screen [facility]     Status: None   Collection Time: 08/17/23 11:58 PM  Result Value Ref Range   ABO/RH(D) O POS    Antibody Screen NEG    Sample Expiration      08/20/2023,2359 Performed at [facility], [address], [place], [place] [address]   Hemoglobin and hematocrit, blood     Status: Abnormal   Collection Time: 08/18/23  6:08 AM  Result Value Ref Range   Hemoglobin 9.1 (L) 13.0 - 17.0 g/dL   HCT 01.0 (L) 27.2 - 53.6 %    Comment: Performed at [facility], [address], [place], [place] [address]  Glucose, capillary     Status: Abnormal   Collection Time: 08/18/23  7:52 AM  Result Value Ref Range   Glucose-Capillary 135 (H) 70 - 99 mg/dL    Comment: Glucose reference range applies only to samples taken after fasting for at least 8 hours.  Glucose, capillary     Status: Abnormal   Collection Time: 08/18/23 11:57 AM  Result Value Ref Range   Glucose-Capillary 120 (H) 70 - 99  mg/dL    Comment: Glucose reference range applies only to samples taken after fasting for at least 8 hours.  CBC with Differential/Platelet     Status: Abnormal   Collection Time: 08/18/23 12:28 PM  Result Value Ref Range   WBC 15.7 (H) 4.0 - 10.5 K/uL   RBC 3.10 (L) 4.22 - 5.81 MIL/uL   Hemoglobin 9.2 (L) 13.0 - 17.0 g/dL   HCT 16.1 (L) 09.6 - 04.5 %   MCV 91.0 80.0 - 100.0 fL   MCH 29.7 26.0 - 34.0 pg   MCHC 32.6 30.0 - 36.0 g/dL   RDW 40.9 (H) 81.1 - 91.4 %   Platelets 111 (L) 150 - 400 K/uL   nRBC 0.0 0.0 - 0.2 %   Neutrophils Relative % 87 %   Neutro Abs 13.8 (H) 1.7 - 7.7 K/uL   Lymphocytes Relative 5 %   Lymphs Abs 0.7 0.7 - 4.0 K/uL   Monocytes Relative 7 %   Monocytes Absolute 1.0 0.1 - 1.0 K/uL   Eosinophils Relative 0 %   Eosinophils  Absolute 0.0 0.0 - 0.5 K/uL   Basophils Relative 0 %   Basophils Absolute 0.0 0.0 - 0.1 K/uL   Immature Granulocytes 1 %   Abs Immature Granulocytes 0.10 (H) 0.00 - 0.07 K/uL    Comment: Performed at [facility], [address], [place], [place] [address]  Glucose, capillary     Status: Abnormal   Collection Time: 08/18/23  4:04 PM  Result Value Ref Range   Glucose-Capillary 133 (H) 70 - 99 mg/dL    Comment: Glucose reference range applies only to samples taken after fasting for at least 8 hours.  Comprehensive metabolic panel with GFR     Status: Abnormal   Collection Time: 08/19/23  5:22 AM  Result Value Ref Range   Sodium 141 135 - 145 mmol/L   Potassium 3.2 (L) 3.5 - 5.1 mmol/L   Chloride 113 (H) 98 - 111 mmol/L   CO2 20 (L) 22 - 32 mmol/L   Glucose, Bld 115 (H) 70 - 99 mg/dL    Comment: Glucose reference range applies only to samples taken after fasting for at least 8 hours.   BUN 35 (H) 8 - 23 mg/dL   Creatinine, Ser 6.21 0.61 - 1.24 mg/dL   Calcium 9.3 8.9 - 30.8 mg/dL   Total Protein 6.0 (L) 6.5 - 8.1 g/dL   Albumin  2.3 (L) 3.5 - 5.0 g/dL   AST 657 (H) 15 - 41 U/L   ALT 105 (H) 0 - 44 U/L   Alkaline Phosphatase 81 38 - 126 U/L   Total Bilirubin 18.7 (HH) 0.0 - 1.2 mg/dL    Comment: CRITICAL RESULT CALLED TO, READ BACK BY AND VERIFIED WITH MOYER, A RN @ 0701 08/19/23 CAL    GFR, Estimated >60 >60 mL/min    Comment: (NOTE) Calculated using the CKD-EPI Creatinine Equation (2021)    Anion gap 8 5 - 15    Comment: Performed at [facility], [address] [place], [place] [address]  Magnesium      Status: None   Collection Time: 08/19/23  5:22 AM  Result Value Ref Range   Magnesium  1.8 1.7 - 2.4 mg/dL    Comment: Performed at [facility], [address] [place], [place] [address]  Glucose, capillary     Status: Abnormal   Collection Time: 08/19/23  8:06 AM  Result Value Ref Range   Glucose-Capillary 103 (  H)  70 - 99 mg/dL    Comment: Glucose reference range applies only to samples taken after fasting for at least 8 hours.   Comment 1 Notify RN    Comment 2 Document in Chart     Studies/Results: No results found.  Medications: I have reviewed the patient's current medications.  Assessment: Duodenal ulcer with visible vessel requiring epinephrine /bipolar cautery/Purastat gel placement, multiple units of PRBC, FFP, cryoprecipitate, platelet transfusions and vitamin K x 2 Hemoglobin stable from 9.1-9.2     Cirrhotic-T. bili 18.7/AST 110/ALT 105/ALP 81,  on prednisolone  for alcoholic hepatitis since 08/12/2023(for total 28 days),  MELD Na 20 PT/INR 21.4/1.8 Platelet 111 Mild ascites, anasarca and pleural effusion Small esophageal varices  Plan: Continue dysphagia level 2 diet, continue PPI and Carafate  Continue prednisolone  total 28 days for alcoholic hepatitis Continue ceftriaxone  as he is cirrhotic, came in with a GI bleed and has ascites Possible DC to SNF GI will follow from a distance, please recall if needed. Recommend follow-up with Dr. Veronda Goody as an outpatient on discharge.  Genell Ken, MD 08/19/2023, 10:21 AM

## 2023-08-19 NOTE — Progress Notes (Signed)
 Speech Language Pathology Treatment: Dysphagia  Patient Details Name: Kevin Velez MRN: [id number] DOB: Aug 24, 1950 Today's Date: 08/19/2023 Time: 4098-1191 SLP Time Calculation (min) (ACUTE ONLY): 8 min  Assessment / Plan / Recommendation Clinical Impression  Pt tolerating soft solids and thin liquids. Taking pills per his preferences. His appetite is still limited, he says the food is a little too rich. Will maintain soft foods. No need for further SLP interventions. Ok to advance diet when pt would like to.   HPI HPI: 73 year old man with history of alcohol use, clear PMH but presumed underlying cirrhosis, admitted from group home 4/21 with acute encephalopathy, weakness, shock.  Found to have fulminant acute hepatic failure with coagulopathy, renal failure, profound anemia.  Required intubation mechanical ventilation,  Extubated on 4/23, started midodrine  4/24 and pressors weaned to off.  Has continued to have dark stools. 4/27 back to the ICU with hematochezia and shock. 4/27 EGD >> large duodenal ulcer, visible vessel with active bleeding treated with epinephrine  injection, cautery, small nonbleeding esophageal varices. 4/28 More bleeding overnight. GI advanced diet from clears to regular.      SLP Plan  Continue with current plan of care      Recommendations for follow up therapy are one component of a multi-disciplinary discharge planning process, led by the attending physician.  Recommendations may be updated based on patient status, additional functional criteria and insurance authorization.    Recommendations  Diet recommendations: Thin liquid;Dysphagia 2 (fine chop) Liquids provided via: Cup;Straw Medication Administration: Whole meds with liquid Supervision: Patient able to self feed Compensations: Follow solids with liquid Postural Changes and/or Swallow Maneuvers: Seated upright 90 degrees                  Oral care BID   None Dysphagia, unspecified (R13.10)      Continue with current plan of care     Kevin Velez, Kevin Velez  08/19/2023, 10:55 AM

## 2023-08-20 DIAGNOSIS — K7200 Acute and subacute hepatic failure without coma: Secondary | ICD-10-CM | POA: Diagnosis not present

## 2023-08-20 LAB — CBC WITH DIFFERENTIAL/PLATELET
Abs Immature Granulocytes: 0.15 10*3/uL — ABNORMAL HIGH (ref 0.00–0.07)
Basophils Absolute: 0 10*3/uL (ref 0.0–0.1)
Basophils Relative: 0 %
Eosinophils Absolute: 0.1 10*3/uL (ref 0.0–0.5)
Eosinophils Relative: 0 %
HCT: 28.6 % — ABNORMAL LOW (ref 39.0–52.0)
Hemoglobin: 9.5 g/dL — ABNORMAL LOW (ref 13.0–17.0)
Immature Granulocytes: 1 %
Lymphocytes Relative: 7 %
Lymphs Abs: 1 10*3/uL (ref 0.7–4.0)
MCH: 29.5 pg (ref 26.0–34.0)
MCHC: 33.2 g/dL (ref 30.0–36.0)
MCV: 88.8 fL (ref 80.0–100.0)
Monocytes Absolute: 0.9 10*3/uL (ref 0.1–1.0)
Monocytes Relative: 5 %
Neutro Abs: 14 10*3/uL — ABNORMAL HIGH (ref 1.7–7.7)
Neutrophils Relative %: 87 %
Platelets: 119 10*3/uL — ABNORMAL LOW (ref 150–400)
RBC: 3.22 MIL/uL — ABNORMAL LOW (ref 4.22–5.81)
RDW: 20.5 % — ABNORMAL HIGH (ref 11.5–15.5)
WBC: 16.1 10*3/uL — ABNORMAL HIGH (ref 4.0–10.5)
nRBC: 0 % (ref 0.0–0.2)

## 2023-08-20 LAB — COMPREHENSIVE METABOLIC PANEL WITH GFR
ALT: 113 U/L — ABNORMAL HIGH (ref 0–44)
AST: 115 U/L — ABNORMAL HIGH (ref 15–41)
Albumin: 2.4 g/dL — ABNORMAL LOW (ref 3.5–5.0)
Alkaline Phosphatase: 88 U/L (ref 38–126)
Anion gap: 10 (ref 5–15)
BUN: 30 mg/dL — ABNORMAL HIGH (ref 8–23)
CO2: 19 mmol/L — ABNORMAL LOW (ref 22–32)
Calcium: 9.8 mg/dL (ref 8.9–10.3)
Chloride: 113 mmol/L — ABNORMAL HIGH (ref 98–111)
Creatinine, Ser: 0.7 mg/dL (ref 0.61–1.24)
GFR, Estimated: >60 mL/min (ref >60)
Glucose, Bld: 87 mg/dL (ref 70–99)
Potassium: 3.6 mmol/L (ref 3.5–5.1)
Sodium: 142 mmol/L (ref 135–145)
Total Bilirubin: 20 mg/dL (ref 0.0–1.2)
Total Protein: 6.1 g/dL — ABNORMAL LOW (ref 6.5–8.1)

## 2023-08-20 LAB — MAGNESIUM: Magnesium: 1.8 mg/dL (ref 1.7–2.4)

## 2023-08-20 MED ORDER — FOLIC ACID 1 MG PO TABS: 1.0000 mg | ORAL_TABLET | Freq: Every day | ORAL | 0 refills | Status: DC

## 2023-08-20 MED ORDER — POLYETHYLENE GLYCOL 3350 17 G PO PACK: 17.0000 g | PACK | Freq: Every day | ORAL | 0 refills | Status: DC | PRN

## 2023-08-20 MED ORDER — MIDODRINE HCL 10 MG PO TABS: 10.0000 mg | ORAL_TABLET | Freq: Three times a day (TID) | ORAL | 0 refills | Status: DC

## 2023-08-20 MED ORDER — PREDNISOLONE 5 MG PO TABS: 40.0000 mg | ORAL_TABLET | Freq: Every day | ORAL | 0 refills | Status: DC

## 2023-08-20 MED ORDER — LACTULOSE 10 GM/15ML PO SOLN: 20.0000 g | Freq: Two times a day (BID) | ORAL | 0 refills | Status: DC

## 2023-08-20 MED ORDER — SUCRALFATE 1 GM/10ML PO SUSP: 1.0000 g | Freq: Three times a day (TID) | ORAL | 0 refills | Status: DC

## 2023-08-20 MED ORDER — VITAMIN B-1 100 MG PO TABS: 100.0000 mg | ORAL_TABLET | Freq: Every day | ORAL | 0 refills | Status: DC

## 2023-08-20 MED ORDER — ADULT MULTIVITAMIN W/MINERALS CH: 1.0000 | ORAL_TABLET | Freq: Every day | ORAL | Status: DC

## 2023-08-20 MED ORDER — PANTOPRAZOLE SODIUM 40 MG PO TBEC: 40.0000 mg | DELAYED_RELEASE_TABLET | Freq: Two times a day (BID) | ORAL | 1 refills | Status: DC

## 2023-08-20 NOTE — Discharge Summary (Signed)
 Physician Discharge Summary  Kevin Velez ZOX:[id number] DOB: [birth year] DOA: 08/08/2023  PCP: Patient, No Pcp Per  Admit date: 08/08/2023 Discharge date: 08/20/2023  Admitted From: Home Disposition: SNF  Recommendations for Outpatient Follow-up:  Follow up with SNF provider at earliest convenience with repeat CBC/CMP Outpatient follow-up with GI Comply with medications and follow-up Follow up in ED if symptoms worsen or new appear   Home Health: No Equipment/Devices: None  Discharge Condition: Stable CODE STATUS: Full Diet recommendation: Heart healthy/dysphagia 2 diet as per SLP  Brief/Interim Summary: 73 years old male with unknown past medical history who lives in a group home presented in the ED with several days history of worsening confusion, inability to get out of bed, weakness. On presentation, he was encephalopathic with hemoglobin of 3, 5 and acute hepatic failure with elevated INR and LFTs with AKI. He was intubated in the ED and admitted to ICU under PCCM service. GI was consulted. He was found to have cirrhosis of liver on ultrasound. He was initially treated with NAC infusion which was subsequently discontinued. He was also started on prednisolone  for possible asthmatic hepatitis and Zosyn  for possible infectious cause including biliary obstruction. He received packed red cells transfusion. He was extubated on 08/10/2023. Had diagnostic paracentesis done on 08/10/2023 which was negative for SBP. He was transferred back to ICU with hematochezia and shock on 08/14/2023. He had EGD on 08/14/2023 which showed large duodenal ulcer, visible vessel with active bleeding treated with epinephrine  injection, cautery, small nonbleeding esophageal varices. He required 2 more units of packed red cells along with platelets and FFP on 08/15/2023 for further bleeding. He needed Levophed  for pressor support as well. Subsequently, Levophed  has been discontinued. He has been transferred back to [facility]  service from 08/18/2023 onwards. PT recommending SNF placement.  He has subsequently remained stable.  GI has signed off.  He will be discharged to SNF once bed is available.  Discharge Diagnoses:   Hemorrhagic shock Acute blood loss anemia -Patient has required multiple units of packed red cell transfusion as well as platelets, cryoprecipitate and FFP transfusion as well as vitamin K during this hospitalization. - There was a concern for underlying infection causing shock and he has completed 7-day course of Zosyn  although cultures have been negative so far and no source was identified.  Echo showed intact LV function -Blood pressure on the lower side but stable.  Continue midodrine .  Currently on Rocephin  for SBP prophylaxis. - Hemoglobin 9.5 this morning.  Last blood transfusion was on 08/15/2023. -Currently medically stable for discharge to SNF.  Discharge to SNF once bed is available.  Outpatient follow-up with GI.   Acute GI bleeding, bleeding duodenal ulcer with visible vessel - Had EGD on 08/14/2023 which showed showed large duodenal ulcer, visible vessel with active bleeding treated with epinephrine  injection, cautery, small nonbleeding esophageal varices.  Continued to have bleeding afterwards with melena.  Repeat CTA of abdomen on 08/15/2023 showed no further bleeding. - No bleeding reported recently.  GI recommending outpatient follow-up with GI: Recommended PPI twice a day for at least 2 months and Carafate  4 times a day for 2 weeks.  GI has signed off.   Cirrhosis of liver with ascites, anasarca and pleural effusion Alcoholic hepatitis Elevated LFTs and total bilirubin Small esophageal varices Thrombocytopenia Hepatic encephalopathy Coagulopathy  -Completed N-acetylcysteine  treatment.  Currently on Rocephin  for SBP prophylaxis.  LFTs improving but bilirubin is still high at 20 today.  Monitor LFTs as an outpatient.  Continue  prednisolone  for 4 weeks (prednisolone  is cost prohibitive,  prednisone same dose can be used for 4 weeks total; prednisone started on 08/12/2023).  Continue thiamine , multivitamin, folic acid .  Continue lactulose .  Outpatient follow-up with GI. -monitor platelets as an outpatient   Leukocytosis - Monitor intermittently as an outpatient   Hypokalemia - Improved   AKI - Resolved   Acute metabolic acidosis - Mild.  Encourage oral intake.   Suspect alcohol induced pancreatitis - No reported evidence of obstruction in the biliary system.  Currently tolerating diet   Severe protein calorie malnutrition - Follow nutrition recommendations.   Physical deconditioning - Will need SNF placement.    Discharge Instructions  Discharge Instructions     Diet - low sodium heart healthy   Complete by: As directed    Dysphagia 2 diet   Increase activity slowly   Complete by: As directed       Allergies as of 08/20/2023   No Known Allergies      Medication List     STOP taking these medications    aspirin EC 325 MG tablet       TAKE these medications    ascorbic acid 500 MG tablet Commonly known as: VITAMIN C Take 500-1,000 mg by mouth daily.   cyanocobalamin 1000 MCG tablet Commonly known as: VITAMIN B12 Take 1,000 mcg by mouth daily.   folic acid  1 MG tablet Commonly known as: FOLVITE  Take 1 tablet (1 mg total) by mouth daily. Start taking on: Aug 21, 2023   lactulose  10 GM/15ML solution Commonly known as: CHRONULAC  Take 30 mLs (20 g total) by mouth 2 (two) times daily.   midodrine  10 MG tablet Commonly known as: PROAMATINE  Take 1 tablet (10 mg total) by mouth 3 (three) times daily with meals.   multivitamin with minerals Tabs tablet Take 1 tablet by mouth daily. Start taking on: Aug 21, 2023   pantoprazole  40 MG tablet Commonly known as: Protonix  Take 1 tablet (40 mg total) by mouth 2 (two) times daily before a meal.   polyethylene glycol 17 g packet Commonly known as: MIRALAX  / GLYCOLAX  Take 17 g by mouth daily as  needed for moderate constipation.   prednisoLONE  5 MG Tabs tablet Take 8 tablets (40 mg total) by mouth daily for 16 days. Start taking on: Aug 21, 2023   sucralfate  1 GM/10ML suspension Commonly known as: CARAFATE  Take 10 mLs (1 g total) by mouth 4 (four) times daily -  with meals and at bedtime.   thiamine  100 MG tablet Commonly known as: Vitamin B-1 Take 1 tablet (100 mg total) by mouth daily. Start taking on: Aug 21, 2023        Contact information for follow-up providers     Brahmbhatt, Manuel Sell, MD. Schedule an appointment as soon as possible for a visit in 1 week(s).   Specialty: Gastroenterology Contact information: [address] [place] [address] [phone]              Contact information for after-discharge care     Destination     [facility] Preferred SNF .   Service: Skilled Nursing Contact information: [address] [place]  [address] [phone]                    No Known Allergies  Consultations: GI/PCCM   Procedures/Studies: CT Angio Abd/Pel w/ and/or w/o Result Date: 08/15/2023 CLINICAL DATA:  Lower gastrointestinal hemorrhage EXAM: CT ANGIOGRAPHY ABDOMEN AND  PELVIS WITH CONTRAST AND WITHOUT CONTRAST TECHNIQUE: Multidetector CT imaging of the abdomen and pelvis was performed using the standard protocol during bolus administration of intravenous contrast. Multiplanar reconstructed images and MIPs were obtained and reviewed to evaluate the vascular anatomy. RADIATION DOSE REDUCTION: This exam was performed according to the departmental dose-optimization program which includes automated exposure control, adjustment of the mA and/or kV according to patient size and/or use of iterative reconstruction technique. CONTRAST:  OMNIPAQUE  IOHEXOL  350 MG/ML SOLN COMPARISON:  08/10/2023 FINDINGS: VASCULAR Aorta: Normal caliber aorta without aneurysm, dissection, vasculitis or  significant stenosis. Mild atherosclerotic calcification Celiac: Patent without evidence of aneurysm, dissection, vasculitis or significant stenosis. SMA: Patent without evidence of aneurysm, dissection, vasculitis or significant stenosis. Renals: Single renal arteries are seen bilaterally demonstrating wide patency. There is a beaded appearance involving the mid segment of the renal arteries bilaterally in keeping with changes of fibromuscular dysplasia. No dissection or aneurysm. IMA: Patent without evidence of aneurysm, dissection, vasculitis or significant stenosis. Inflow: Patent without evidence of aneurysm, dissection, vasculitis or significant stenosis. Proximal Outflow: Bilateral common femoral and visualized portions of the superficial and profunda femoral arteries are patent without evidence of aneurysm, dissection, vasculitis or significant stenosis. Veins: No obvious venous abnormality within the limitations of this arterial phase study. Review of the MIP images confirms the above findings. NON-VASCULAR Lower chest: Trace right and small left pleural effusions with associated bibasilar atelectasis. Mild emphysema. No acute abnormality. Hepatobiliary: Mildly nodular liver contour suggests changes of underlying cirrhosis. Superimposed mild hepatic steatosis. No enhancing intrahepatic mass. No intra or extrahepatic biliary ductal dilation. Cholelithiasis noted without superimposed pericholecystic inflammatory change. Pancreas: Unremarkable Spleen: Unremarkable Adrenals/Urinary Tract: Adrenal glands are unremarkable. Simple cortical cysts are seen within the kidneys bilaterally for which no follow-up imaging is recommended. The kidneys are otherwise unremarkable. Bladder unremarkable. Stomach/Bowel: Stable mild ascites. No active gastrointestinal hemorrhage. Moderate ascending and descending colonic diverticulosis. Stomach, small bowel, and large are otherwise unremarkable. Appendix normal. No free  intraperitoneal gas. Lymphatic: No pathologic adenopathy within the abdomen and pelvis. Reproductive: Prostate is unremarkable. Other: Moderate, progressive subcutaneous body wall edema, best appreciated within flanks bilaterally. Mild retroperitoneal edema again noted diffusely. Musculoskeletal: No acute bone abnormality. No lytic or blastic bone lesion. Osseous structures are age appropriate. IMPRESSION: 1. No active gastrointestinal hemorrhage. 2. Beaded appearance of the renal arteries bilaterally in keeping with changes of fibromuscular dysplasia. 3. Progressive anasarca with progressive subcutaneous body wall edema, mild retroperitoneal edema, and small left and trace right pleural effusions. Mild ascites may reflect combination anasarca as well as portal venous hypertension. 4. Suspected cirrhosis. Superimposed hepatic steatosis. If indicated, this could be further assessed with hepatic elastography or trans venous tissue sampling. 5. Cholelithiasis. 6. Moderate ascending and descending colonic diverticulosis. Aortic Atherosclerosis (ICD10-I70.0). Electronically Signed   By: Worthy Heads M.D.   On: 08/15/2023 01:42   US  EKG SITE RITE Result Date: 08/14/2023 If Site Rite image not attached, placement could not be confirmed due to current cardiac rhythm.  DG Abd 1 View Result Date: 08/10/2023 CLINICAL DATA:  Abdominal pain EXAM: ABDOMEN - 1 VIEW COMPARISON:  October 03, 2009 FINDINGS: Nonobstructive bowel gas pattern. Enteric contrast in the proximal colon and rectum. Temperature probe overlying the pelvis. No pneumoperitoneum. No organomegaly or radiopaque calculi. No acute fracture or destructive lesion. Multilevel degenerative disc disease of the spine. Streaky atelectasis in the lung bases. IMPRESSION: Nonobstructive bowel gas pattern. Electronically Signed   By: Rance Burrows M.D.   On: 08/10/2023  16:29   CT ABDOMEN PELVIS WO CONTRAST Result Date: 08/10/2023 CLINICAL DATA:  Increasing weakness.  EXAM: CT ABDOMEN AND PELVIS WITHOUT CONTRAST TECHNIQUE: Multidetector CT imaging of the abdomen and pelvis was performed following the standard protocol without IV contrast. RADIATION DOSE REDUCTION: This exam was performed according to the departmental dose-optimization program which includes automated exposure control, adjustment of the mA and/or kV according to patient size and/or use of iterative reconstruction technique. COMPARISON:  None Available. FINDINGS: Lower chest: Mild to moderate severity posterior right basilar atelectasis and/or infiltrate is seen. Small bilateral pleural effusions are noted. Hepatobiliary: The liver is cirrhotic in appearance. A tiny gallstone is seen within the dependent portion of a mildly distended gallbladder. There is no evidence of gallbladder wall thickening, pericholecystic inflammation or biliary dilatation. Pancreas: Ill-defined and linear inflammatory fat stranding is seen along the posterior aspect of the tail of the pancreas and, to a lesser degree, along the medial aspect of the pancreatic head. Moderate to marked severity bilateral retro mesenteric fat stranding is also seen extending along the anterior aspects of both kidneys into the lower abdomen. The pancreatic duct measures approximately 4.3 mm in diameter. Spleen: Normal in size without focal abnormality. Adrenals/Urinary Tract: Adrenal glands are unremarkable. Kidneys are normal in size, without renal calculi or hydronephrosis. Bilateral renal cysts are seen. A Foley catheter is seen within a predominantly empty urinary bladder. Stomach/Bowel: A properly position nasogastric tube is in place. Stomach is within normal limits. Appendix appears normal. No evidence of bowel dilatation. The cecum and ascending colon are mildly thickened and inflamed. Numerous noninflamed diverticula are seen throughout the large bowel. Vascular/Lymphatic: Aortic atherosclerosis. A venous catheter is seen entering via the right  groin. No enlarged abdominal or pelvic lymph nodes. Reproductive: The prostate gland is mildly enlarged. Other: No abdominal wall hernia or abnormality. There is a moderate amount of abdominopelvic ascites. Musculoskeletal: No acute or significant osseous findings. IMPRESSION: 1. Extensive retro-mesenteric inflammation which may represent sequelae associated with retroperitoneal fasciitis. Alternate inflammatory processes such as acute pancreatitis cannot be excluded. 2. Mild colitis involving the cecum and ascending colon. 3. Colonic diverticulosis. 4. Moderate amount of abdominopelvic ascites. 5. Mild to moderate severity posterior right basilar atelectasis and/or infiltrate. 6. Small bilateral pleural effusions. 7. Bilateral renal cysts. No follow-up imaging is recommended. This recommendation follows ACR consensus guidelines: Management of the Incidental Renal Mass on CT: A White Paper of the ACR Incidental Findings Committee. J Am Coll Radiol [phone]. Electronically Signed   By: Virgle Grime M.D.   On: 08/10/2023 03:07   ECHOCARDIOGRAM COMPLETE Result Date: 08/09/2023    ECHOCARDIOGRAM REPORT   Patient Name:   Kevin Velez Date of Exam: 08/09/2023 Medical Rec #:  [id number]       Height:       69.0 in Accession #:    [id number]      Weight:       113.8 lb Date of Birth:  1950/06/21        BSA:          1.625 m Patient Age:    73 years        BP:           110/56 mmHg Patient Gender: M               HR:           98 bpm. Exam Location:  Inpatient Procedure: 2D Echo, Cardiac Doppler, Color Doppler and Intracardiac  Opacification Agent (Both Spectral and Color Flow Doppler were            utilized during procedure). Indications:    Liver Failure  History:        Patient has no prior history of Echocardiogram examinations.  Sonographer:    Andrena Bang Referring Phys: [phone] MATTHEW R HUNSUCKER  Sonographer Comments: Technically difficult study due to poor echo windows. IMPRESSIONS  1. Left  ventricular ejection fraction, by estimation, is >75%. The left ventricle has hyperdynamic function. The left ventricle has no regional wall motion abnormalities. Left ventricular diastolic parameters were normal.  2. Right ventricular systolic function is normal. The right ventricular size is grossly normal.  3. Right atrial size was grossly normal.  4. The mitral valve is grossly normal. Mild mitral valve regurgitation. No evidence of mitral stenosis.  5. The aortic valve was not well visualized. Aortic valve regurgitation is not visualized. No aortic stenosis is present.  6. The inferior vena cava is normal in size with <50% respiratory variability, suggesting right atrial pressure of 8 mmHg. Comparison(s): No prior Echocardiogram. FINDINGS  Left Ventricle: Left ventricular ejection fraction, by estimation, is >75%. The left ventricle has hyperdynamic function. The left ventricle has no regional wall motion abnormalities. Definity  contrast agent was given IV to delineate the left ventricular endocardial borders. The left ventricular internal cavity size was normal in size. Suboptimal image quality limits for assessment of left ventricular hypertrophy. Left ventricular diastolic parameters were normal. Right Ventricle: The right ventricular size is grossly normal. Right vetricular wall thickness was not well visualized. Right ventricular systolic function is normal. Left Atrium: Left atrial size was normal in size. Right Atrium: Right atrial size was grossly normal. Pericardium: There is no evidence of pericardial effusion. Mitral Valve: The mitral valve is grossly normal. Mild mitral valve regurgitation. No evidence of mitral valve stenosis. Tricuspid Valve: The tricuspid valve is not well visualized. Tricuspid valve regurgitation is trivial. No evidence of tricuspid stenosis. Aortic Valve: The aortic valve was not well visualized. Aortic valve regurgitation is not visualized. No aortic stenosis is present.  Aortic valve mean gradient measures 4.0 mmHg. Aortic valve peak gradient measures 11.3 mmHg. Aortic valve area, by VTI measures 2.09 cm. Pulmonic Valve: The pulmonic valve was not well visualized. Pulmonic valve regurgitation is not visualized. No evidence of pulmonic stenosis. Aorta: Aortic root could not be assessed. Venous: The inferior vena cava is normal in size with less than 50% respiratory variability, suggesting right atrial pressure of 8 mmHg. IAS/Shunts: The interatrial septum was not well visualized.  LEFT VENTRICLE PLAX 2D LVOT diam:     1.70 cm     Diastology LV SV:         45          LV e' medial:    7.72 cm/s LV SV Index:   28          LV E/e' medial:  8.4 LVOT Area:     2.27 cm    LV e' lateral:   10.80 cm/s                            LV E/e' lateral: 6.0  LV Volumes (MOD) LV vol d, MOD A2C: 89.6 ml LV vol d, MOD A4C: 95.6 ml LV vol s, MOD A2C: 15.8 ml LV vol s, MOD A4C: 20.8 ml LV SV MOD A2C:     73.8 ml LV SV MOD A4C:  95.6 ml LV SV MOD BP:      76.7 ml RIGHT VENTRICLE RV S prime:     16.50 cm/s TAPSE (M-mode): 1.9 cm LEFT ATRIUM             Index LA Vol (A2C):   31.5 ml 19.38 ml/m LA Vol (A4C):   25.3 ml 15.56 ml/m LA Biplane Vol: 30.0 ml 18.46 ml/m  AORTIC VALVE AV Area (Vmax):    1.85 cm AV Area (Vmean):   2.06 cm AV Area (VTI):     2.09 cm AV Vmax:           168.00 cm/s AV Vmean:          88.000 cm/s AV VTI:            0.214 m AV Peak Grad:      11.3 mmHg AV Mean Grad:      4.0 mmHg LVOT Vmax:         137.00 cm/s LVOT Vmean:        80.000 cm/s LVOT VTI:          0.197 m LVOT/AV VTI ratio: 0.92 MITRAL VALVE MV Area (PHT): 4.04 cm    SHUNTS MV Decel Time: 188 msec    Systemic VTI:  0.20 m MV E velocity: 65.10 cm/s  Systemic Diam: 1.70 cm MV A velocity: 88.10 cm/s MV E/A ratio:  0.74 Sunit Tolia Electronically signed by Olinda Bertrand Signature Date/Time: 08/09/2023/12:44:00 PM    Final    US  Abdomen Limited RUQ (LIVER/GB) Result Date: 08/09/2023 CLINICAL DATA:  Liver failure EXAM:  ULTRASOUND ABDOMEN LIMITED RIGHT UPPER QUADRANT COMPARISON:  None Available. FINDINGS: Gallbladder: Gallbladder is well distended with gallbladder sludge. No definitive stones are seen. Mild wall thickening of 4 mm is noted related to underlying ascites. Common bile duct: Diameter: 6.8 mm Liver: Nodularity is noted with increased echogenicity consistent with underlying cirrhosis. Portal vein is patent on color Doppler imaging with normal direction of blood flow towards the liver. Other: Mild ascites is noted. IMPRESSION: Changes of hepatic cirrhosis with associated ascites. Gallbladder sludge without evidence of cholelithiasis. Electronically Signed   By: Violeta Grey M.D.   On: 08/09/2023 10:44   DG Chest Port 1 View Result Date: 08/08/2023 CLINICAL DATA:  Questionable sepsis - evaluate for abnormality EXAM: PORTABLE CHEST 1 VIEW COMPARISON:  None Available. FINDINGS: Overlying artifact limits assessment. Endotracheal tube tip is 4.4 cm from the carina. Tip of the enteric tube is below the diaphragm in the stomach, side-port just beyond the gastroesophageal junction. The heart is normal in size. Mediastinal contours are normal. There may be small pleural effusions. No pulmonary edema, confluent airspace disease or pneumothorax. IMPRESSION: 1. Endotracheal tube tip 4.4 cm from the carina. 2. Enteric tube tip below the diaphragm in the stomach, side-port just beyond the gastroesophageal junction. 3. Possible small pleural effusions. Electronically Signed   By: Chadwick Colonel M.D.   On: 08/08/2023 18:33      Subjective: Patient seen and examined at bedside.  Poor historian.  No fever, agitation, seizures reported.    Discharge Exam: Vitals:   08/20/23 0010 08/20/23 0330  BP: 112/74 115/74  Pulse: (!) 101 98  Resp: 20 18  Temp: 98 F (36.7 C) 98 F (36.7 C)  SpO2: 97% 97%    General: No acute distress.  Remains on room air.  Looks chronic and deconditioned. ENT/neck: No thyromegaly.  JVD is  not elevated  respiratory: Bilateral decreased breath sounds at bases  with scattered crackles  CVS: Rate mostly controlled; S1 and S2 are heard  abdominal: Soft, nontender, distended mildly; no organomegaly, bowel sounds are heard normally Extremities: No clubbing; bilateral lower extremity edema present  CNS: Alert and awake.  Remains slow to respond and a poor historian.  No obvious focal neurologic deficit.   Lymph: No obvious palpable lymphadenopathy Skin: No obvious petechia/rashes  psych: Currently not agitated.  Extremely flat affect. musculoskeletal: No obvious joint tenderness/erythema    The results of significant diagnostics from this hospitalization (including imaging, microbiology, ancillary and laboratory) are listed below for reference.     Microbiology: Recent Results (from the past 240 hours)  Body fluid culture w Gram Stain     Status: None   Collection Time: 08/10/23 12:18 PM   Specimen: Peritoneal Cavity; Peritoneal Fluid  Result Value Ref Range Status   Specimen Description   Final    PERITONEAL CAVITY Performed at [facility], [address], [place], [place] [address]    Special Requests   Final    NONE Performed at [facility], [address], [place], [place] [address]    Gram Stain   Final    WBC PRESENT, PREDOMINANTLY PMN NO ORGANISMS SEEN CYTOSPIN SMEAR    Culture   Final    NO GROWTH 3 DAYS Performed at [facility], [address] [place], [place] [address]    Report Status 08/14/2023 FINAL  Final  MRSA Next Gen by PCR, Nasal     Status: None   Collection Time: 08/14/23  5:10 AM   Specimen: Nasal Mucosa; Nasal Swab  Result Value Ref Range Status   MRSA by PCR Next Gen NOT DETECTED NOT DETECTED Final    Comment: (NOTE) The GeneXpert MRSA Assay (FDA approved for NASAL specimens only), is one component of a comprehensive MRSA colonization surveillance program. It is not intended to diagnose MRSA  infection nor to guide or monitor treatment for MRSA infections. Test performance is not FDA approved in patients less than 63 years old. Performed at [facility], [address], [place], [place] [address]      Labs: BNP (last 3 results) No results for input(s): "BNP" in the last 8760 hours. Basic Metabolic Panel: Recent Labs  Lab 08/14/23 0055 08/15/23 0519 08/16/23 0335 08/17/23 0155 08/19/23 0522 08/20/23 0338  NA 145 139 144 141 141 142  K 3.7 3.3* 3.2* 3.9 3.2* 3.6  CL 116* 105 114* 111 113* 113*  CO2 19* 14* 21* 19* 20* 19*  GLUCOSE 145* 235* 100* 145* 115* 87  BUN 40* 42* 46* 40* 35* 30*  CREATININE 0.91 1.45* 1.31* 0.80 0.79 0.70  CALCIUM 8.7* 7.2* 8.7* 8.9 9.3 9.8  MG 1.9 1.6* 2.2 2.0 1.8 1.8  PHOS 1.9* 9.8* 3.8 3.8  --   --    Liver Function Tests: Recent Labs  Lab 08/15/23 0519 08/16/23 0335 08/17/23 0155 08/19/23 0522 08/20/23 0338  AST 309* 263* 155* 110* 115*  ALT 128* 150* 118* 105* 113*  ALKPHOS 31* 50 63 81 88  BILITOT 15.5* 19.2* 19.4* 18.7* 20.0*  PROT 4.3* 5.6* 5.8* 6.0* 6.1*  ALBUMIN  2.2* 2.5* 2.4* 2.3* 2.4*   Recent Labs  Lab 08/15/23 0519  LIPASE 117*   Recent Labs  Lab 08/15/23 0519  AMMONIA 63*   CBC: Recent Labs  Lab 08/15/23 0519 08/15/23 2956 08/16/23 0335 08/16/23 1136 08/17/23 0744 08/17/23 1351 08/17/23 2358 08/18/23 2130 08/18/23 1228 08/20/23 8657  WBC 15.8*  --  18.7*  --  17.4*  --   --   --  15.7* 16.1*  NEUTROABS  --   --  15.3*  --  14.7*  --   --   --  13.8* 14.0*  HGB 8.1*   < > 8.2*   < > 7.9* 7.4* 9.0* 9.1* 9.2* 9.5*  HCT 23.5*   < > 23.6*   < > 23.5* 22.0* 26.2* 27.0* 28.2* 28.6*  MCV 84.5  --  84.3  --  87.0  --   --   --  91.0 88.8  PLT 98*  --  51*  --  95*  --   --   --  111* 119*   < > = values in this interval not displayed.   Cardiac Enzymes: No results for input(s): "CKTOTAL", "CKMB", "CKMBINDEX", "TROPONINI" in the last 168 hours. BNP: Invalid input(s):  "POCBNP" CBG: Recent Labs  Lab 08/18/23 1157 08/18/23 1604 08/19/23 0806 08/19/23 1144 08/19/23 1704  GLUCAP 120* 133* 103* 157* 108*   D-Dimer No results for input(s): "DDIMER" in the last 72 hours. Hgb A1c No results for input(s): "HGBA1C" in the last 72 hours. Lipid Profile No results for input(s): "CHOL", "HDL", "LDLCALC", "TRIG", "CHOLHDL", "LDLDIRECT" in the last 72 hours. Thyroid function studies No results for input(s): "TSH", "T4TOTAL", "T3FREE", "THYROIDAB" in the last 72 hours.  Invalid input(s): "FREET3" Anemia work up No results for input(s): "VITAMINB12", "FOLATE", "FERRITIN", "TIBC", "IRON", "RETICCTPCT" in the last 72 hours. Urinalysis    Component Value Date/Time   COLORURINE AMBER (A) 08/08/2023 1640   APPEARANCEUR CLEAR 08/08/2023 1640   LABSPEC 1.014 08/08/2023 1640   PHURINE 5.0 08/08/2023 1640   GLUCOSEU NEGATIVE 08/08/2023 1640   HGBUR MODERATE (A) 08/08/2023 1640   BILIRUBINUR SMALL (A) 08/08/2023 1640   KETONESUR NEGATIVE 08/08/2023 1640   PROTEINUR NEGATIVE 08/08/2023 1640   NITRITE NEGATIVE 08/08/2023 1640   LEUKOCYTESUR NEGATIVE 08/08/2023 1640   Sepsis Labs Recent Labs  Lab 08/16/23 0335 08/17/23 0744 08/18/23 1228 08/20/23 0338  WBC 18.7* 17.4* 15.7* 16.1*   Microbiology Recent Results (from the past 240 hours)  Body fluid culture w Gram Stain     Status: None   Collection Time: 08/10/23 12:18 PM   Specimen: Peritoneal Cavity; Peritoneal Fluid  Result Value Ref Range Status   Specimen Description   Final    PERITONEAL CAVITY Performed at [facility], [address], [place], [place] [address]    Special Requests   Final    NONE Performed at [facility], [address] [place], [place] [address]    Gram Stain   Final    WBC PRESENT, PREDOMINANTLY PMN NO ORGANISMS SEEN CYTOSPIN SMEAR    Culture   Final    NO GROWTH 3 DAYS Performed at [facility], [address]  [place], [place] [address]    Report Status 08/14/2023 FINAL  Final  MRSA Next Gen by PCR, Nasal     Status: None   Collection Time: 08/14/23  5:10 AM   Specimen: Nasal Mucosa; Nasal Swab  Result Value Ref Range Status   MRSA by PCR Next Gen NOT DETECTED NOT DETECTED Final    Comment: (NOTE) The GeneXpert MRSA Assay (FDA approved for NASAL specimens only), is one component of a comprehensive MRSA colonization surveillance program. It is not intended to diagnose MRSA infection nor to guide or monitor treatment for MRSA infections. Test performance is not  FDA approved in patients less than 22 years old. Performed at [facility], [address] [place], [place] [address]      Time coordinating discharge: 35 minutes  SIGNED:   Audria Leather, MD  [facility] 08/20/2023, 11:03 AM

## 2023-08-20 NOTE — Progress Notes (Signed)
PICC line was removed per order with no complications.  Pt supine and suspended inspiration during removal with instructions to remain supine for 30 minutes after removal.  Pressure held to achieve hemostasis.  Vaseline/gauze/tegaderm applied.  Patient education provided regarding lifting restrictions, site care, and signs of infection.  Pt verbalized understanding and had no questions.

## 2023-08-20 NOTE — TOC Transition Note (Signed)
 Transition of Care [facility]) - Discharge Note   Patient Details  Name: Kevin Velez MRN: [id number] Date of Birth: [birth year]  Transition of Care [facility]) CM/SW Contact:  Gertha Ku, LCSW Phone Number: 08/20/2023, 10:40 AM   Clinical Narrative:    Pt's insurance [employer] was approved for [employer]it ID: [id number] 08/20/23-08/23/23. Pt room will be 207, RN to call report to [phone]. PTAR called TOC sign off.    Final next level of care: Skilled Nursing Facility Barriers to Discharge: Barriers Resolved   Patient Goals and CMS Choice Patient states their goals for this hospitalization and ongoing recovery are:: SNF to get stonger          Discharge Placement              Patient chooses bed at: [facility] Patient to be transferred to facility by: [facility]   Patient and family notified of of transfer: 08/20/23  Discharge Plan and Services Additional resources added to the After Visit Summary for   In-house Referral: Clinical Social Work              DME Arranged: N/A DME Agency: NA                  Social Drivers of Health (SDOH) Interventions SDOH Screenings   Food Insecurity: Patient Unable To Answer (08/09/2023)  Housing: Patient Unable To Answer (08/09/2023)  Transportation Needs: Patient Unable To Answer (08/09/2023)  Utilities: Patient Unable To Answer (08/09/2023)  Social Connections: Patient Unable To Answer (08/09/2023)  Tobacco Use: High Risk (08/14/2023)     Readmission Risk Interventions    08/11/2023    2:18 PM  Readmission Risk Prevention Plan  Transportation Screening Complete  HRI or Home Care Consult Complete  Social Work Consult for Recovery Care Planning/Counseling Complete  Palliative Care Screening Not Applicable  Medication Review [employer]) Complete

## 2023-08-20 NOTE — Progress Notes (Signed)
 PROGRESS NOTE    Kevin Velez  ZOX:[id number] DOB: 1951/01/22 DOA: 08/08/2023 PCP: Patient, No Pcp Per   Brief Narrative:  73 years old male with unknown past medical history who lives in a group home presented in the ED with several days history of worsening confusion, inability to get out of bed, weakness.  On presentation, he was encephalopathic with hemoglobin of 3, 5 and acute hepatic failure with elevated INR and LFTs with AKI.  He was intubated in the ED and admitted to ICU under PCCM service.  GI was consulted.  He was found to have cirrhosis of liver on ultrasound.  He was initially treated with NAC infusion which was subsequently discontinued.  He was also started on prednisolone  for possible asthmatic hepatitis and Zosyn  for possible infectious cause including biliary obstruction.  He received packed red cells transfusion.  He was extubated on 08/10/2023.  Had diagnostic paracentesis done on 08/10/2023 which was negative for SBP.  He was transferred back to ICU with hematochezia and shock on 08/14/2023.  He had EGD on 08/14/2023 which showed large duodenal ulcer, visible vessel with active bleeding treated with epinephrine  injection, cautery, small nonbleeding esophageal varices.  He required 2 more units of packed red cells along with platelets and FFP on 08/15/2023 for further bleeding.  He needed Levophed  for pressor support as well.  Subsequently, Levophed  has been discontinued.  He has been transferred back to [facility] service from 08/18/2023 onwards.  PT recommending SNF placement.  Assessment & Plan:   Hemorrhagic shock Acute blood loss anemia -Patient has required multiple units of packed red cell transfusion as well as platelets, cryoprecipitate and FFP transfusion as well as vitamin K during this hospitalization. - There was a concern for underlying infection causing shock and he has completed 7-day course of Zosyn  although cultures have been negative so far and no source was identified.   Echo showed intact LV function -Blood pressure on the lower side but stable.  Continue midodrine .  Currently on Rocephin  for SBP prophylaxis. - Hemoglobin 9.5 this morning.  Continue to monitor H&H.  Last blood transfusion was on 08/15/2023.  Acute GI bleeding, bleeding duodenal ulcer with visible vessel - Had EGD on 08/14/2023 which showed showed large duodenal ulcer, visible vessel with active bleeding treated with epinephrine  injection, cautery, small nonbleeding esophageal varices.  Continued to have bleeding afterwards with melena.  Repeat CTA of abdomen on 08/15/2023 showed no further bleeding. - No bleeding reported overnight.  GI recommending outpatient follow-up with GI.  Continue PPI and Carafate .  Cirrhosis of liver with ascites, anasarca and pleural effusion Alcoholic hepatitis Elevated LFTs and total bilirubin Small esophageal varices Thrombocytopenia Hepatic encephalopathy Coagulopathy  -Completed N-acetylcysteine  treatment.  Continue Rocephin  for SBP prophylaxis.  LFTs improving but bilirubin is still high at 20 today.  Monitor LFTs.  Continue prednisolone  for 4 weeks.  Continue thiamine , multivitamin, folic acid .  Continue lactulose  -monitor platelets  Leukocytosis - Monitor  Hypokalemia - Improved  AKI - Resolved  Acute metabolic acidosis - Monitor.  Encourage oral intake.  Suspect alcohol induced pancreatitis - No reported evidence of obstruction in the biliary system.  GI following.  Diet advancement as per GI.  Severe protein calorie malnutrition - Follow nutrition recommendations.  Physical deconditioning - Will need SNF placement.  TOC following.   DVT prophylaxis: SCDs Code Status: Full Family Communication: Brother at bedside Disposition Plan: Status is: Inpatient Remains inpatient appropriate because: Of severity of illness.  Need for SNF placement.  Currently  medically stable for discharge to SNF    Consultants: GI/PCCM/  Procedures: As  above  Antimicrobials:  Anti-infectives (From admission, onward)    Start     Dose/Rate Route Frequency Ordered Stop   08/17/23 1115  cefTRIAXone  (ROCEPHIN ) 1 g in sodium chloride  0.9 % 100 mL IVPB        1 g 200 mL/hr over 30 Minutes Intravenous Every 24 hours 08/17/23 1020     08/09/23 1800  piperacillin -tazobactam (ZOSYN ) IVPB 3.375 g  Status:  Discontinued        3.375 g 12.5 mL/hr over 240 Minutes Intravenous Every 8 hours 08/09/23 1315 08/15/23 1126   08/09/23 1000  piperacillin -tazobactam (ZOSYN ) IVPB 3.375 g  Status:  Discontinued        3.375 g 12.5 mL/hr over 240 Minutes Intravenous Every 12 hours 08/08/23 2023 08/09/23 1315   08/08/23 1345  ceFEPIme  (MAXIPIME ) 2 g in sodium chloride  0.9 % 100 mL IVPB        2 g 200 mL/hr over 30 Minutes Intravenous  Once 08/08/23 1336 08/08/23 1638   08/08/23 1345  metroNIDAZOLE  (FLAGYL ) IVPB 500 mg        500 mg 100 mL/hr over 60 Minutes Intravenous  Once 08/08/23 1336 08/08/23 1801   08/08/23 1345  vancomycin  (VANCOCIN ) IVPB 1000 mg/200 mL premix        1,000 mg 200 mL/hr over 60 Minutes Intravenous  Once 08/08/23 1336 08/08/23 1801        Subjective: Patient seen and examined at bedside.  Poor historian.  No fever, agitation, seizures reported.   Objective: Vitals:   08/19/23 2200 08/20/23 0010 08/20/23 0330 08/20/23 0500  BP:  112/74 115/74   Pulse: 97 (!) 101 98   Resp: (!) 21 20 18    Temp:  98 F (36.7 C) 98 F (36.7 C)   TempSrc:  Oral Oral   SpO2: 93% 97% 97%   Weight:    60.4 kg  Height:        Intake/Output Summary (Last 24 hours) at 08/20/2023 0801 Last data filed at 08/20/2023 0500 Gross per 24 hour  Intake 199.16 ml  Output 200 ml  Net -0.84 ml   Filed Weights   08/12/23 0238 08/19/23 0500 08/20/23 0500  Weight: 55.3 kg 54.3 kg 60.4 kg    Examination:  General: No acute distress.  Remains on room air.  Looks chronic and deconditioned. ENT/neck: No thyromegaly.  JVD is not elevated  respiratory:  Bilateral decreased breath sounds at bases with scattered crackles  CVS: Rate mostly controlled; S1 and S2 are heard  abdominal: Soft, nontender, distended mildly; no organomegaly, bowel sounds are heard normally Extremities: No clubbing; bilateral lower extremity edema present  CNS: Alert and awake.  Remains slow to respond and a poor historian.  No obvious focal neurologic deficit.   Lymph: No obvious palpable lymphadenopathy Skin: No obvious petechia/rashes  psych: Currently not agitated.  Extremely flat affect. musculoskeletal: No obvious joint tenderness/erythema   Data Reviewed: I have personally reviewed following labs and imaging studies  CBC: Recent Labs  Lab 08/15/23 0519 08/15/23 0903 08/16/23 0335 08/16/23 1136 08/17/23 0744 08/17/23 1351 08/17/23 2358 08/18/23 0608 08/18/23 1228 08/20/23 0338  WBC 15.8*  --  18.7*  --  17.4*  --   --   --  15.7* 16.1*  NEUTROABS  --   --  15.3*  --  14.7*  --   --   --  13.8* 14.0*  HGB 8.1*   < >  8.2*   < > 7.9* 7.4* 9.0* 9.1* 9.2* 9.5*  HCT 23.5*   < > 23.6*   < > 23.5* 22.0* 26.2* 27.0* 28.2* 28.6*  MCV 84.5  --  84.3  --  87.0  --   --   --  91.0 88.8  PLT 98*  --  51*  --  95*  --   --   --  111* 119*   < > = values in this interval not displayed.   Basic Metabolic Panel: Recent Labs  Lab 08/14/23 0055 08/15/23 0519 08/16/23 0335 08/17/23 0155 08/19/23 0522 08/20/23 0338  NA 145 139 144 141 141 142  K 3.7 3.3* 3.2* 3.9 3.2* 3.6  CL 116* 105 114* 111 113* 113*  CO2 19* 14* 21* 19* 20* 19*  GLUCOSE 145* 235* 100* 145* 115* 87  BUN 40* 42* 46* 40* 35* 30*  CREATININE 0.91 1.45* 1.31* 0.80 0.79 0.70  CALCIUM 8.7* 7.2* 8.7* 8.9 9.3 9.8  MG 1.9 1.6* 2.2 2.0 1.8 1.8  PHOS 1.9* 9.8* 3.8 3.8  --   --    GFR: Estimated Creatinine Clearance: 72.4 mL/min (by C-G formula based on SCr of 0.7 mg/dL). Liver Function Tests: Recent Labs  Lab 08/15/23 0519 08/16/23 0335 08/17/23 0155 08/19/23 0522 08/20/23 0338  AST 309*  263* 155* 110* 115*  ALT 128* 150* 118* 105* 113*  ALKPHOS 31* 50 63 81 88  BILITOT 15.5* 19.2* 19.4* 18.7* 20.0*  PROT 4.3* 5.6* 5.8* 6.0* 6.1*  ALBUMIN  2.2* 2.5* 2.4* 2.3* 2.4*   Recent Labs  Lab 08/15/23 0519  LIPASE 117*   Recent Labs  Lab 08/15/23 0519  AMMONIA 63*   Coagulation Profile: Recent Labs  Lab 08/14/23 0055 08/15/23 0519 08/16/23 0340 08/17/23 0744  INR 2.2* 2.4* 1.9* 1.8*   Cardiac Enzymes: No results for input(s): "CKTOTAL", "CKMB", "CKMBINDEX", "TROPONINI" in the last 168 hours. BNP (last 3 results) No results for input(s): "PROBNP" in the last 8760 hours. HbA1C: No results for input(s): "HGBA1C" in the last 72 hours. CBG: Recent Labs  Lab 08/18/23 1157 08/18/23 1604 08/19/23 0806 08/19/23 1144 08/19/23 1704  GLUCAP 120* 133* 103* 157* 108*   Lipid Profile: No results for input(s): "CHOL", "HDL", "LDLCALC", "TRIG", "CHOLHDL", "LDLDIRECT" in the last 72 hours. Thyroid Function Tests: No results for input(s): "TSH", "T4TOTAL", "FREET4", "T3FREE", "THYROIDAB" in the last 72 hours. Anemia Panel: No results for input(s): "VITAMINB12", "FOLATE", "FERRITIN", "TIBC", "IRON", "RETICCTPCT" in the last 72 hours. Sepsis Labs: No results for input(s): "PROCALCITON", "LATICACIDVEN" in the last 168 hours.  Recent Results (from the past 240 hours)  Body fluid culture w Gram Stain     Status: None   Collection Time: 08/10/23 12:18 PM   Specimen: Peritoneal Cavity; Peritoneal Fluid  Result Value Ref Range Status   Specimen Description   Final    PERITONEAL CAVITY Performed at [facility], [address], [place], [place] [address]    Special Requests   Final    NONE Performed at [facility], [address] [place], [place] [address]    Gram Stain   Final    WBC PRESENT, PREDOMINANTLY PMN NO ORGANISMS SEEN CYTOSPIN SMEAR    Culture   Final    NO GROWTH 3 DAYS Performed at [facility], [address], [place], [place] [address]    Report Status 08/14/2023 FINAL  Final  MRSA Next Gen by PCR, Nasal     Status:  None   Collection Time: 08/14/23  5:10 AM   Specimen: Nasal Mucosa; Nasal Swab  Result Value Ref Range Status   MRSA by PCR Next Gen NOT DETECTED NOT DETECTED Final    Comment: (NOTE) The GeneXpert MRSA Assay (FDA approved for NASAL specimens only), is one component of a comprehensive MRSA colonization surveillance program. It is not intended to diagnose MRSA infection nor to guide or monitor treatment for MRSA infections. Test performance is not FDA approved in patients less than 41 years old. Performed at [facility], [address], [place], [place] [address]          Radiology Studies: No results found.      Scheduled Meds:  Chlorhexidine  Gluconate Cloth  6 each Topical Daily   feeding supplement  237 mL Oral TID BM   folic acid   1 mg Oral Daily   influenza vaccine adjuvanted  0.5 mL Intramuscular Tomorrow-1000   insulin  aspart  0-6 Units Subcutaneous TID WC   lactulose   20 g Oral BID   midodrine   10 mg Oral TID WC   multivitamin with minerals  1 tablet Oral Daily   pantoprazole  (PROTONIX ) IV  40 mg Intravenous Q12H   pneumococcal 20-valent conjugate vaccine  0.5 mL Intramuscular Tomorrow-1000   prednisoLONE   40 mg Oral Daily   sodium chloride  flush  10-40 mL Intracatheter Q12H   sucralfate   1 g Oral TID WC & HS   thiamine   100 mg Oral Daily   Continuous Infusions:  cefTRIAXone  (ROCEPHIN )  IV Stopped (08/19/23 1314)          Audria Leather, MD [facility] 08/20/2023, 8:01 AM

## 2023-08-23 ENCOUNTER — Encounter (HOSPITAL_COMMUNITY): Payer: Self-pay | Admitting: Radiology

## 2023-08-23 ENCOUNTER — Emergency Department (HOSPITAL_COMMUNITY)

## 2023-08-23 ENCOUNTER — Encounter (HOSPITAL_COMMUNITY): Admission: EM | Disposition: E | Payer: Self-pay | Source: Skilled Nursing Facility | Attending: Critical Care Medicine

## 2023-08-23 ENCOUNTER — Inpatient Hospital Stay (HOSPITAL_COMMUNITY)
Admission: EM | Admit: 2023-08-23 | Discharge: 2023-09-18 | DRG: 356 | Disposition: E | Source: Skilled Nursing Facility | Attending: Internal Medicine | Admitting: Internal Medicine

## 2023-08-23 ENCOUNTER — Other Ambulatory Visit: Payer: Self-pay

## 2023-08-23 DIAGNOSIS — E43 Unspecified severe protein-calorie malnutrition: Secondary | ICD-10-CM | POA: Diagnosis present

## 2023-08-23 DIAGNOSIS — K7040 Alcoholic hepatic failure without coma: Secondary | ICD-10-CM | POA: Diagnosis present

## 2023-08-23 DIAGNOSIS — K921 Melena: Secondary | ICD-10-CM | POA: Diagnosis present

## 2023-08-23 DIAGNOSIS — K766 Portal hypertension: Secondary | ICD-10-CM | POA: Diagnosis present

## 2023-08-23 DIAGNOSIS — K7682 Hepatic encephalopathy: Secondary | ICD-10-CM | POA: Diagnosis not present

## 2023-08-23 DIAGNOSIS — Z681 Body mass index (BMI) 19 or less, adult: Secondary | ICD-10-CM

## 2023-08-23 DIAGNOSIS — E872 Acidosis, unspecified: Secondary | ICD-10-CM | POA: Diagnosis present

## 2023-08-23 DIAGNOSIS — N179 Acute kidney failure, unspecified: Secondary | ICD-10-CM | POA: Diagnosis not present

## 2023-08-23 DIAGNOSIS — G9341 Metabolic encephalopathy: Secondary | ICD-10-CM | POA: Diagnosis not present

## 2023-08-23 DIAGNOSIS — R34 Anuria and oliguria: Secondary | ICD-10-CM | POA: Diagnosis not present

## 2023-08-23 DIAGNOSIS — R680 Hypothermia, not associated with low environmental temperature: Secondary | ICD-10-CM | POA: Diagnosis not present

## 2023-08-23 DIAGNOSIS — Z66 Do not resuscitate: Secondary | ICD-10-CM | POA: Diagnosis not present

## 2023-08-23 DIAGNOSIS — K7290 Hepatic failure, unspecified without coma: Secondary | ICD-10-CM | POA: Diagnosis present

## 2023-08-23 DIAGNOSIS — K7031 Alcoholic cirrhosis of liver with ascites: Secondary | ICD-10-CM | POA: Diagnosis present

## 2023-08-23 DIAGNOSIS — Z79899 Other long term (current) drug therapy: Secondary | ICD-10-CM

## 2023-08-23 DIAGNOSIS — Z515 Encounter for palliative care: Secondary | ICD-10-CM

## 2023-08-23 DIAGNOSIS — R578 Other shock: Secondary | ICD-10-CM | POA: Diagnosis not present

## 2023-08-23 DIAGNOSIS — E8770 Fluid overload, unspecified: Secondary | ICD-10-CM | POA: Diagnosis present

## 2023-08-23 DIAGNOSIS — D72829 Elevated white blood cell count, unspecified: Secondary | ICD-10-CM | POA: Diagnosis present

## 2023-08-23 DIAGNOSIS — R64 Cachexia: Secondary | ICD-10-CM | POA: Diagnosis present

## 2023-08-23 DIAGNOSIS — K7010 Alcoholic hepatitis without ascites: Secondary | ICD-10-CM | POA: Diagnosis present

## 2023-08-23 DIAGNOSIS — K7210 Chronic hepatic failure without coma: Secondary | ICD-10-CM

## 2023-08-23 DIAGNOSIS — R571 Hypovolemic shock: Secondary | ICD-10-CM | POA: Diagnosis not present

## 2023-08-23 DIAGNOSIS — E876 Hypokalemia: Secondary | ICD-10-CM | POA: Diagnosis present

## 2023-08-23 DIAGNOSIS — D684 Acquired coagulation factor deficiency: Secondary | ICD-10-CM | POA: Diagnosis present

## 2023-08-23 DIAGNOSIS — J9601 Acute respiratory failure with hypoxia: Secondary | ICD-10-CM | POA: Diagnosis not present

## 2023-08-23 DIAGNOSIS — I8510 Secondary esophageal varices without bleeding: Secondary | ICD-10-CM | POA: Diagnosis present

## 2023-08-23 DIAGNOSIS — K3189 Other diseases of stomach and duodenum: Secondary | ICD-10-CM | POA: Diagnosis present

## 2023-08-23 DIAGNOSIS — K922 Gastrointestinal hemorrhage, unspecified: Principal | ICD-10-CM | POA: Diagnosis present

## 2023-08-23 DIAGNOSIS — K7011 Alcoholic hepatitis with ascites: Secondary | ICD-10-CM | POA: Diagnosis present

## 2023-08-23 DIAGNOSIS — D6959 Other secondary thrombocytopenia: Secondary | ICD-10-CM | POA: Diagnosis present

## 2023-08-23 DIAGNOSIS — K264 Chronic or unspecified duodenal ulcer with hemorrhage: Principal | ICD-10-CM | POA: Diagnosis present

## 2023-08-23 DIAGNOSIS — Z7189 Other specified counseling: Secondary | ICD-10-CM | POA: Diagnosis not present

## 2023-08-23 DIAGNOSIS — R579 Shock, unspecified: Secondary | ICD-10-CM | POA: Diagnosis not present

## 2023-08-23 DIAGNOSIS — E1165 Type 2 diabetes mellitus with hyperglycemia: Secondary | ICD-10-CM | POA: Diagnosis not present

## 2023-08-23 DIAGNOSIS — D696 Thrombocytopenia, unspecified: Secondary | ICD-10-CM | POA: Diagnosis not present

## 2023-08-23 DIAGNOSIS — D689 Coagulation defect, unspecified: Secondary | ICD-10-CM | POA: Diagnosis not present

## 2023-08-23 DIAGNOSIS — K449 Diaphragmatic hernia without obstruction or gangrene: Secondary | ICD-10-CM | POA: Diagnosis present

## 2023-08-23 DIAGNOSIS — F1010 Alcohol abuse, uncomplicated: Secondary | ICD-10-CM | POA: Diagnosis present

## 2023-08-23 DIAGNOSIS — K7030 Alcoholic cirrhosis of liver without ascites: Secondary | ICD-10-CM | POA: Diagnosis not present

## 2023-08-23 DIAGNOSIS — I10 Essential (primary) hypertension: Secondary | ICD-10-CM | POA: Diagnosis present

## 2023-08-23 DIAGNOSIS — D62 Acute posthemorrhagic anemia: Secondary | ICD-10-CM | POA: Diagnosis present

## 2023-08-23 DIAGNOSIS — R54 Age-related physical debility: Secondary | ICD-10-CM | POA: Diagnosis present

## 2023-08-23 DIAGNOSIS — F17200 Nicotine dependence, unspecified, uncomplicated: Secondary | ICD-10-CM | POA: Diagnosis present

## 2023-08-23 DIAGNOSIS — K253 Acute gastric ulcer without hemorrhage or perforation: Secondary | ICD-10-CM | POA: Diagnosis present

## 2023-08-23 DIAGNOSIS — R739 Hyperglycemia, unspecified: Secondary | ICD-10-CM | POA: Diagnosis not present

## 2023-08-23 HISTORY — PX: IR US GUIDE VASC ACCESS RIGHT: IMG2390

## 2023-08-23 HISTORY — PX: IR EMBO ART  VEN HEMORR LYMPH EXTRAV  INC GUIDE ROADMAPPING: IMG5450

## 2023-08-23 HISTORY — PX: IR ANGIOGRAM VISCERAL SELECTIVE: IMG657

## 2023-08-23 LAB — COMPREHENSIVE METABOLIC PANEL WITH GFR
ALT: 111 U/L — ABNORMAL HIGH (ref 0–44)
AST: 106 U/L — ABNORMAL HIGH (ref 15–41)
Albumin: 2.2 g/dL — ABNORMAL LOW (ref 3.5–5.0)
Alkaline Phosphatase: 91 U/L (ref 38–126)
Anion gap: 7 (ref 5–15)
BUN: 34 mg/dL — ABNORMAL HIGH (ref 8–23)
CO2: 20 mmol/L — ABNORMAL LOW (ref 22–32)
Calcium: 9.5 mg/dL (ref 8.9–10.3)
Chloride: 113 mmol/L — ABNORMAL HIGH (ref 98–111)
Creatinine, Ser: 0.9 mg/dL (ref 0.61–1.24)
GFR, Estimated: >60 mL/min (ref >60)
Glucose, Bld: 118 mg/dL — ABNORMAL HIGH (ref 70–99)
Potassium: 3.4 mmol/L — ABNORMAL LOW (ref 3.5–5.1)
Sodium: 140 mmol/L (ref 135–145)
Total Bilirubin: 17.1 mg/dL — ABNORMAL HIGH (ref 0.0–1.2)
Total Protein: 5.6 g/dL — ABNORMAL LOW (ref 6.5–8.1)

## 2023-08-23 LAB — CBC
HCT: 24.9 % — ABNORMAL LOW (ref 39.0–52.0)
Hemoglobin: 8.1 g/dL — ABNORMAL LOW (ref 13.0–17.0)
MCH: 30.3 pg (ref 26.0–34.0)
MCHC: 32.5 g/dL (ref 30.0–36.0)
MCV: 93.3 fL (ref 80.0–100.0)
Platelets: 151 10*3/uL (ref 150–400)
RBC: 2.67 MIL/uL — ABNORMAL LOW (ref 4.22–5.81)
RDW: 21.2 % — ABNORMAL HIGH (ref 11.5–15.5)
WBC: 20.4 10*3/uL — ABNORMAL HIGH (ref 4.0–10.5)
nRBC: 0 % (ref 0.0–0.2)

## 2023-08-23 LAB — HEMOGLOBIN AND HEMATOCRIT, BLOOD
HCT: 25.2 % — ABNORMAL LOW (ref 39.0–52.0)
HCT: 25 % — ABNORMAL LOW (ref 39.0–52.0)
HCT: 21.1 % — ABNORMAL LOW (ref 39.0–52.0)
Hemoglobin: 8.4 g/dL — ABNORMAL LOW (ref 13.0–17.0)
Hemoglobin: 8.5 g/dL — ABNORMAL LOW (ref 13.0–17.0)
Hemoglobin: 7.2 g/dL — ABNORMAL LOW (ref 13.0–17.0)

## 2023-08-23 LAB — PREPARE RBC (CROSSMATCH)

## 2023-08-23 LAB — PROTIME-INR
INR: 2 — ABNORMAL HIGH (ref 0.8–1.2)
Prothrombin Time: 22.7 s — ABNORMAL HIGH (ref 11.4–15.2)

## 2023-08-23 LAB — POTASSIUM: Potassium: 3.7 mmol/L (ref 3.5–5.1)

## 2023-08-23 LAB — BASIC METABOLIC PANEL WITH GFR
Anion gap: 10 (ref 5–15)
BUN: 33 mg/dL — ABNORMAL HIGH (ref 8–23)
CO2: 18 mmol/L — ABNORMAL LOW (ref 22–32)
Calcium: 9.2 mg/dL (ref 8.9–10.3)
Chloride: 112 mmol/L — ABNORMAL HIGH (ref 98–111)
Creatinine, Ser: 0.68 mg/dL (ref 0.61–1.24)
GFR, Estimated: >60 mL/min (ref >60)
Glucose, Bld: 112 mg/dL — ABNORMAL HIGH (ref 70–99)
Potassium: 3.1 mmol/L — ABNORMAL LOW (ref 3.5–5.1)
Sodium: 140 mmol/L (ref 135–145)

## 2023-08-23 LAB — AMMONIA: Ammonia: 34 umol/L (ref 9–35)

## 2023-08-23 LAB — PHOSPHORUS: Phosphorus: 3.9 mg/dL (ref 2.5–4.6)

## 2023-08-23 LAB — POC OCCULT BLOOD, ED: Fecal Occult Bld: POSITIVE — AB

## 2023-08-23 LAB — MAGNESIUM: Magnesium: 1.8 mg/dL (ref 1.7–2.4)

## 2023-08-23 SURGERY — EGD (ESOPHAGOGASTRODUODENOSCOPY)
Anesthesia: Monitor Anesthesia Care

## 2023-08-23 MED ORDER — IOHEXOL 300 MG/ML  SOLN
250.0000 mL | Freq: Once | INTRAMUSCULAR | Status: AC | PRN
  Administered 2023-08-23: 84 mL via INTRA_ARTERIAL

## 2023-08-23 MED ORDER — FENTANYL CITRATE (PF) 100 MCG/2ML IJ SOLN
INTRAMUSCULAR | Status: AC
  Filled 2023-08-23: qty 2

## 2023-08-23 MED ORDER — SODIUM CHLORIDE 0.9 % IV SOLN
50.0000 ug/h | INTRAVENOUS | Status: DC
  Filled 2023-08-23: qty 1

## 2023-08-23 MED ORDER — SODIUM CHLORIDE 0.9 % IV BOLUS
500.0000 mL | Freq: Once | INTRAVENOUS | Status: AC
Start: 2023-08-23 — End: 2023-08-23
  Administered 2023-08-23: 500 mL via INTRAVENOUS

## 2023-08-23 MED ORDER — MIDAZOLAM HCL 2 MG/2ML IJ SOLN
INTRAMUSCULAR | Status: AC
  Filled 2023-08-23: qty 2

## 2023-08-23 MED ORDER — SODIUM CHLORIDE 0.9 % IV SOLN
INTRAVENOUS | Status: AC | PRN
  Administered 2023-08-23: 75 mL/h via INTRAVENOUS

## 2023-08-23 MED ORDER — LIDOCAINE HCL 1 % IJ SOLN
20.0000 mL | Freq: Once | INTRAMUSCULAR | Status: AC
  Administered 2023-08-23: 3 mL via INTRADERMAL

## 2023-08-23 MED ORDER — FENTANYL CITRATE (PF) 100 MCG/2ML IJ SOLN
INTRAMUSCULAR | Status: AC | PRN
  Administered 2023-08-23: 50 ug via INTRAVENOUS

## 2023-08-23 MED ORDER — IOHEXOL 350 MG/ML SOLN
100.0000 mL | Freq: Once | INTRAVENOUS | Status: AC | PRN
  Administered 2023-08-23: 100 mL via INTRAVENOUS

## 2023-08-23 MED ORDER — IOHEXOL 350 MG/ML SOLN
100.0000 mL | Freq: Once | INTRAVENOUS | Status: AC | PRN
  Administered 2023-08-23: 25 mL via INTRAVENOUS

## 2023-08-23 MED ORDER — SODIUM CHLORIDE 0.9% IV SOLUTION: Freq: Once | INTRAVENOUS | Status: AC

## 2023-08-23 MED ORDER — CHLORHEXIDINE GLUCONATE CLOTH 2 % EX PADS
6.0000 | MEDICATED_PAD | Freq: Every day | CUTANEOUS | Status: DC
  Administered 2023-08-23 – 2023-09-02 (×11): 6 via TOPICAL

## 2023-08-23 MED ORDER — ONDANSETRON HCL 4 MG PO TABS: 4.0000 mg | ORAL_TABLET | Freq: Four times a day (QID) | ORAL | Status: DC | PRN

## 2023-08-23 MED ORDER — POTASSIUM CHLORIDE IN NACL 40-0.9 MEQ/L-% IV SOLN
INTRAVENOUS | Status: AC
  Filled 2023-08-23: qty 1000

## 2023-08-23 MED ORDER — OCTREOTIDE LOAD VIA INFUSION
50.0000 ug | Freq: Once | INTRAVENOUS | Status: AC
Start: 2023-08-23 — End: 2023-08-23
  Administered 2023-08-23: 50 ug via INTRAVENOUS
  Filled 2023-08-23: qty 25

## 2023-08-23 MED ORDER — PANTOPRAZOLE SODIUM 40 MG IV SOLR
80.0000 mg | Freq: Once | INTRAVENOUS | Status: AC
  Administered 2023-08-23: 80 mg via INTRAVENOUS
  Filled 2023-08-23: qty 20

## 2023-08-23 MED ORDER — POTASSIUM CHLORIDE 10 MEQ/100ML IV SOLN
10.0000 meq | INTRAVENOUS | Status: AC
  Administered 2023-08-23 (×2): 10 meq via INTRAVENOUS
  Filled 2023-08-23 (×2): qty 100

## 2023-08-23 MED ORDER — LIDOCAINE HCL 1 % IJ SOLN
INTRAMUSCULAR | Status: AC
  Filled 2023-08-23: qty 20

## 2023-08-23 MED ORDER — MIDAZOLAM HCL 2 MG/2ML IJ SOLN
INTRAMUSCULAR | Status: AC | PRN
  Administered 2023-08-23: 1 mg via INTRAVENOUS

## 2023-08-23 MED ORDER — SODIUM CHLORIDE 0.9 % IV SOLN
1.0000 g | Freq: Once | INTRAVENOUS | Status: AC
  Administered 2023-08-23: 1 g via INTRAVENOUS
  Filled 2023-08-23: qty 10

## 2023-08-23 MED ORDER — VITAMIN K1 10 MG/ML IJ SOLN
10.0000 mg | Freq: Once | INTRAVENOUS | Status: AC
  Administered 2023-08-23: 10 mg via INTRAVENOUS
  Filled 2023-08-23: qty 1

## 2023-08-23 MED ORDER — ORAL CARE MOUTH RINSE: 15.0000 mL | OROMUCOSAL | Status: DC | PRN

## 2023-08-23 MED ORDER — SODIUM CHLORIDE 0.9 % IV SOLN
50.0000 ug/h | INTRAVENOUS | Status: DC
  Administered 2023-08-23 – 2023-08-25 (×5): 50 ug/h via INTRAVENOUS
  Filled 2023-08-23 (×9): qty 1

## 2023-08-23 MED ORDER — ALBUMIN HUMAN 5 % IV SOLN
12.5000 g | Freq: Once | INTRAVENOUS | Status: AC
  Administered 2023-08-23: 12.5 g via INTRAVENOUS
  Filled 2023-08-23: qty 250

## 2023-08-23 MED ORDER — ONDANSETRON HCL 4 MG/2ML IJ SOLN: 4.0000 mg | Freq: Four times a day (QID) | INTRAMUSCULAR | Status: DC | PRN

## 2023-08-23 MED ORDER — SODIUM CHLORIDE 0.9 % IV SOLN
2.0000 g | INTRAVENOUS | Status: DC
  Administered 2023-08-24 – 2023-08-29 (×6): 2 g via INTRAVENOUS
  Filled 2023-08-23 (×6): qty 20

## 2023-08-23 MED ORDER — PANTOPRAZOLE SODIUM 40 MG IV SOLR
40.0000 mg | Freq: Two times a day (BID) | INTRAVENOUS | Status: DC
  Administered 2023-08-23 – 2023-08-31 (×17): 40 mg via INTRAVENOUS
  Filled 2023-08-23 (×17): qty 10

## 2023-08-23 MED ORDER — SODIUM CHLORIDE (PF) 0.9 % IJ SOLN
INTRAMUSCULAR | Status: AC
  Filled 2023-08-23: qty 50

## 2023-08-23 MED ORDER — MAGNESIUM SULFATE 2 GM/50ML IV SOLN
2.0000 g | Freq: Once | INTRAVENOUS | Status: AC
  Administered 2023-08-23: 2 g via INTRAVENOUS
  Filled 2023-08-23: qty 50

## 2023-08-23 NOTE — ED Notes (Signed)
 Due to to rectal temp 95.3 bBair hugger blanket placed on pt.

## 2023-08-23 NOTE — ED Notes (Addendum)
 Received report from Adell Hones RN (IR) pt returned to room after successful arteriogram and embolization. Rt femoral Angio-seal intact, site unremarkable. Pt is to lie flat till 1520 then [facility] not to exceed over 30 degrees until 1720. Monitor rt femoral site.  Pt did receive 2 mg Versed and 100 mcg Fentanyl  for procedure, completed 2 units FFP and has PRC's infusing at present time. Pedal Pulse Present.

## 2023-08-23 NOTE — H&P (Signed)
 History and Physical    Patient: Kevin Velez:[id number] DOB: 14-Apr-1951 DOA: 08/23/2023 DOS: the patient was seen and examined on 08/23/2023 PCP: Patient, No Pcp Per  Patient coming from: Home  Chief Complaint:  Chief Complaint  Patient presents with   GI Bleeding   Rectal Bleeding   HPI: Kevin Velez is a 73 y.o. male with medical history significant of type 2 diabetes, hypertension, GERD, alcoholic liver cirrhosis/failure who was recently admitted and discharged to SNF from 08/08/2023 until 08/20/2023 due to lower GI bleed.  He is returning today from [facility] nursing facility due to rectal bleeding that started this morning when the patient started passing large bloody clots along with melanotic stools.  He also has abdominal pain with distention due to ascites. He has been nauseous, but no emesis.  Denied fever, chills, rhinorrhea, sore throat, wheezing or hemoptysis.  No chest pain, palpitations, diaphoresis, PND, orthopnea or pitting edema of the lower extremities.  No flank pain, dysuria, frequency or hematuria.    Lab work: CBC showed a white count of 20.4, hemoglobin 8.1 g/dL platelets 244.  PT 22.7 and INR 2.0.  Fecal occult blood was positive.  CMP showed a 740, potassium 3.4, chloride 113 and CO2 20 mmol/L with a normal anion gap.  Glucose 118, BUN 34 and creatinine 0.90 mg/dL.  Calcium was normal.  Total protein 5.6 and albumin  2.2 g/dL.  Alkaline phosphatase 91, AST 106 and ALT 811 units/L.  Total bilirubin was 17.1 mg/dL.  Imaging: CTA angio bleed show active high-volume upper GI bleeding from duodenal bulb region.  Emergent GI or interventional radiology recommended.  Small left pleural effusion and trace right pleural effusion.  Cirrhotic liver configuration with moderate to large ascites.  Aortic atherosclerosis.   ED course: Initial vital signs were temperature 97.5 F, pulse 77, respirations 16, BP 102/69 mmHg O2 sat 96% on room air.  The patient received IV fluids,  vitamin K, fentanyl  50 mcg IVP x 2, pantoprazole  80 mg IVP, vitamin K 10 mg IVP, ceftriaxone  1 g IVPB and was started on octreotide .  The patient was subsequently taken to the interventional radiology suite where he underwent embolization of the culprit vessel.  Review of Systems: As mentioned in the history of present illness. All other systems reviewed and are negative. Past Medical History:  Diagnosis Date   Diabetes mellitus without complication [facility])    Past Surgical History:  Procedure Laterality Date   ESOPHAGOGASTRODUODENOSCOPY N/A 08/14/2023   Procedure: EGD (ESOPHAGOGASTRODUODENOSCOPY);  Surgeon: Felecia Hopper, MD;  Location: [facility];  Service: Gastroenterology;  Laterality: N/A;   HOT HEMOSTASIS N/A 08/14/2023   Procedure: EGD, WITH ARGON PLASMA COAGULATION/GOLD PROBE;  Surgeon: Felecia Hopper, MD;  Location: [facility] ENDOSCOPY;  Service: Gastroenterology;  Laterality: N/A;   SCLEROTHERAPY  08/14/2023   Procedure: SCLEROTHERAPY;  Surgeon: Felecia Hopper, MD;  Location: [facility] ENDOSCOPY;  Service: Gastroenterology;;   Social History:  reports that he has been smoking. He has never used smokeless tobacco. He reports current alcohol use. No history on file for drug use.  No Known Allergies  No family history on file.  Prior to Admission medications   Medication Sig Start Date End Date Taking? Authorizing Provider  ascorbic acid (VITAMIN C) 500 MG tablet Take 500-1,000 mg by mouth daily.    [provider]  cyanocobalamin (VITAMIN B12) 1000 MCG tablet Take 1,000 mcg by mouth daily.    [provider]  folic acid  (FOLVITE ) 1 MG tablet Take 1 tablet (1  mg total) by mouth daily. 08/21/23   Audria Leather, MD  lactulose  (CHRONULAC ) 10 GM/15ML solution Take 30 mLs (20 g total) by mouth 2 (two) times daily. 08/20/23   Audria Leather, MD  midodrine  (PROAMATINE ) 10 MG tablet Take 1 tablet (10 mg total) by mouth 3 (three) times daily with meals. 08/20/23   Audria Leather, MD   Multiple Vitamin (MULTIVITAMIN WITH MINERALS) TABS tablet Take 1 tablet by mouth daily. 08/21/23   Audria Leather, MD  pantoprazole  (PROTONIX ) 40 MG tablet Take 1 tablet (40 mg total) by mouth 2 (two) times daily before a meal. 08/20/23 10/19/23  Audria Leather, MD  polyethylene glycol (MIRALAX  / GLYCOLAX ) 17 g packet Take 17 g by mouth daily as needed for moderate constipation. 08/20/23   Audria Leather, MD  prednisoLONE  5 MG TABS tablet Take 8 tablets (40 mg total) by mouth daily for 16 days. 08/21/23 09/06/23  Audria Leather, MD  sucralfate  (CARAFATE ) 1 GM/10ML suspension Take 10 mLs (1 g total) by mouth 4 (four) times daily -  with meals and at bedtime. 08/20/23   Audria Leather, MD  thiamine  (VITAMIN B-1) 100 MG tablet Take 1 tablet (100 mg total) by mouth daily. 08/21/23   Audria Leather, MD    Physical Exam: Vitals:   08/23/23 1315 08/23/23 1320 08/23/23 1325 08/23/23 1349  BP: 125/82 125/81 (!) 130/90 129/88  Pulse: 89 92 91 87  Resp: 16 15 13 14   Temp:    (!) 95.3 F (35.2 C)  TempSrc:    Rectal  SpO2: 99% 100% 98% 97%  Weight:      Height:       Physical Exam Vitals and nursing note reviewed.  Constitutional:      General: He is awake. He is not in acute distress.    Appearance: Normal appearance. He is normal weight. He is ill-appearing.  HENT:     Head: Normocephalic.     Nose: No rhinorrhea.     Mouth/Throat:     Mouth: Mucous membranes are moist.  Eyes:     General: Scleral icterus present.     Pupils: Pupils are equal, round, and reactive to light.  Neck:     Vascular: No JVD.  Cardiovascular:     Rate and Rhythm: Normal rate and regular rhythm.     Heart sounds: S1 normal and S2 normal.  Pulmonary:     Effort: Pulmonary effort is normal.     Breath sounds: Normal breath sounds. No wheezing, rhonchi or rales.  Abdominal:     General: Bowel sounds are normal. There is no distension.     Palpations: Abdomen is soft.     Tenderness: There is no abdominal tenderness. There  is no right CVA tenderness, left CVA tenderness or guarding.  Musculoskeletal:     Cervical back: Neck supple.     Right lower leg: No edema.     Left lower leg: No edema.  Skin:    General: Skin is warm and dry.     Coloration: Skin is jaundiced.  Neurological:     General: No focal deficit present.     Mental Status: He is alert and oriented to person, place, and time.  Psychiatric:        Mood and Affect: Mood normal.        Behavior: Behavior normal. Behavior is cooperative.     Data Reviewed:  Results are pending, will review when available.  Assessment and Plan: Principal Problem:   Acute  on chronic blood loss anemia In the setting of:   Ulcer, gastric, acute  And:   Acute lower GI bleeding Due to:   Coagulopathy (HCC) Secondary to:   Liver failure (HCC) In the setting of:   Alcoholic hepatitis    Alcoholic cirrhosis of liver with ascites (HCC) MELD score is 25. This Pansy Bogus carries a high risk of mortality.   Admit to stepdown/inpatient. Keep NPO for now. Continue IV fluids. Continue pantoprazole  infusion. Continue octreotide  infusion. Monitor H&H. Transfuse as needed. Continue ceftriaxone  for SBP prophylaxis. GI consult appreciated.  Active Problems:   Protein-calorie malnutrition, severe In the setting of liver failure. Prognosis is poor.    Hypokalemia Replacing. Magnesium  was supplemented.    Leukocytosis On on ceftriaxone  as above. Follow-up WBC in AM.     Advance Care Planning:   Code Status: Full Code   Consults: Interventional radiology and [facility] gastroenterology.  Family Communication:   Severity of Illness: The appropriate patient status for this patient is INPATIENT. Inpatient status is judged to be reasonable and necessary in order to provide the required intensity of service to ensure the patient's safety. The patient's presenting symptoms, physical exam findings, and initial radiographic and laboratory data in the context of  their chronic comorbidities is felt to place them at high risk for further clinical deterioration. Furthermore, it is not anticipated that the patient will be medically stable for discharge from the hospital within 2 midnights of admission.   * I certify that at the point of admission it is my clinical judgment that the patient will require inpatient hospital care spanning beyond 2 midnights from the point of admission due to high intensity of service, high risk for further deterioration and high frequency of surveillance required.*  Author: Danice Dural, MD 08/23/2023 2:01 PM  For on call review [email or web address].   This document was prepared using Dragon voice recognition software and may contain some unintended transcription errors.

## 2023-08-23 NOTE — ED Provider Notes (Signed)
 [place] EMERGENCY DEPARTMENT AT [facility] Provider Note   CSN: [id number] Arrival date & time: 08/23/23  0725     History  Chief Complaint  Patient presents with   GI Bleeding   Rectal Bleeding    Kevin Velez is a 73 y.o. male who arrives from skilled nursing facility due to worsening GI bleed. Patient states that he has had blood in his stool for a few days but unsure of specific timeline. States he has been seen outpatient by GI for this issue, but states that it is getting worse. He is unsure of name of GI doctor or practice that he has been seeing. Patient states that his stool has been black and that he has been passing some blood clots in his stool as well. Patient states he is not having abdominal pain but that he feels bloated and tight in his abdominal area. States that he feels like his belly is also sticking out further than usual. Denies ever having a GI bleed similar to this before. After further chart review patient just had EGD done by [facility] for this incident which showed a small (<52mm) esophageal varice found in the distal esophagus, a small hiatal hernia, blood clots in the fundus, and a spurting cratered duodenal ulcer. Patient also has a history of liver failure.    Rectal Bleeding Associated symptoms: no abdominal pain, no fever and no vomiting       Home Medications Prior to Admission medications   Medication Sig Start Date End Date Taking? Authorizing Provider  ascorbic acid (VITAMIN C) 500 MG tablet Take 500-1,000 mg by mouth daily.    [provider]  cyanocobalamin (VITAMIN B12) 1000 MCG tablet Take 1,000 mcg by mouth daily.    [provider]  folic acid  (FOLVITE ) 1 MG tablet Take 1 tablet (1 mg total) by mouth daily. 08/21/23   Audria Leather, MD  lactulose  (CHRONULAC ) 10 GM/15ML solution Take 30 mLs (20 g total) by mouth 2 (two) times daily. 08/20/23   Audria Leather, MD  midodrine  (PROAMATINE ) 10 MG tablet Take 1 tablet  (10 mg total) by mouth 3 (three) times daily with meals. 08/20/23   Audria Leather, MD  Multiple Vitamin (MULTIVITAMIN WITH MINERALS) TABS tablet Take 1 tablet by mouth daily. 08/21/23   Audria Leather, MD  pantoprazole  (PROTONIX ) 40 MG tablet Take 1 tablet (40 mg total) by mouth 2 (two) times daily before a meal. 08/20/23 10/19/23  Audria Leather, MD  polyethylene glycol (MIRALAX  / GLYCOLAX ) 17 g packet Take 17 g by mouth daily as needed for moderate constipation. 08/20/23   Audria Leather, MD  prednisoLONE  5 MG TABS tablet Take 8 tablets (40 mg total) by mouth daily for 16 days. 08/21/23 09/06/23  Audria Leather, MD  sucralfate  (CARAFATE ) 1 GM/10ML suspension Take 10 mLs (1 g total) by mouth 4 (four) times daily -  with meals and at bedtime. 08/20/23   Audria Leather, MD  thiamine  (VITAMIN B-1) 100 MG tablet Take 1 tablet (100 mg total) by mouth daily. 08/21/23   Audria Leather, MD      Allergies    Patient has no known allergies.    Review of Systems   Review of Systems  Constitutional:  Negative for chills and fever.  Respiratory:  Negative for cough, chest tightness and shortness of breath.   Cardiovascular:  Negative for chest pain.  Gastrointestinal:  Positive for abdominal distention, anal bleeding, blood in stool and hematochezia. Negative for abdominal  pain and vomiting.  Skin:  Negative for rash and wound.  Neurological:  Negative for syncope, facial asymmetry and weakness.    Physical Exam Updated Vital Signs BP 102/69 (BP Location: Left Arm)   Pulse 77   Temp (!) 97.5 F (36.4 C)   Resp 16   Ht 5\' 7"  (1.702 m)   Wt 50.8 kg   SpO2 98%   BMI 17.54 kg/m  Physical Exam Vitals and nursing note reviewed.  Constitutional:      General: He is awake. He is not in acute distress.    Appearance: Normal appearance. He is not ill-appearing or toxic-appearing.  HENT:     Head: Normocephalic and atraumatic.  Eyes:     Comments: jauniced  Cardiovascular:     Rate and Rhythm: Normal rate and  regular rhythm.  Pulmonary:     Effort: Pulmonary effort is normal. No respiratory distress.     Breath sounds: Normal breath sounds.  Abdominal:     General: Bowel sounds are increased. There is distension.     Palpations: Abdomen is soft. There is no mass.     Tenderness: There is no abdominal tenderness. There is no guarding or rebound.  Skin:    General: Skin is warm and dry.     Capillary Refill: Capillary refill takes less than 2 seconds.  Neurological:     General: No focal deficit present.     Mental Status: He is alert and oriented to person, place, and time.  Psychiatric:        Mood and Affect: Mood normal.        Behavior: Behavior normal. Behavior is cooperative.     ED Results / Procedures / Treatments   Labs (all labs ordered are listed, but only abnormal results are displayed) Labs Reviewed  COMPREHENSIVE METABOLIC PANEL WITH GFR  CBC  POC OCCULT BLOOD, ED  TYPE AND SCREEN    EKG None  Radiology No results found.  Procedures .Critical Care  Performed by: Fonda Hymen, PA-C Authorized by: Fonda Hymen, PA-C   Critical care provider statement:    Critical care time (minutes):  30   Critical care was necessary to treat or prevent imminent or life-threatening deterioration of the following conditions:  Hepatic failure and shock   Critical care was time spent personally by me on the following activities:  Development of treatment plan with patient or surrogate, examination of patient, obtaining history from patient or surrogate and re-evaluation of patient's condition   I assumed direction of critical care for this patient from another provider in my specialty: yes   Comments:     Engaged in critical care along with attending physician     Medications Ordered in ED Medications - No data to display  ED Course/ Medical Decision Making/ A&P Clinical Course as of 08/23/23 0913  Tue Aug 23, 2023  0909 Hemoglobin(!): 8.1 [CH]    Clinical  Course User Index [CH] Fonda Hymen, [place]    Patient presents to the ED for concern of worsening GI bleed, this involves an extensive number of treatment options, and is a complaint that carries with it a high risk of complications and morbidity.  The differential diagnosis includes upper GI bleed, esophageal varices, ulcer, trauma/injury, lower GI bleed, etc.   Patient hemodynamically stable throughout ED stay. GI consulted as well as Interventional radiology.    Co morbidities that complicate the patient evaluation  Recent EGD, currently at skilled nursing facility, liver  failure   Additional history obtained:  Additional history obtained from Previous surgeryEMS, Family, Nursing, Outside Medical Records, and Past Admission   External records from outside source obtained and reviewed including recent EGD surgery showing a small esophageal varice, small hiatal hernia, blood clots, and a spurting duodenal ulcer.   Lab Tests:  I Ordered, and personally interpreted labs.  The pertinent results include: Hemoglobin 8.1, HCT 24.9, PT 22.7, INR 2.0, WBC 20.4, POC occult blood positive, type and screen O POS   Imaging Studies ordered:  I ordered imaging studies including CTA I independently visualized and interpreted imaging which showed  Active high volume upper GI bleeding from duodenal bulb region,  as described above. Emergent GI or interventional radiology  consultation is recommended.  2. Small left pleural effusion and trace right pleural effusion.  3. Cirrhotic liver configuration and moderate-to-large ascites.  4. Multiple other nonacute observations, as described above.    Aortic Atherosclerosis (ICD10-I70.0).    Critical Value/emergent results were called by telephone at the time  of interpretation on 08/23/2023 at 10:48 am to provider [facility]  , who verbally acknowledged these results.   I agree with the radiologist interpretation   Cardiac  Monitoring:  The patient was maintained on a cardiac monitor.  I personally viewed and interpreted the cardiac monitored which showed an underlying rhythm of: sinus   Medicines ordered and prescription drug management:  I ordered medication including protonix  and octreotide  for GI bleed, vitamin K for GI bleed in presence of liver failure, ceftriaxone  for elevated WBC count in presence of bleed, fresh frozen plasma/RBC for transfusion Reevaluation of the patient after these medicines showed that the patient stayed the same I have reviewed the patients home medicines and have made adjustments as needed   Critical Interventions:  Large bore IV access, type and screen, prepare fresh frozen plasma Active upper GI bleed, central line placed by attending   Consultations Obtained:  I requested consultation with the GI team (Dr. Lavaughn Portland, [facility]),  and discussed lab and imaging findings as well as pertinent plan - they recommend: protonix  and ocreotide, 2 units of FFP as well as EGD either today or tomorrow, CTA as well Also requested consultation with interventional radiology    Consultation with the hospitalist for admission   Problem List / ED Course:  Upper GI bleed, melena    Reevaluation:  After the interventions noted above, I reevaluated the patient and found that they have :stayed the same   Social Determinants of Health:  none   Dispostion:  After consideration of the diagnostic results and the patients response to treatment, I feel that the patent would benefit from admission to the hospital with close follow-up with GI/IR. Patient stable at time of exam in ED, CTA results verbally given to attending and plan put in place with GI/IR for intervention.   Click here for ABCD2, HEART and other calculatorsREFRESH Note before signing :1}                              Medical Decision Making Amount and/or Complexity of Data Reviewed Labs: ordered. Decision-making details  documented in ED Course. Radiology: ordered.  Risk Prescription drug management. Decision regarding hospitalization.          Final Clinical Impression(s) / ED Diagnoses Final diagnoses:  None    Rx / DC Orders ED Discharge Orders     None  Fonda Hymen, PA-C 08/23/23 1344    Deatra Face, MD 08/23/23 1446

## 2023-08-23 NOTE — Consult Note (Signed)
 Chief Complaint: Bloody stools, abdominal distention, known duodenal ulcer; referred for mesenteric/visceral arteriogram with possible embolization  Referring Provider(s): Nanavati,A  Supervising Physician: Creasie Doctor  Patient Status: [facility] - ED  History of Present Illness: Kevin Velez is a 73 y.o. male smoker, resident of [place] nursing facility, who was recently discharged from [facility] on 5/3 following admission for hemorrhagic shock, acute GI bleeding from duodenal ulcer with visible vessel. Had EGD on 08/14/2023 which showed showed large duodenal ulcer, visible vessel with active bleeding treated with epinephrine  injection, cautery, small nonbleeding esophageal varices.  Continued to have bleeding afterwards with melena.  Repeat CTA of abdomen on 08/15/2023 showed no further bleeding.  Patient also with history of cirrhosis with ascites, alcoholic hepatitis, elevated LFTs, thrombocytopenia, hepatic encephalopathy, coagulopathy, protein calorie malnutrition.  He presents back to [facility] today with persistent bloody stools which started within past 24 hours per pt.  CT angiogram revealed:  1. Active high volume upper GI bleeding from duodenal bulb region, as described above. Emergent GI or interventional radiology consultation is recommended. 2. Small left pleural effusion and trace right pleural effusion. 3. Cirrhotic liver configuration and moderate-to-large ascites. 4. Multiple other nonacute observations, as described above.   Aortic Atherosclerosis   Patient currently afebrile, BP 102/69, heart rate 77, respirations 18, WBC 20.4, hemoglobin 8.1, platelets normal.  PT 22.7, INR 2, creatinine 0.90 potassium 3.4.  Left femoral venous catheter in place.  Request now received for emergent vessel/mesenteric arteriogram with possible embolization.      Patient is Full Code  Past Medical History:  Diagnosis Date   Diabetes mellitus without complication [facility])      Past Surgical History:  Procedure Laterality Date   ESOPHAGOGASTRODUODENOSCOPY N/A 08/14/2023   Procedure: EGD (ESOPHAGOGASTRODUODENOSCOPY);  Surgeon: Felecia Hopper, MD;  Location: [facility];  Service: Gastroenterology;  Laterality: N/A;   HOT HEMOSTASIS N/A 08/14/2023   Procedure: EGD, WITH ARGON PLASMA COAGULATION/GOLD PROBE;  Surgeon: Felecia Hopper, MD;  Location: [facility] ENDOSCOPY;  Service: Gastroenterology;  Laterality: N/A;   SCLEROTHERAPY  08/14/2023   Procedure: SCLEROTHERAPY;  Surgeon: Felecia Hopper, MD;  Location: [facility] ENDOSCOPY;  Service: Gastroenterology;;    Allergies: Patient has no known allergies.  Medications: Prior to Admission medications   Medication Sig Start Date End Date Taking? Authorizing Provider  ascorbic acid (VITAMIN C) 500 MG tablet Take 500-1,000 mg by mouth daily.    [provider]  cyanocobalamin (VITAMIN B12) 1000 MCG tablet Take 1,000 mcg by mouth daily.    [provider]  folic acid  (FOLVITE ) 1 MG tablet Take 1 tablet (1 mg total) by mouth daily. 08/21/23   Audria Leather, MD  lactulose  (CHRONULAC ) 10 GM/15ML solution Take 30 mLs (20 g total) by mouth 2 (two) times daily. 08/20/23   Audria Leather, MD  midodrine  (PROAMATINE ) 10 MG tablet Take 1 tablet (10 mg total) by mouth 3 (three) times daily with meals. 08/20/23   Audria Leather, MD  Multiple Vitamin (MULTIVITAMIN WITH MINERALS) TABS tablet Take 1 tablet by mouth daily. 08/21/23   Audria Leather, MD  pantoprazole  (PROTONIX ) 40 MG tablet Take 1 tablet (40 mg total) by mouth 2 (two) times daily before a meal. 08/20/23 10/19/23  Audria Leather, MD  polyethylene glycol (MIRALAX  / GLYCOLAX ) 17 g packet Take 17 g by mouth daily as needed for moderate constipation. 08/20/23   Audria Leather, MD  prednisoLONE  5 MG TABS tablet Take 8 tablets (40 mg total) by mouth daily for 16 days. 08/21/23  09/06/23  Audria Leather, MD  sucralfate  (CARAFATE ) 1 GM/10ML suspension Take 10 mLs (1 g total) by mouth  4 (four) times daily -  with meals and at bedtime. 08/20/23   Audria Leather, MD  thiamine  (VITAMIN B-1) 100 MG tablet Take 1 tablet (100 mg total) by mouth daily. 08/21/23   Audria Leather, MD     No family history on file.  Social History   Socioeconomic History   Marital status: Divorced    Spouse name: Not on file   Number of children: Not on file   Years of education: Not on file   Highest education level: Not on file  Occupational History   Not on file  Tobacco Use   Smoking status: Every Day   Smokeless tobacco: Never  Substance and Sexual Activity   Alcohol use: Yes   Drug use: Not on file   Sexual activity: Not Currently  Other Topics Concern   Not on file  Social History Narrative   Not on file   Social Drivers of Health   Financial Resource Strain: Not on file  Food Insecurity: Patient Unable To Answer (08/09/2023)   Hunger Vital Sign    Worried About Running Out of Food in the Last Year: Patient unable to answer    Ran Out of Food in the Last Year: Patient unable to answer  Transportation Needs: Patient Unable To Answer (08/09/2023)   PRAPARE - Transportation    Lack of Transportation (Medical): Patient unable to answer    Lack of Transportation (Non-Medical): Patient unable to answer  Physical Activity: Not on file  Stress: Not on file  Social Connections: Patient Unable To Answer (08/09/2023)   Social Connection and Isolation Panel [NHANES]    Frequency of Communication with Friends and Family: Patient unable to answer    Frequency of Social Gatherings with Friends and Family: Patient unable to answer    Attends Religious Services: Patient unable to answer    Active Member of Clubs or Organizations: Patient unable to answer    Attends Banker Meetings: Patient unable to answer    Marital Status: Patient unable to answer       Review of Systems currently denies fever, headache, chest pain, worsening dyspnea, cough, back pain, nausea, vomiting.   He does have abdominal fullness/ distention and above-noted bloody stools  Vital Signs: BP 102/69 (BP Location: Left Arm)   Pulse 77   Temp (!) 97.5 F (36.4 C)   Resp 16   Ht 5\' 7"  (1.702 m)   Wt 112 lb (50.8 kg)   SpO2 98%   BMI 17.54 kg/m   Advance Care Plan: The advanced care place/surrogate decision maker was discussed at the time of visit and the patient did not wish to discuss or was not able to name a surrogate decision maker or provide an advance care plan.  Physical Exam awake, answering questions appropriately.  Chest with slightly diminished breath sounds bases.  Heart with regular rate and rhythm.  Abdomen distended, positive bowel sounds, some mild generalized tenderness to palpation.  No lower extremity edema.  Left femoral venous catheter in place.  Imaging: CT ANGIO GI BLEED Result Date: 08/23/2023 CLINICAL DATA:  Duodenal ulcer melena, active bleeding. EXAM: CTA ABDOMEN AND PELVIS WITHOUT AND WITH CONTRAST TECHNIQUE: Multidetector CT imaging of the abdomen and pelvis was performed using the standard protocol during bolus administration of intravenous contrast. Multiplanar reconstructed images and MIPs were obtained and reviewed to evaluate the vascular anatomy.  RADIATION DOSE REDUCTION: This exam was performed according to the departmental dose-optimization program which includes automated exposure control, adjustment of the mA and/or kV according to patient size and/or use of iterative reconstruction technique. CONTRAST:  OMNIPAQUE  IOHEXOL  350 MG/ML SOLN COMPARISON:  CT angiography abdomen and pelvis from 08/15/2023. FINDINGS: VASCULAR Aorta: Normal caliber aorta without aneurysm, dissection, vasculitis or significant stenosis. Celiac: Patent without evidence of aneurysm, dissection, vasculitis or significant stenosis. There is a small branch arising from the common hepatic artery which extends up to the duodenal bulb region which is associated with active extravasation of  contrast with contras blush measuring up to 1.1 x 1.9 cm on arterial phase images, which slightly increases in size on the portal venous phase images and markedly increases on the delayed images with extension up to the duodeno-jejunal junction region, compatible with source of high volume active GI bleeding. SMA: Patent without evidence of aneurysm, dissection, vasculitis or significant stenosis. Renals: Both renal arteries are patent without evidence of aneurysm, dissection, vasculitis, fibromuscular dysplasia or significant stenosis. IMA: Patent without evidence of aneurysm, dissection, vasculitis or significant stenosis. Inflow: Patent without evidence of aneurysm, dissection, vasculitis or significant stenosis. Proximal Outflow: Bilateral common femoral and visualized portions of the superficial and profunda femoral arteries are patent without evidence of aneurysm, dissection, vasculitis or significant stenosis. Veins: No obvious venous abnormality within the limitations of this arterial phase study. Review of the MIP images confirms the above findings. NON-VASCULAR Lower chest: There is small left pleural effusion with associated compressive changes at the left lung base. There is trace right pleural effusion. Bilateral lung bases are otherwise clear. Normal cardiac size. No pericardial effusion. There is apparent hypoattenuation of the blood pool relative to the myocardium, suggestive of anemia. Hepatobiliary: The liver is normal in size. There is liver surface irregularity/nodularity, compatible with cirrhotic configuration. No intrahepatic or extrahepatic bile duct dilation. There are layering hyperattenuating areas in the gallbladder, likely vicarious excretion of previously administered intravenous contrast. No abnormal wall thickening or pericholecystic fat stranding. No imaging evidence of acute cholecystitis. Pancreas: Unremarkable. No pancreatic ductal dilatation or surrounding inflammatory changes.  Spleen: Within normal limits. No focal lesion. Adrenals/Urinary Tract: Adrenal glands are unremarkable. No suspicious renal mass. There are several simple cysts throughout bilateral kidneys with largest measuring up to 2.3 x 3.3 cm arising from the right kidney interpolar region, laterally. No hydroureteronephrosis or nephroureterolithiasis. Unremarkable urinary bladder. Stomach/Bowel: No disproportionate dilation of the small or large bowel loops. No evidence of abnormal bowel wall thickening or inflammatory changes. The appendix is unremarkable. There are multiple diverticula throughout the colon, without imaging signs of diverticulitis. Vascular/Lymphatic: There is moderate to large ascites. No pneumoperitoneum. No abdominal or pelvic lymphadenopathy, by size criteria. No aneurysmal dilation of the major abdominal arteries. There are mild peripheral atherosclerotic vascular calcifications of the aorta and its major branches. Reproductive: Normal size prostate. Symmetric seminal vesicles. Other: Moderate anasarca. The soft tissues and abdominal wall are otherwise unremarkable. Musculoskeletal: No suspicious osseous lesions. There are mild - moderate multilevel degenerative changes in the visualized spine. IMPRESSION: 1. Active high volume upper GI bleeding from duodenal bulb region, as described above. Emergent GI or interventional radiology consultation is recommended. 2. Small left pleural effusion and trace right pleural effusion. 3. Cirrhotic liver configuration and moderate-to-large ascites. 4. Multiple other nonacute observations, as described above. Aortic Atherosclerosis (ICD10-I70.0). Critical Value/emergent results were called by telephone at the time of interpretation on 08/23/2023 at 10:48 am to provider [facility] , who  verbally acknowledged these results. Electronically Signed   By: Beula Brunswick M.D.   On: 08/23/2023 10:49   CT Angio Abd/Pel w/ and/or w/o Result Date: 08/15/2023 CLINICAL DATA:   Lower gastrointestinal hemorrhage EXAM: CT ANGIOGRAPHY ABDOMEN AND PELVIS WITH CONTRAST AND WITHOUT CONTRAST TECHNIQUE: Multidetector CT imaging of the abdomen and pelvis was performed using the standard protocol during bolus administration of intravenous contrast. Multiplanar reconstructed images and MIPs were obtained and reviewed to evaluate the vascular anatomy. RADIATION DOSE REDUCTION: This exam was performed according to the departmental dose-optimization program which includes automated exposure control, adjustment of the mA and/or kV according to patient size and/or use of iterative reconstruction technique. CONTRAST:  OMNIPAQUE  IOHEXOL  350 MG/ML SOLN COMPARISON:  08/10/2023 FINDINGS: VASCULAR Aorta: Normal caliber aorta without aneurysm, dissection, vasculitis or significant stenosis. Mild atherosclerotic calcification Celiac: Patent without evidence of aneurysm, dissection, vasculitis or significant stenosis. SMA: Patent without evidence of aneurysm, dissection, vasculitis or significant stenosis. Renals: Single renal arteries are seen bilaterally demonstrating wide patency. There is a beaded appearance involving the mid segment of the renal arteries bilaterally in keeping with changes of fibromuscular dysplasia. No dissection or aneurysm. IMA: Patent without evidence of aneurysm, dissection, vasculitis or significant stenosis. Inflow: Patent without evidence of aneurysm, dissection, vasculitis or significant stenosis. Proximal Outflow: Bilateral common femoral and visualized portions of the superficial and profunda femoral arteries are patent without evidence of aneurysm, dissection, vasculitis or significant stenosis. Veins: No obvious venous abnormality within the limitations of this arterial phase study. Review of the MIP images confirms the above findings. NON-VASCULAR Lower chest: Trace right and small left pleural effusions with associated bibasilar atelectasis. Mild emphysema. No acute  abnormality. Hepatobiliary: Mildly nodular liver contour suggests changes of underlying cirrhosis. Superimposed mild hepatic steatosis. No enhancing intrahepatic mass. No intra or extrahepatic biliary ductal dilation. Cholelithiasis noted without superimposed pericholecystic inflammatory change. Pancreas: Unremarkable Spleen: Unremarkable Adrenals/Urinary Tract: Adrenal glands are unremarkable. Simple cortical cysts are seen within the kidneys bilaterally for which no follow-up imaging is recommended. The kidneys are otherwise unremarkable. Bladder unremarkable. Stomach/Bowel: Stable mild ascites. No active gastrointestinal hemorrhage. Moderate ascending and descending colonic diverticulosis. Stomach, small bowel, and large are otherwise unremarkable. Appendix normal. No free intraperitoneal gas. Lymphatic: No pathologic adenopathy within the abdomen and pelvis. Reproductive: Prostate is unremarkable. Other: Moderate, progressive subcutaneous body wall edema, best appreciated within flanks bilaterally. Mild retroperitoneal edema again noted diffusely. Musculoskeletal: No acute bone abnormality. No lytic or blastic bone lesion. Osseous structures are age appropriate. IMPRESSION: 1. No active gastrointestinal hemorrhage. 2. Beaded appearance of the renal arteries bilaterally in keeping with changes of fibromuscular dysplasia. 3. Progressive anasarca with progressive subcutaneous body wall edema, mild retroperitoneal edema, and small left and trace right pleural effusions. Mild ascites may reflect combination anasarca as well as portal venous hypertension. 4. Suspected cirrhosis. Superimposed hepatic steatosis. If indicated, this could be further assessed with hepatic elastography or trans venous tissue sampling. 5. Cholelithiasis. 6. Moderate ascending and descending colonic diverticulosis. Aortic Atherosclerosis (ICD10-I70.0). Electronically Signed   By: Worthy Heads M.D.   On: 08/15/2023 01:42   US  EKG SITE  RITE Result Date: 08/14/2023 If Site Rite image not attached, placement could not be confirmed due to current cardiac rhythm.  DG Abd 1 View Result Date: 08/10/2023 CLINICAL DATA:  Abdominal pain EXAM: ABDOMEN - 1 VIEW COMPARISON:  October 03, 2009 FINDINGS: Nonobstructive bowel gas pattern. Enteric contrast in the proximal colon and rectum. Temperature probe overlying the pelvis. No pneumoperitoneum. No  organomegaly or radiopaque calculi. No acute fracture or destructive lesion. Multilevel degenerative disc disease of the spine. Streaky atelectasis in the lung bases. IMPRESSION: Nonobstructive bowel gas pattern. Electronically Signed   By: Rance Burrows M.D.   On: 08/10/2023 16:29   CT ABDOMEN PELVIS WO CONTRAST Result Date: 08/10/2023 CLINICAL DATA:  Increasing weakness. EXAM: CT ABDOMEN AND PELVIS WITHOUT CONTRAST TECHNIQUE: Multidetector CT imaging of the abdomen and pelvis was performed following the standard protocol without IV contrast. RADIATION DOSE REDUCTION: This exam was performed according to the departmental dose-optimization program which includes automated exposure control, adjustment of the mA and/or kV according to patient size and/or use of iterative reconstruction technique. COMPARISON:  None Available. FINDINGS: Lower chest: Mild to moderate severity posterior right basilar atelectasis and/or infiltrate is seen. Small bilateral pleural effusions are noted. Hepatobiliary: The liver is cirrhotic in appearance. A tiny gallstone is seen within the dependent portion of a mildly distended gallbladder. There is no evidence of gallbladder wall thickening, pericholecystic inflammation or biliary dilatation. Pancreas: Ill-defined and linear inflammatory fat stranding is seen along the posterior aspect of the tail of the pancreas and, to a lesser degree, along the medial aspect of the pancreatic head. Moderate to marked severity bilateral retro mesenteric fat stranding is also seen extending along  the anterior aspects of both kidneys into the lower abdomen. The pancreatic duct measures approximately 4.3 mm in diameter. Spleen: Normal in size without focal abnormality. Adrenals/Urinary Tract: Adrenal glands are unremarkable. Kidneys are normal in size, without renal calculi or hydronephrosis. Bilateral renal cysts are seen. A Foley catheter is seen within a predominantly empty urinary bladder. Stomach/Bowel: A properly position nasogastric tube is in place. Stomach is within normal limits. Appendix appears normal. No evidence of bowel dilatation. The cecum and ascending colon are mildly thickened and inflamed. Numerous noninflamed diverticula are seen throughout the large bowel. Vascular/Lymphatic: Aortic atherosclerosis. A venous catheter is seen entering via the right groin. No enlarged abdominal or pelvic lymph nodes. Reproductive: The prostate gland is mildly enlarged. Other: No abdominal wall hernia or abnormality. There is a moderate amount of abdominopelvic ascites. Musculoskeletal: No acute or significant osseous findings. IMPRESSION: 1. Extensive retro-mesenteric inflammation which may represent sequelae associated with retroperitoneal fasciitis. Alternate inflammatory processes such as acute pancreatitis cannot be excluded. 2. Mild colitis involving the cecum and ascending colon. 3. Colonic diverticulosis. 4. Moderate amount of abdominopelvic ascites. 5. Mild to moderate severity posterior right basilar atelectasis and/or infiltrate. 6. Small bilateral pleural effusions. 7. Bilateral renal cysts. No follow-up imaging is recommended. This recommendation follows ACR consensus guidelines: Management of the Incidental Renal Mass on CT: A White Paper of the ACR Incidental Findings Committee. J Am Coll Radiol [phone]. Electronically Signed   By: Virgle Grime M.D.   On: 08/10/2023 03:07   ECHOCARDIOGRAM COMPLETE Result Date: 08/09/2023    ECHOCARDIOGRAM REPORT   Patient Name:   HAARIS PARIS Date of Exam: 08/09/2023 Medical Rec #:  [id number]       Height:       69.0 in Accession #:    [id number]      Weight:       113.8 lb Date of Birth:  02-28-1951        BSA:          1.625 m Patient Age:    73 years        BP:           110/56 mmHg Patient Gender: M  HR:           98 bpm. Exam Location:  Inpatient Procedure: 2D Echo, Cardiac Doppler, Color Doppler and Intracardiac            Opacification Agent (Both Spectral and Color Flow Doppler were            utilized during procedure). Indications:    Liver Failure  History:        Patient has no prior history of Echocardiogram examinations.  Sonographer:    Andrena Bang Referring Phys: [id number] MATTHEW R HUNSUCKER  Sonographer Comments: Technically difficult study due to poor echo windows. IMPRESSIONS  1. Left ventricular ejection fraction, by estimation, is >75%. The left ventricle has hyperdynamic function. The left ventricle has no regional wall motion abnormalities. Left ventricular diastolic parameters were normal.  2. Right ventricular systolic function is normal. The right ventricular size is grossly normal.  3. Right atrial size was grossly normal.  4. The mitral valve is grossly normal. Mild mitral valve regurgitation. No evidence of mitral stenosis.  5. The aortic valve was not well visualized. Aortic valve regurgitation is not visualized. No aortic stenosis is present.  6. The inferior vena cava is normal in size with <50% respiratory variability, suggesting right atrial pressure of 8 mmHg. Comparison(s): No prior Echocardiogram. FINDINGS  Left Ventricle: Left ventricular ejection fraction, by estimation, is >75%. The left ventricle has hyperdynamic function. The left ventricle has no regional wall motion abnormalities. Definity  contrast agent was given IV to delineate the left ventricular endocardial borders. The left ventricular internal cavity size was normal in size. Suboptimal image quality limits for assessment of left  ventricular hypertrophy. Left ventricular diastolic parameters were normal. Right Ventricle: The right ventricular size is grossly normal. Right vetricular wall thickness was not well visualized. Right ventricular systolic function is normal. Left Atrium: Left atrial size was normal in size. Right Atrium: Right atrial size was grossly normal. Pericardium: There is no evidence of pericardial effusion. Mitral Valve: The mitral valve is grossly normal. Mild mitral valve regurgitation. No evidence of mitral valve stenosis. Tricuspid Valve: The tricuspid valve is not well visualized. Tricuspid valve regurgitation is trivial. No evidence of tricuspid stenosis. Aortic Valve: The aortic valve was not well visualized. Aortic valve regurgitation is not visualized. No aortic stenosis is present. Aortic valve mean gradient measures 4.0 mmHg. Aortic valve peak gradient measures 11.3 mmHg. Aortic valve area, by VTI measures 2.09 cm. Pulmonic Valve: The pulmonic valve was not well visualized. Pulmonic valve regurgitation is not visualized. No evidence of pulmonic stenosis. Aorta: Aortic root could not be assessed. Venous: The inferior vena cava is normal in size with less than 50% respiratory variability, suggesting right atrial pressure of 8 mmHg. IAS/Shunts: The interatrial septum was not well visualized.  LEFT VENTRICLE PLAX 2D LVOT diam:     1.70 cm     Diastology LV SV:         45          LV e' medial:    7.72 cm/s LV SV Index:   28          LV E/e' medial:  8.4 LVOT Area:     2.27 cm    LV e' lateral:   10.80 cm/s                            LV E/e' lateral: 6.0  LV Volumes (MOD) LV vol d, MOD A2C: 89.6  ml LV vol d, MOD A4C: 95.6 ml LV vol s, MOD A2C: 15.8 ml LV vol s, MOD A4C: 20.8 ml LV SV MOD A2C:     73.8 ml LV SV MOD A4C:     95.6 ml LV SV MOD BP:      76.7 ml RIGHT VENTRICLE RV S prime:     16.50 cm/s TAPSE (M-mode): 1.9 cm LEFT ATRIUM             Index LA Vol (A2C):   31.5 ml 19.38 ml/m LA Vol (A4C):   25.3 ml  15.56 ml/m LA Biplane Vol: 30.0 ml 18.46 ml/m  AORTIC VALVE AV Area (Vmax):    1.85 cm AV Area (Vmean):   2.06 cm AV Area (VTI):     2.09 cm AV Vmax:           168.00 cm/s AV Vmean:          88.000 cm/s AV VTI:            0.214 m AV Peak Grad:      11.3 mmHg AV Mean Grad:      4.0 mmHg LVOT Vmax:         137.00 cm/s LVOT Vmean:        80.000 cm/s LVOT VTI:          0.197 m LVOT/AV VTI ratio: 0.92 MITRAL VALVE MV Area (PHT): 4.04 cm    SHUNTS MV Decel Time: 188 msec    Systemic VTI:  0.20 m MV E velocity: 65.10 cm/s  Systemic Diam: 1.70 cm MV A velocity: 88.10 cm/s MV E/A ratio:  0.74 Sunit Tolia Electronically signed by Olinda Bertrand Signature Date/Time: 08/09/2023/12:44:00 PM    Final    US  Abdomen Limited RUQ (LIVER/GB) Result Date: 08/09/2023 CLINICAL DATA:  Liver failure EXAM: ULTRASOUND ABDOMEN LIMITED RIGHT UPPER QUADRANT COMPARISON:  None Available. FINDINGS: Gallbladder: Gallbladder is well distended with gallbladder sludge. No definitive stones are seen. Mild wall thickening of 4 mm is noted related to underlying ascites. Common bile duct: Diameter: 6.8 mm Liver: Nodularity is noted with increased echogenicity consistent with underlying cirrhosis. Portal vein is patent on color Doppler imaging with normal direction of blood flow towards the liver. Other: Mild ascites is noted. IMPRESSION: Changes of hepatic cirrhosis with associated ascites. Gallbladder sludge without evidence of cholelithiasis. Electronically Signed   By: Violeta Grey M.D.   On: 08/09/2023 10:44   DG Chest Port 1 View Result Date: 08/08/2023 CLINICAL DATA:  Questionable sepsis - evaluate for abnormality EXAM: PORTABLE CHEST 1 VIEW COMPARISON:  None Available. FINDINGS: Overlying artifact limits assessment. Endotracheal tube tip is 4.4 cm from the carina. Tip of the enteric tube is below the diaphragm in the stomach, side-port just beyond the gastroesophageal junction. The heart is normal in size. Mediastinal contours are normal.  There may be small pleural effusions. No pulmonary edema, confluent airspace disease or pneumothorax. IMPRESSION: 1. Endotracheal tube tip 4.4 cm from the carina. 2. Enteric tube tip below the diaphragm in the stomach, side-port just beyond the gastroesophageal junction. 3. Possible small pleural effusions. Electronically Signed   By: Chadwick Colonel M.D.   On: 08/08/2023 18:33    Labs:  CBC: Recent Labs    08/17/23 0744 08/17/23 1351 08/18/23 0608 08/18/23 1228 08/20/23 0338 08/23/23 0822  WBC 17.4*  --   --  15.7* 16.1* 20.4*  HGB 7.9*   < > 9.1* 9.2* 9.5* 8.1*  HCT 23.5*   < >  27.0* 28.2* 28.6* 24.9*  PLT 95*  --   --  111* 119* 151   < > = values in this interval not displayed.    COAGS: Recent Labs    08/15/23 0519 08/16/23 0340 08/17/23 0744 08/23/23 0823  INR 2.4* 1.9* 1.8* 2.0*    BMP: Recent Labs    08/17/23 0155 08/19/23 0522 08/20/23 0338 08/23/23 0822  NA 141 141 142 140  K 3.9 3.2* 3.6 3.4*  CL 111 113* 113* 113*  CO2 19* 20* 19* 20*  GLUCOSE 145* 115* 87 118*  BUN 40* 35* 30* 34*  CALCIUM 8.9 9.3 9.8 9.5  CREATININE 0.80 0.79 0.70 0.90  GFRNONAA >60 >60 >60 >60    LIVER FUNCTION TESTS: Recent Labs    08/17/23 0155 08/19/23 0522 08/20/23 0338 08/23/23 0822  BILITOT 19.4* 18.7* 20.0* 17.1*  AST 155* 110* 115* 106*  ALT 118* 105* 113* 111*  ALKPHOS 63 81 88 91  PROT 5.8* 6.0* 6.1* 5.6*  ALBUMIN  2.4* 2.3* 2.4* 2.2*    TUMOR MARKERS: No results for input(s): "AFPTM", "CEA", "CA199", "CHROMGRNA" in the last 8760 hours.  Assessment and Plan: 74 y.o. male smoker, resident of [facility] nursing facility, who was recently discharged from [facility] on 5/3 following admission for hemorrhagic shock, acute GI bleeding from duodenal ulcer with visible vessel. Had EGD on 08/14/2023 which showed showed large duodenal ulcer, visible vessel with active bleeding treated with epinephrine  injection, cautery, small nonbleeding esophageal varices.   Continued to have bleeding afterwards with melena.  Repeat CTA of abdomen on 08/15/2023 showed no further bleeding.  Patient also with history of cirrhosis with ascites, alcoholic hepatitis, elevated LFTs, thrombocytopenia, hepatic encephalopathy, coagulopathy, protein calorie malnutrition.  He presents back to [facility] today with persistent bloody stools which started within past 24 hours per pt.  CT angiogram revealed:  1. Active high volume upper GI bleeding from duodenal bulb region, as described above. Emergent GI or interventional radiology consultation is recommended. 2. Small left pleural effusion and trace right pleural effusion. 3. Cirrhotic liver configuration and moderate-to-large ascites. 4. Multiple other nonacute observations, as described above.   Aortic Atherosclerosis   Patient currently afebrile, BP 102/69, heart rate 77, respirations 18, WBC 20.4, hemoglobin 8.1, platelets normal.  PT 22.7, INR 2, creatinine 0.90 potassium 3.4.  Left femoral venous catheter in place.  Request now received for emergent vessel/mesenteric arteriogram with possible embolization.  Latest imaging studies have been reviewed by Dr. Jinx Mourning.Risks and benefits of procedure were discussed with the patient/brother including, but not limited to bleeding, infection, vascular injury or contrast induced renal failure.  This interventional procedure involves the use of X-rays and because of the nature of the planned procedure, it is possible that we will have prolonged use of X-ray fluoroscopy.  Potential radiation risks to you include (but are not limited to) the following: - A slightly elevated risk for cancer  several years later in life. This risk is typically less than 0.5% percent. This risk is low in comparison to the normal incidence of human cancer, which is 33% for women and 50% for men according to the [employer]. - Radiation induced injury can include skin redness, resembling a rash,  tissue breakdown / ulcers and hair loss (which can be temporary or permanent).   The likelihood of either of these occurring depends on the difficulty of the procedure and whether you are sensitive to radiation due to previous procedures, disease, or genetic conditions.  IF your procedure requires a prolonged use of radiation, you will be notified and given written instructions for further action.  It is your responsibility to monitor the irradiated area for the 2 weeks following the procedure and to notify your physician if you are concerned that you have suffered a radiation induced injury.    All of the patient's questions were answered, patient is agreeable to proceed.  Consent signed and in chart.  Procedure scheduled for today ASAP.      Thank you for allowing our service to participate in DANNER BUZZA 's care.  Electronically Signed: D. Honore Lux, PA-C   08/23/2023, 11:41 AM      I spent a total of 40 Minutes    in face to face in clinical consultation, greater than 50% of which was counseling/coordinating care for this will/mesenteric arteriogram with possible embolization

## 2023-08-23 NOTE — ED Notes (Signed)
 [facility] returned to [facility] due to delay with Central Line placement and IR.

## 2023-08-23 NOTE — ED Notes (Signed)
 Patient transported to CT

## 2023-08-23 NOTE — ED Notes (Signed)
 Kevin Velez with Blood [facility] aware IR will come pick up blood to start in IR.

## 2023-08-23 NOTE — Progress Notes (Addendum)
       Overnight   NAME: Kevin Velez MRN: [id number] DOB : [birth year]    Date of Service   08/23/2023   HPI/Events of Note    Notified by RN for hypotension/borderline hypotension.  Bedside: Patient is awake and oriented, no blood seen by RN. Fluid bolus completed, Albumin  in process.  Lab result for CBC result:   Latest Reference Range & Units 08/23/23 15:00 08/23/23 18:41 08/23/23 23:14  Hemoglobin 13.0 - 17.0 g/dL 8.4 (L) 8.5 (L) 7.2 (L)  HCT 39.0 - 52.0 % 25.2 (L) 25.0 (L) 21.1 (L)  (L): Data is abnormally low  With GI concern will maintain Hgb 8.   Interventions/ Plan   Transfuse 1 unit of PRBC CBC at time after blood transfusion,  RN to place correct time  GI previously consulted/onboard.      Denece Finger BSN MSNA MSN ACNPC-AG Acute Care Nurse Practitioner [facility]

## 2023-08-23 NOTE — Consult Note (Signed)
 Reason for Consult: GI bleed Referring Physician: Hospital team  Kevin Velez is an 73 y.o. male.  HPI: Patient seen and examined and case discussed with the hospital team ER physician and multiple family members and he is familiar to me from recent hospital stay and thanks to IR for their quick and excellent work and is had bleeding at the rehab center for a few days and is not on any aspirin or nonsteroidals and was prescribed his pantoprazole  on discharge and we answered all the family's questions and the case discussed with his nurse as well and has not had any bleeding since return from IR and we discussed not only his ulcer but his significant liver disease  Past Medical History:  Diagnosis Date   Diabetes mellitus without complication [facility])     Past Surgical History:  Procedure Laterality Date   ESOPHAGOGASTRODUODENOSCOPY N/A 08/14/2023   Procedure: EGD (ESOPHAGOGASTRODUODENOSCOPY);  Surgeon: Felecia Hopper, MD;  Location: [facility];  Service: Gastroenterology;  Laterality: N/A;   HOT HEMOSTASIS N/A 08/14/2023   Procedure: EGD, WITH ARGON PLASMA COAGULATION/GOLD PROBE;  Surgeon: Felecia Hopper, MD;  Location: [facility];  Service: Gastroenterology;  Laterality: N/A;   IR ANGIOGRAM VISCERAL SELECTIVE  08/23/2023   IR EMBO ART  VEN HEMORR LYMPH EXTRAV  INC GUIDE ROADMAPPING  08/23/2023   IR US  GUIDE VASC ACCESS RIGHT  08/23/2023   SCLEROTHERAPY  08/14/2023   Procedure: SCLEROTHERAPY;  Surgeon: Felecia Hopper, MD;  Location: [facility];  Service: Gastroenterology;;    History reviewed. No pertinent family history.  Social History:  reports that he has been smoking. He has never used smokeless tobacco. He reports current alcohol use. No history on file for drug use.  Allergies: No Known Allergies  Medications: I have reviewed the patient's current medications.  Results for orders placed or performed during the hospital encounter of 08/23/23 (from the past 48 hours)  Type  and screen [facility]     Status: None (Preliminary result)   Collection Time: 08/23/23  8:20 AM  Result Value Ref Range   ABO/RH(D) O POS    Antibody Screen NEG    Sample Expiration 08/26/2023,2359    Unit Number [id number]    Blood Component Type RED CELLS,LR    Unit division 00    Status of Unit ALLOCATED    Transfusion Status OK TO TRANSFUSE    Crossmatch Result Compatible    Unit Number [id number]    Blood Component Type RED CELLS,LR    Unit division 00    Status of Unit ISSUED    Transfusion Status OK TO TRANSFUSE    Crossmatch Result      Compatible Performed at [facility], [address] [place], [place] [address]    Unit Number [id number]    Blood Component Type RED CELLS,LR    Unit division 00    Status of Unit ALLOCATED    Transfusion Status OK TO TRANSFUSE    Crossmatch Result Compatible   Comprehensive metabolic panel     Status: Abnormal   Collection Time: 08/23/23  8:22 AM  Result Value Ref Range   Sodium 140 135 - 145 mmol/L   Potassium 3.4 (L) 3.5 - 5.1 mmol/L   Chloride 113 (H) 98 - 111 mmol/L   CO2 20 (L) 22 - 32 mmol/L   Glucose, Bld 118 (H) 70 - 99 mg/dL    Comment: Glucose reference range applies only to samples taken after fasting for at least  8 hours.   BUN 34 (H) 8 - 23 mg/dL   Creatinine, Ser 1.61 0.61 - 1.24 mg/dL   Calcium 9.5 8.9 - 09.6 mg/dL   Total Protein 5.6 (L) 6.5 - 8.1 g/dL   Albumin  2.2 (L) 3.5 - 5.0 g/dL   AST 045 (H) 15 - 41 U/L   ALT 111 (H) 0 - 44 U/L   Alkaline Phosphatase 91 38 - 126 U/L   Total Bilirubin 17.1 (H) 0.0 - 1.2 mg/dL   GFR, Estimated >40 >98 mL/min    Comment: (NOTE) Calculated using the CKD-EPI Creatinine Equation (2021)    Anion gap 7 5 - 15    Comment: Performed at [facility], [address] [place], [place] [address]  CBC     Status: Abnormal   Collection Time: 08/23/23  8:22 AM  Result Value Ref Range   WBC 20.4 (H) 4.0 - 10.5 K/uL    RBC 2.67 (L) 4.22 - 5.81 MIL/uL   Hemoglobin 8.1 (L) 13.0 - 17.0 g/dL   HCT 78.2 (L) 95.6 - 21.3 %   MCV 93.3 80.0 - 100.0 fL   MCH 30.3 26.0 - 34.0 pg   MCHC 32.5 30.0 - 36.0 g/dL   RDW 08.6 (H) 57.8 - 46.9 %   Platelets 151 150 - 400 K/uL   nRBC 0.0 0.0 - 0.2 %    Comment: Performed at [facility], [address] [place], [place] [address]  Protime-INR     Status: Abnormal   Collection Time: 08/23/23  8:23 AM  Result Value Ref Range   Prothrombin Time 22.7 (H) 11.4 - 15.2 seconds   INR 2.0 (H) 0.8 - 1.2    Comment: (NOTE) INR goal varies based on device and disease states. Performed at [facility], [address] [place], [place] [address]   Prepare fresh frozen plasma     Status: None (Preliminary result)   Collection Time: 08/23/23  9:29 AM  Result Value Ref Range   Unit Number [id number]    Blood Component Type THW PLS APHR    Unit division B0    Status of Unit ISSUED    Transfusion Status OK TO TRANSFUSE    Unit Number [id number]    Blood Component Type THW PLS APHR    Unit division B0    Status of Unit ISSUED    Transfusion Status      OK TO TRANSFUSE Performed at [facility], [address], [place], [place] [address]   POC occult blood, ED     Status: Abnormal   Collection Time: 08/23/23 10:07 AM  Result Value Ref Range   Fecal Occult Bld POSITIVE (A) NEGATIVE  Prepare RBC (crossmatch)     Status: None   Collection Time: 08/23/23 10:49 AM  Result Value Ref Range   Order Confirmation      ORDER PROCESSED BY BLOOD BANK Performed at [facility], [address], [place], [place] [address]   Hemoglobin and hematocrit, blood     Status: Abnormal   Collection Time: 08/23/23  3:00 PM  Result Value Ref Range   Hemoglobin 8.4 (L) 13.0 - 17.0 g/dL   HCT 88.4 (L) 16.6 - 06.3 %    Comment: Performed at [facility], [address] [place], [place] [address]   Basic metabolic panel     Status: Abnormal   Collection Time: 08/23/23  3:00 PM  Result Value Ref Range  Sodium 140 135 - 145 mmol/L   Potassium 3.1 (L) 3.5 - 5.1 mmol/L   Chloride 112 (H) 98 - 111 mmol/L   CO2 18 (L) 22 - 32 mmol/L   Glucose, Bld 112 (H) 70 - 99 mg/dL    Comment: Glucose reference range applies only to samples taken after fasting for at least 8 hours.   BUN 33 (H) 8 - 23 mg/dL   Creatinine, Ser 1.61 0.61 - 1.24 mg/dL   Calcium 9.2 8.9 - 09.6 mg/dL   GFR, Estimated >04 >54 mL/min    Comment: (NOTE) Calculated using the CKD-EPI Creatinine Equation (2021)    Anion gap 10 5 - 15    Comment: Performed at [facility], [address] [place], [place] [address]  Magnesium      Status: None   Collection Time: 08/23/23  3:00 PM  Result Value Ref Range   Magnesium  1.8 1.7 - 2.4 mg/dL    Comment: Performed at [facility], [address], [place], [place] [address]  Phosphorus     Status: None   Collection Time: 08/23/23  3:00 PM  Result Value Ref Range   Phosphorus 3.9 2.5 - 4.6 mg/dL    Comment: ICTERUS AT THIS LEVEL MAY AFFECT RESULT Performed at [facility], [address], [place], [place] [address]   Ammonia     Status: None   Collection Time: 08/23/23  3:00 PM  Result Value Ref Range   Ammonia 34 9 - 35 umol/L    Comment: Performed at [facility], [address], [place], [place] [address]    IR EMBO ART  VEN HEMORR LYMPH EXTRAV  INC GUIDE ROADMAPPING Result Date: 08/23/2023 INDICATION: 73 year old male with history of acute gastrointestinal hemorrhage and history of duodenal ulcer with CTA evidence of active extravasation from the proximal duodenum. EXAM: 1. Ultrasound-guided vascular access of the right common femoral artery. 2. Selective catheterization and angiography of the celiac artery, gastroduodenal artery, right hepatic artery, and superior mesenteric artery. 3. Coil  embolization of the gastroduodenal artery. MEDICATIONS: None. ANESTHESIA/SEDATION: Moderate (conscious) sedation was employed during this procedure. A total of Versed 2 mg and Fentanyl  100 mcg was administered intravenously. Moderate Sedation Time: 56 minutes. The patient's level of consciousness and vital signs were monitored continuously by radiology nursing throughout the procedure under my direct supervision. CONTRAST:  84mL OMNIPAQUE  IOHEXOL  300 MG/ML SOLN, 25mL OMNIPAQUE  IOHEXOL  350 MG/ML SOLN FLUOROSCOPY: Radiation Exposure Index (as provided by the fluoroscopic device): 1,742 mGy Kerma COMPLICATIONS: None immediate. PROCEDURE: Informed consent was obtained from the patient following explanation of the procedure, risks, benefits and alternatives. The patient understands, agrees and consents for the procedure. All questions were addressed. A time out was performed prior to the initiation of the procedure. Maximal barrier sterile technique utilized including caps, mask, sterile gowns, sterile gloves, large sterile drape, hand hygiene, and Betadine prep. Preprocedure ultrasound evaluation of the right common femoral artery demonstrated patency. The procedure was planned. Subdermal Local anesthesia was administered at the planned needle entry site with 1% lidocaine . A small skin nick was made. Under direct ultrasound visualization, a 21 gauge micropuncture needle was introduced in the right common femoral artery. A permanent ultrasound image was captured and stored in the record. A micropuncture sheath was introduced. Limited right lower extremity angiogram was performed which demonstrated adequate puncture site for closure device use. A Bentson wire was introduced and the micropuncture sheath was exchanged for a 5 [place] vascular  sheath. A C2 catheter was advanced over the Bentson wire and directed to the level of the celiac trunk. Celiac angiogram was performed which demonstrated conventional anatomy and  patency. Using a 2.4 [place] Progreat microcatheter and fathom 16 microwire the gastroduodenal artery was selected. The catheter was able to be advanced into the pancreatic duodenal arcade and gastroepiploic artery. Coil embolization was then performed about the distal gastroduodenal artery with multiple detachable low profile Ruby coils. These coils were seen to migrate into the gastroepiploic artery. The catheter was retracted further an additional Ruby microcoils were deployed to the level of the ostium of the gastroduodenal artery. Attempts were made at catheterization of the cystic artery which were unsuccessful. Angiogram was performed from the base celiac catheter which demonstrated complete embolization of the gastroduodenal artery without evidence of hemorrhage in the region of the proximal duodenum. The catheter was repositioned to the superior mesenteric artery. Angiogram was performed which demonstrated patency of the main superior mesenteric artery and its proximal branches. No evidence of active extravasation of the region of the duodenum. The catheter was removed. The sheath was exchanged for a 6 French Angio-Seal device which was deployed without complication. Peripheral pulses were unchanged. A sterile bandage was applied. The patient tolerated the procedure well was transferred back to the emergency room in good condition. IMPRESSION: Technically successful coil embolization of the gastroduodenal artery. No active extravasation was seen on angiography. Creasie Doctor, MD [facility] Electronically Signed   By: Creasie Doctor M.D.   On: 08/23/2023 16:14   IR Angiogram Visceral Selective Result Date: 08/23/2023 INDICATION: 73 year old male with history of acute gastrointestinal hemorrhage and history of duodenal ulcer with CTA evidence of active extravasation from the proximal duodenum. EXAM: 1. Ultrasound-guided vascular access of the right  common femoral artery. 2. Selective catheterization and angiography of the celiac artery, gastroduodenal artery, right hepatic artery, and superior mesenteric artery. 3. Coil embolization of the gastroduodenal artery. MEDICATIONS: None. ANESTHESIA/SEDATION: Moderate (conscious) sedation was employed during this procedure. A total of Versed 2 mg and Fentanyl  100 mcg was administered intravenously. Moderate Sedation Time: 56 minutes. The patient's level of consciousness and vital signs were monitored continuously by radiology nursing throughout the procedure under my direct supervision. CONTRAST:  84mL OMNIPAQUE  IOHEXOL  300 MG/ML SOLN, 25mL OMNIPAQUE  IOHEXOL  350 MG/ML SOLN FLUOROSCOPY: Radiation Exposure Index (as provided by the fluoroscopic device): 1,742 mGy Kerma COMPLICATIONS: None immediate. PROCEDURE: Informed consent was obtained from the patient following explanation of the procedure, risks, benefits and alternatives. The patient understands, agrees and consents for the procedure. All questions were addressed. A time out was performed prior to the initiation of the procedure. Maximal barrier sterile technique utilized including caps, mask, sterile gowns, sterile gloves, large sterile drape, hand hygiene, and Betadine prep. Preprocedure ultrasound evaluation of the right common femoral artery demonstrated patency. The procedure was planned. Subdermal Local anesthesia was administered at the planned needle entry site with 1% lidocaine . A small skin nick was made. Under direct ultrasound visualization, a 21 gauge micropuncture needle was introduced in the right common femoral artery. A permanent ultrasound image was captured and stored in the record. A micropuncture sheath was introduced. Limited right lower extremity angiogram was performed which demonstrated adequate puncture site for closure device use. A Bentson wire was introduced and the micropuncture sheath was exchanged for a 5 [place] vascular sheath. A  C2 catheter was advanced over the Bentson wire and directed to the level of the celiac trunk. Celiac  angiogram was performed which demonstrated conventional anatomy and patency. Using a 2.4 [place] Progreat microcatheter and fathom 16 microwire the gastroduodenal artery was selected. The catheter was able to be advanced into the pancreatic duodenal arcade and gastroepiploic artery. Coil embolization was then performed about the distal gastroduodenal artery with multiple detachable low profile Ruby coils. These coils were seen to migrate into the gastroepiploic artery. The catheter was retracted further an additional Ruby microcoils were deployed to the level of the ostium of the gastroduodenal artery. Attempts were made at catheterization of the cystic artery which were unsuccessful. Angiogram was performed from the base celiac catheter which demonstrated complete embolization of the gastroduodenal artery without evidence of hemorrhage in the region of the proximal duodenum. The catheter was repositioned to the superior mesenteric artery. Angiogram was performed which demonstrated patency of the main superior mesenteric artery and its proximal branches. No evidence of active extravasation of the region of the duodenum. The catheter was removed. The sheath was exchanged for a 6 French Angio-Seal device which was deployed without complication. Peripheral pulses were unchanged. A sterile bandage was applied. The patient tolerated the procedure well was transferred back to the emergency room in good condition. IMPRESSION: Technically successful coil embolization of the gastroduodenal artery. No active extravasation was seen on angiography. Creasie Doctor, MD [facility] Electronically Signed   By: Creasie Doctor M.D.   On: 08/23/2023 16:14   IR US  Guide Vasc Access Right Result Date: 08/23/2023 INDICATION: 73 year old male with history of acute gastrointestinal  hemorrhage and history of duodenal ulcer with CTA evidence of active extravasation from the proximal duodenum. EXAM: 1. Ultrasound-guided vascular access of the right common femoral artery. 2. Selective catheterization and angiography of the celiac artery, gastroduodenal artery, right hepatic artery, and superior mesenteric artery. 3. Coil embolization of the gastroduodenal artery. MEDICATIONS: None. ANESTHESIA/SEDATION: Moderate (conscious) sedation was employed during this procedure. A total of Versed 2 mg and Fentanyl  100 mcg was administered intravenously. Moderate Sedation Time: 56 minutes. The patient's level of consciousness and vital signs were monitored continuously by radiology nursing throughout the procedure under my direct supervision. CONTRAST:  84mL OMNIPAQUE  IOHEXOL  300 MG/ML SOLN, 25mL OMNIPAQUE  IOHEXOL  350 MG/ML SOLN FLUOROSCOPY: Radiation Exposure Index (as provided by the fluoroscopic device): 1,742 mGy Kerma COMPLICATIONS: None immediate. PROCEDURE: Informed consent was obtained from the patient following explanation of the procedure, risks, benefits and alternatives. The patient understands, agrees and consents for the procedure. All questions were addressed. A time out was performed prior to the initiation of the procedure. Maximal barrier sterile technique utilized including caps, mask, sterile gowns, sterile gloves, large sterile drape, hand hygiene, and Betadine prep. Preprocedure ultrasound evaluation of the right common femoral artery demonstrated patency. The procedure was planned. Subdermal Local anesthesia was administered at the planned needle entry site with 1% lidocaine . A small skin nick was made. Under direct ultrasound visualization, a 21 gauge micropuncture needle was introduced in the right common femoral artery. A permanent ultrasound image was captured and stored in the record. A micropuncture sheath was introduced. Limited right lower extremity angiogram was performed which  demonstrated adequate puncture site for closure device use. A Bentson wire was introduced and the micropuncture sheath was exchanged for a 5 [place] vascular sheath. A C2 catheter was advanced over the Bentson wire and directed to the level of the celiac trunk. Celiac angiogram was performed which demonstrated conventional anatomy and patency. Using a 2.4 [place] Progreat microcatheter and fathom 16 microwire  the gastroduodenal artery was selected. The catheter was able to be advanced into the pancreatic duodenal arcade and gastroepiploic artery. Coil embolization was then performed about the distal gastroduodenal artery with multiple detachable low profile Ruby coils. These coils were seen to migrate into the gastroepiploic artery. The catheter was retracted further an additional Ruby microcoils were deployed to the level of the ostium of the gastroduodenal artery. Attempts were made at catheterization of the cystic artery which were unsuccessful. Angiogram was performed from the base celiac catheter which demonstrated complete embolization of the gastroduodenal artery without evidence of hemorrhage in the region of the proximal duodenum. The catheter was repositioned to the superior mesenteric artery. Angiogram was performed which demonstrated patency of the main superior mesenteric artery and its proximal branches. No evidence of active extravasation of the region of the duodenum. The catheter was removed. The sheath was exchanged for a 6 French Angio-Seal device which was deployed without complication. Peripheral pulses were unchanged. A sterile bandage was applied. The patient tolerated the procedure well was transferred back to the emergency room in good condition. IMPRESSION: Technically successful coil embolization of the gastroduodenal artery. No active extravasation was seen on angiography. Creasie Doctor, MD [facility]iology Electronically Signed   By:  Creasie Doctor M.D.   On: 08/23/2023 16:14   CT ANGIO GI BLEED Result Date: 08/23/2023 CLINICAL DATA:  Duodenal ulcer melena, active bleeding. EXAM: CTA ABDOMEN AND PELVIS WITHOUT AND WITH CONTRAST TECHNIQUE: Multidetector CT imaging of the abdomen and pelvis was performed using the standard protocol during bolus administration of intravenous contrast. Multiplanar reconstructed images and MIPs were obtained and reviewed to evaluate the vascular anatomy. RADIATION DOSE REDUCTION: This exam was performed according to the departmental dose-optimization program which includes automated exposure control, adjustment of the mA and/or kV according to patient size and/or use of iterative reconstruction technique. CONTRAST:  OMNIPAQUE  IOHEXOL  350 MG/ML SOLN COMPARISON:  CT angiography abdomen and pelvis from 08/15/2023. FINDINGS: VASCULAR Aorta: Normal caliber aorta without aneurysm, dissection, vasculitis or significant stenosis. Celiac: Patent without evidence of aneurysm, dissection, vasculitis or significant stenosis. There is a small branch arising from the common hepatic artery which extends up to the duodenal bulb region which is associated with active extravasation of contrast with contras blush measuring up to 1.1 x 1.9 cm on arterial phase images, which slightly increases in size on the portal venous phase images and markedly increases on the delayed images with extension up to the duodeno-jejunal junction region, compatible with source of high volume active GI bleeding. SMA: Patent without evidence of aneurysm, dissection, vasculitis or significant stenosis. Renals: Both renal arteries are patent without evidence of aneurysm, dissection, vasculitis, fibromuscular dysplasia or significant stenosis. IMA: Patent without evidence of aneurysm, dissection, vasculitis or significant stenosis. Inflow: Patent without evidence of aneurysm, dissection, vasculitis or significant stenosis. Proximal Outflow: Bilateral  common femoral and visualized portions of the superficial and profunda femoral arteries are patent without evidence of aneurysm, dissection, vasculitis or significant stenosis. Veins: No obvious venous abnormality within the limitations of this arterial phase study. Review of the MIP images confirms the above findings. NON-VASCULAR Lower chest: There is small left pleural effusion with associated compressive changes at the left lung base. There is trace right pleural effusion. Bilateral lung bases are otherwise clear. Normal cardiac size. No pericardial effusion. There is apparent hypoattenuation of the blood pool relative to the myocardium, suggestive of anemia. Hepatobiliary: The liver is normal in size. There is liver  surface irregularity/nodularity, compatible with cirrhotic configuration. No intrahepatic or extrahepatic bile duct dilation. There are layering hyperattenuating areas in the gallbladder, likely vicarious excretion of previously administered intravenous contrast. No abnormal wall thickening or pericholecystic fat stranding. No imaging evidence of acute cholecystitis. Pancreas: Unremarkable. No pancreatic ductal dilatation or surrounding inflammatory changes. Spleen: Within normal limits. No focal lesion. Adrenals/Urinary Tract: Adrenal glands are unremarkable. No suspicious renal mass. There are several simple cysts throughout bilateral kidneys with largest measuring up to 2.3 x 3.3 cm arising from the right kidney interpolar region, laterally. No hydroureteronephrosis or nephroureterolithiasis. Unremarkable urinary bladder. Stomach/Bowel: No disproportionate dilation of the small or large bowel loops. No evidence of abnormal bowel wall thickening or inflammatory changes. The appendix is unremarkable. There are multiple diverticula throughout the colon, without imaging signs of diverticulitis. Vascular/Lymphatic: There is moderate to large ascites. No pneumoperitoneum. No abdominal or pelvic  lymphadenopathy, by size criteria. No aneurysmal dilation of the major abdominal arteries. There are mild peripheral atherosclerotic vascular calcifications of the aorta and its major branches. Reproductive: Normal size prostate. Symmetric seminal vesicles. Other: Moderate anasarca. The soft tissues and abdominal wall are otherwise unremarkable. Musculoskeletal: No suspicious osseous lesions. There are mild - moderate multilevel degenerative changes in the visualized spine. IMPRESSION: 1. Active high volume upper GI bleeding from duodenal bulb region, as described above. Emergent GI or interventional radiology consultation is recommended. 2. Small left pleural effusion and trace right pleural effusion. 3. Cirrhotic liver configuration and moderate-to-large ascites. 4. Multiple other nonacute observations, as described above. Aortic Atherosclerosis (ICD10-I70.0). Critical Value/emergent results were called by telephone at the time of interpretation on 08/23/2023 at 10:48 am to provider [facility] , who verbally acknowledged these results. Electronically Signed   By: Beula Brunswick M.D.   On: 08/23/2023 10:49    ROS negative except above Blood pressure 126/78, pulse 91, temperature (!) 96.1 F (35.6 C), temperature source Axillary, resp. rate (!) 21, height 5\' 7"  (1.702 m), weight 62.3 kg, SpO2 99%. Physical Exam vital signs stable afebrile no acute distress abdomen is soft nontender labs reviewed BUN and creatinine about the same hemoglobin dropped from 9 5-8.1 INR 2.0 was 1.9 liver tests overall about the same expect bili to rise after transfusion CTA reviewed IR note reviewed  Assessment/Plan: Duodenal ulcer with bleeding patient with probable cirrhosis and alcoholic hepatitis Plan: If no signs of bleeding in the a.m. may wean octreotide  will allow clear liquids and continue Protonix  and consider adding Carafate  and he needs 2 more weeks of prednisone for his alcoholic hepatitis and will be on standby  if signs of ongoing bleeding continue for repeat endoscopy will check on tomorrow  [facility] E 08/23/2023, 5:57 PM

## 2023-08-23 NOTE — ED Triage Notes (Signed)
 Patient to ED by EMS from [facility] for bloody stools. Per patient it started this morning. He began to pass large bloody clots and black stools. He voices tightness to ABD area, area is rigidity and distended. HX of Gerd and HTN.

## 2023-08-23 NOTE — Procedures (Signed)
 Interventional Radiology Procedure Note  Procedure:  1) Mesenteric angiogram 2) Gastroduodenal artery coil embolization   Findings: Please refer to procedural dictation for full description. No active extravasation visualized on angiogram, though GDA very irregular and diminutive. Technically successful prophylactic coil embolization.  Right CFA 6 Fr Angioseal closure.  Complications: None immediate  Estimated Blood Loss: < 5 ml  Recommendations: Strict 4 hour bedrest (2 hours flat, 2 hours head of bed up to 30 degrees). Continue to trend H/H, resuscitate as needed. IR will follow.   Creasie Doctor, MD

## 2023-08-23 NOTE — ED Notes (Signed)
 Patient transported to IR

## 2023-08-23 NOTE — ED Provider Notes (Signed)
 [place] EMERGENCY DEPARTMENT AT [facility] Provider Note   CSN: [id number] Arrival date & time: 08/23/23  0725     History  Chief Complaint  Patient presents with   GI Bleeding   Rectal Bleeding    Kevin Velez is a 73 y.o. male.  HPI    73 years old male  with a recent diagnosis of liver cirrhosis, upper GI bleed.  During the endoscopy patient was found to have duodenal ulcer and grade 1 varices.  Patient required transfusion of blood during the admission.  He was discharged to nursing home.  Nursing home sent patient to the ER because of dark stool.  Patient's hemodynamics are stable.  Shock index less than 1.  Systolic blood pressure in the 90-110 range.  Patient had a large volume melena here.  GI consulted.  CT angiogram ordered. Patient has 1 IV access, central line was placed.  CT scan revealed active bleeding, IR consulted.  IR to consult for admission once procedure completed.   Home Medications Prior to Admission medications   Medication Sig Start Date End Date Taking? Authorizing Provider  ascorbic acid (VITAMIN C) 500 MG tablet Take 500-1,000 mg by mouth daily.    [provider]  cyanocobalamin (VITAMIN B12) 1000 MCG tablet Take 1,000 mcg by mouth daily.    [provider]  folic acid  (FOLVITE ) 1 MG tablet Take 1 tablet (1 mg total) by mouth daily. 08/21/23   Audria Leather, MD  lactulose  (CHRONULAC ) 10 GM/15ML solution Take 30 mLs (20 g total) by mouth 2 (two) times daily. 08/20/23   Audria Leather, MD  midodrine  (PROAMATINE ) 10 MG tablet Take 1 tablet (10 mg total) by mouth 3 (three) times daily with meals. 08/20/23   Audria Leather, MD  Multiple Vitamin (MULTIVITAMIN WITH MINERALS) TABS tablet Take 1 tablet by mouth daily. 08/21/23   Audria Leather, MD  pantoprazole  (PROTONIX ) 40 MG tablet Take 1 tablet (40 mg total) by mouth 2 (two) times daily before a meal. 08/20/23 10/19/23  Audria Leather, MD  polyethylene glycol (MIRALAX  /  GLYCOLAX ) 17 g packet Take 17 g by mouth daily as needed for moderate constipation. 08/20/23   Audria Leather, MD  prednisoLONE  5 MG TABS tablet Take 8 tablets (40 mg total) by mouth daily for 16 days. 08/21/23 09/06/23  Audria Leather, MD  sucralfate  (CARAFATE ) 1 GM/10ML suspension Take 10 mLs (1 g total) by mouth 4 (four) times daily -  with meals and at bedtime. 08/20/23   Audria Leather, MD  thiamine  (VITAMIN B-1) 100 MG tablet Take 1 tablet (100 mg total) by mouth daily. 08/21/23   Audria Leather, MD      Allergies    Patient has no known allergies.    Review of Systems   Review of Systems  All other systems reviewed and are negative.   Physical Exam Updated Vital Signs BP 102/69   Pulse 77   Temp (!) 97.5 F (36.4 C) (Oral)   Resp 18   Ht 5\' 7"  (1.702 m)   Wt 50.8 kg   SpO2 98%   BMI 17.54 kg/m  Physical Exam Vitals and nursing note reviewed.  Constitutional:      Appearance: He is well-developed.  HENT:     Head: Atraumatic.  Eyes:     General: Scleral icterus present.  Cardiovascular:     Rate and Rhythm: Normal rate.  Pulmonary:     Effort: Pulmonary effort is normal.  Abdominal:  General: There is distension.  Musculoskeletal:     Cervical back: Neck supple.  Skin:    General: Skin is warm.  Neurological:     Mental Status: He is alert and oriented to person, place, and time.     ED Results / Procedures / Treatments   Labs (all labs ordered are listed, but only abnormal results are displayed) Labs Reviewed  COMPREHENSIVE METABOLIC PANEL WITH GFR - Abnormal; Notable for the following components:      Result Value   Potassium 3.4 (*)    Chloride 113 (*)    CO2 20 (*)    Glucose, Bld 118 (*)    BUN 34 (*)    Total Protein 5.6 (*)    Albumin  2.2 (*)    AST 106 (*)    ALT 111 (*)    Total Bilirubin 17.1 (*)    All other components within normal limits  CBC - Abnormal; Notable for the following components:   WBC 20.4 (*)    RBC 2.67 (*)    Hemoglobin  8.1 (*)    HCT 24.9 (*)    RDW 21.2 (*)    All other components within normal limits  PROTIME-INR - Abnormal; Notable for the following components:   Prothrombin Time 22.7 (*)    INR 2.0 (*)    All other components within normal limits  POC OCCULT BLOOD, ED - Abnormal; Notable for the following components:   Fecal Occult Bld POSITIVE (*)    All other components within normal limits  CBC  TYPE AND SCREEN  PREPARE FRESH FROZEN PLASMA  PREPARE RBC (CROSSMATCH)    EKG None  Radiology CT ANGIO GI BLEED Result Date: 08/23/2023 CLINICAL DATA:  Duodenal ulcer melena, active bleeding. EXAM: CTA ABDOMEN AND PELVIS WITHOUT AND WITH CONTRAST TECHNIQUE: Multidetector CT imaging of the abdomen and pelvis was performed using the standard protocol during bolus administration of intravenous contrast. Multiplanar reconstructed images and MIPs were obtained and reviewed to evaluate the vascular anatomy. RADIATION DOSE REDUCTION: This exam was performed according to the departmental dose-optimization program which includes automated exposure control, adjustment of the mA and/or kV according to patient size and/or use of iterative reconstruction technique. CONTRAST:  OMNIPAQUE  IOHEXOL  350 MG/ML SOLN COMPARISON:  CT angiography abdomen and pelvis from 08/15/2023. FINDINGS: VASCULAR Aorta: Normal caliber aorta without aneurysm, dissection, vasculitis or significant stenosis. Celiac: Patent without evidence of aneurysm, dissection, vasculitis or significant stenosis. There is a small branch arising from the common hepatic artery which extends up to the duodenal bulb region which is associated with active extravasation of contrast with contras blush measuring up to 1.1 x 1.9 cm on arterial phase images, which slightly increases in size on the portal venous phase images and markedly increases on the delayed images with extension up to the duodeno-jejunal junction region, compatible with source of high volume active  GI bleeding. SMA: Patent without evidence of aneurysm, dissection, vasculitis or significant stenosis. Renals: Both renal arteries are patent without evidence of aneurysm, dissection, vasculitis, fibromuscular dysplasia or significant stenosis. IMA: Patent without evidence of aneurysm, dissection, vasculitis or significant stenosis. Inflow: Patent without evidence of aneurysm, dissection, vasculitis or significant stenosis. Proximal Outflow: Bilateral common femoral and visualized portions of the superficial and profunda femoral arteries are patent without evidence of aneurysm, dissection, vasculitis or significant stenosis. Veins: No obvious venous abnormality within the limitations of this arterial phase study. Review of the MIP images confirms the above findings. NON-VASCULAR Lower chest: There is  small left pleural effusion with associated compressive changes at the left lung base. There is trace right pleural effusion. Bilateral lung bases are otherwise clear. Normal cardiac size. No pericardial effusion. There is apparent hypoattenuation of the blood pool relative to the myocardium, suggestive of anemia. Hepatobiliary: The liver is normal in size. There is liver surface irregularity/nodularity, compatible with cirrhotic configuration. No intrahepatic or extrahepatic bile duct dilation. There are layering hyperattenuating areas in the gallbladder, likely vicarious excretion of previously administered intravenous contrast. No abnormal wall thickening or pericholecystic fat stranding. No imaging evidence of acute cholecystitis. Pancreas: Unremarkable. No pancreatic ductal dilatation or surrounding inflammatory changes. Spleen: Within normal limits. No focal lesion. Adrenals/Urinary Tract: Adrenal glands are unremarkable. No suspicious renal mass. There are several simple cysts throughout bilateral kidneys with largest measuring up to 2.3 x 3.3 cm arising from the right kidney interpolar region, laterally. No  hydroureteronephrosis or nephroureterolithiasis. Unremarkable urinary bladder. Stomach/Bowel: No disproportionate dilation of the small or large bowel loops. No evidence of abnormal bowel wall thickening or inflammatory changes. The appendix is unremarkable. There are multiple diverticula throughout the colon, without imaging signs of diverticulitis. Vascular/Lymphatic: There is moderate to large ascites. No pneumoperitoneum. No abdominal or pelvic lymphadenopathy, by size criteria. No aneurysmal dilation of the major abdominal arteries. There are mild peripheral atherosclerotic vascular calcifications of the aorta and its major branches. Reproductive: Normal size prostate. Symmetric seminal vesicles. Other: Moderate anasarca. The soft tissues and abdominal wall are otherwise unremarkable. Musculoskeletal: No suspicious osseous lesions. There are mild - moderate multilevel degenerative changes in the visualized spine. IMPRESSION: 1. Active high volume upper GI bleeding from duodenal bulb region, as described above. Emergent GI or interventional radiology consultation is recommended. 2. Small left pleural effusion and trace right pleural effusion. 3. Cirrhotic liver configuration and moderate-to-large ascites. 4. Multiple other nonacute observations, as described above. Aortic Atherosclerosis (ICD10-I70.0). Critical Value/emergent results were called by telephone at the time of interpretation on 08/23/2023 at 10:48 am to provider [facility] , who verbally acknowledged these results. Electronically Signed   By: Beula Brunswick M.D.   On: 08/23/2023 10:49    Procedures Central Line  Date/Time: 08/23/2023 11:50 AM  Performed by: Deatra Face, MD Authorized by: Deatra Face, MD   Consent:    Consent obtained:  Verbal   Consent given by:  Patient   Risks, benefits, and alternatives were discussed: yes     Risks discussed:  Arterial puncture, bleeding, infection and incorrect placement   Alternatives  discussed:  No treatment Universal protocol:    Procedure explained and questions answered to patient or proxy's satisfaction: yes     Relevant documents present and verified: yes     Test results available: yes     Imaging studies available: yes     Required blood products, implants, devices, and special equipment available: yes     Site/side marked: yes     Immediately prior to procedure, a time out was called: yes     Patient identity confirmed:  Arm band Pre-procedure details:    Indication(s): central venous access and insufficient peripheral access     Hand hygiene: Hand hygiene performed prior to insertion     Sterile barrier technique: All elements of maximal sterile technique followed     Skin preparation:  Chlorhexidine    Skin preparation agent: Skin preparation agent completely dried prior to procedure   Sedation:    Sedation type:  None Anesthesia:    Anesthesia method:  Local infiltration  Local anesthetic:  Lidocaine  1% w/o epi Procedure details:    Location:  L femoral   Site selection rationale:  Elevated INR increasing bleeding risk, pending IR procedure   Patient position:  Supine   Procedural supplies:  Triple lumen   Landmarks identified: yes     Ultrasound guidance: yes     Ultrasound guidance timing: prior to insertion     Sterile ultrasound techniques: Sterile gel and sterile probe covers were used     Number of attempts:  1   Successful placement: yes   Post-procedure details:    Post-procedure:  Line sutured and dressing applied   Assessment:  Blood return through all ports   Procedure completion:  Tolerated well, no immediate complications .Critical Care  Performed by: Deatra Face, MD Authorized by: Deatra Face, MD   Critical care provider statement:    Critical care time (minutes):  41   Critical care was necessary to treat or prevent imminent or life-threatening deterioration of the following conditions:  Circulatory failure and hepatic  failure   Critical care was time spent personally by me on the following activities:  Development of treatment plan with patient or surrogate, discussions with consultants, evaluation of patient's response to treatment, examination of patient, ordering and review of laboratory studies, ordering and review of radiographic studies, ordering and performing treatments and interventions, pulse oximetry, re-evaluation of patient's condition, review of old charts and obtaining history from patient or surrogate     Medications Ordered in ED Medications  octreotide  (SANDOSTATIN ) 2 mcg/mL load via infusion 50 mcg (50 mcg Intravenous Bolus from Bag 08/23/23 0907)    And  octreotide  (SANDOSTATIN ) 500 mcg in sodium chloride  0.9 % 250 mL (2 mcg/mL) infusion (0 mcg/hr Intravenous Stopped 08/23/23 1013)  0.9 %  sodium chloride  infusion (Manually program via Guardrails IV Fluids) (has no administration in time range)  pantoprazole  (PROTONIX ) injection 80 mg (80 mg Intravenous Given 08/23/23 0841)  phytonadione  (VITAMIN K) 10 mg in dextrose  5 % 50 mL IVPB (0 mg Intravenous Stopped 08/23/23 1135)  cefTRIAXone  (ROCEPHIN ) 1 g in sodium chloride  0.9 % 100 mL IVPB (0 g Intravenous Stopped 08/23/23 1143)  iohexol  (OMNIPAQUE ) 350 MG/ML injection 100 mL (100 mLs Intravenous Contrast Given 08/23/23 1002)    ED Course/ Medical Decision Making/ A&P Clinical Course as of 08/23/23 1155  Tue Aug 23, 2023  0909 Hemoglobin(!): 8.1 [CH]    Clinical Course User Index [CH] Hinnant, Ruta Cousins, PA-C                                 Medical Decision Making Amount and/or Complexity of Data Reviewed Labs: ordered. Decision-making details documented in ED Course. Radiology: ordered.  Risk Prescription drug management. Decision regarding hospitalization.    Differential diagnosis for this patient includes Variceal bleeding PUD/Gastritis/ulcers  Patient clinically ill, high risk for decompensation. Iv ceftriaxone , protonix , 2 units  of FFP, vitamin K, octreotide  given.    GI consulted. IR to take the patient given active bleed.  1 unit blood ordered. IR team to administer. Asked for 2 additional units to be held.   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

## 2023-08-24 DIAGNOSIS — D62 Acute posthemorrhagic anemia: Secondary | ICD-10-CM | POA: Diagnosis not present

## 2023-08-24 DIAGNOSIS — K7031 Alcoholic cirrhosis of liver with ascites: Secondary | ICD-10-CM

## 2023-08-24 DIAGNOSIS — K922 Gastrointestinal hemorrhage, unspecified: Secondary | ICD-10-CM | POA: Diagnosis not present

## 2023-08-24 LAB — CBC
HCT: 26.1 % — ABNORMAL LOW (ref 39.0–52.0)
Hemoglobin: 9 g/dL — ABNORMAL LOW (ref 13.0–17.0)
MCH: 30.7 pg (ref 26.0–34.0)
MCHC: 34.5 g/dL (ref 30.0–36.0)
MCV: 89.1 fL (ref 80.0–100.0)
Platelets: 95 10*3/uL — ABNORMAL LOW (ref 150–400)
RBC: 2.93 MIL/uL — ABNORMAL LOW (ref 4.22–5.81)
RDW: 19 % — ABNORMAL HIGH (ref 11.5–15.5)
WBC: 13.2 10*3/uL — ABNORMAL HIGH (ref 4.0–10.5)
nRBC: 0 % (ref 0.0–0.2)

## 2023-08-24 LAB — URINALYSIS, ROUTINE W REFLEX MICROSCOPIC
Glucose, UA: NEGATIVE mg/dL
Hgb urine dipstick: NEGATIVE
Ketones, ur: NEGATIVE mg/dL
Leukocytes,Ua: NEGATIVE
Nitrite: NEGATIVE
Protein, ur: NEGATIVE mg/dL
Specific Gravity, Urine: >1.046 — ABNORMAL HIGH (ref 1.005–1.030)
pH: 6 (ref 5.0–8.0)

## 2023-08-24 LAB — COMPREHENSIVE METABOLIC PANEL WITH GFR
ALT: 112 U/L — ABNORMAL HIGH (ref 0–44)
AST: 133 U/L — ABNORMAL HIGH (ref 15–41)
Albumin: 2 g/dL — ABNORMAL LOW (ref 3.5–5.0)
Alkaline Phosphatase: 74 U/L (ref 38–126)
Anion gap: 9 (ref 5–15)
BUN: 28 mg/dL — ABNORMAL HIGH (ref 8–23)
CO2: 15 mmol/L — ABNORMAL LOW (ref 22–32)
Calcium: 8.8 mg/dL — ABNORMAL LOW (ref 8.9–10.3)
Chloride: 111 mmol/L (ref 98–111)
Creatinine, Ser: 0.77 mg/dL (ref 0.61–1.24)
GFR, Estimated: >60 mL/min (ref >60)
Glucose, Bld: 101 mg/dL — ABNORMAL HIGH (ref 70–99)
Potassium: 3.9 mmol/L (ref 3.5–5.1)
Sodium: 135 mmol/L (ref 135–145)
Total Bilirubin: 16.3 mg/dL — ABNORMAL HIGH (ref 0.0–1.2)
Total Protein: 5.1 g/dL — ABNORMAL LOW (ref 6.5–8.1)

## 2023-08-24 LAB — PREPARE FRESH FROZEN PLASMA

## 2023-08-24 LAB — BPAM FFP
Blood Product Expiration Date: 202505112359
Blood Product Expiration Date: 202505112359
ISSUE DATE / TIME: 202505061053
ISSUE DATE / TIME: 202505061225
Unit Type and Rh: 5100
Unit Type and Rh: 5100

## 2023-08-24 LAB — PREPARE RBC (CROSSMATCH)

## 2023-08-24 NOTE — Progress Notes (Signed)
 [facility]  PROGRESS NOTE  Kevin Velez ZOX:[id number] DOB: October 13, 1950 DOA: 08/23/2023 PCP: Kevin Velez   Brief HPI:     73 y.o. male with medical history significant of type 2 diabetes, hypertension, GERD, alcoholic liver cirrhosis/failure who was recently admitted and discharged to SNF from 08/08/2023 until 08/20/2023 due to lower GI bleed.  He is returning today from [facility] due to rectal bleeding that started this morning when the patient started passing large bloody clots along with melanotic stools.  He also has abdominal pain with distention due to ascites.    Assessment/Plan:    Bleeding duodenal ulcer -CTA GI bleed showed active high-volume upper GI bleeding from duodenal bulb region. - IR was consulted, patient underwent mesenteric angiogram, gastroduodenal artery coil embolization - Bleeding seems to have stopped - Continue pantoprazole  40 mg IV twice daily  Acute on chronic blood loss anemia - Secondary to above, hemoglobin is stable at 9.0 - Follow CBC in a.m.  Alcoholic cirrhosis of liver with ascites - MELD score 25 - Continue octreotide  infusion - Continue ceftriaxone  for SBP prophylaxis - Continue prednisone for alcoholic hepatitis    Active Problems:   Protein-calorie malnutrition, severe In the setting of liver failure. Prognosis is poor.     Hypokalemia Replete Magnesium  was supplemented.     Leukocytosis On on ceftriaxone  as above. Follow-up WBC in AM.     Medications     Chlorhexidine  Gluconate Cloth  6 each Topical Daily   pantoprazole  (PROTONIX ) IV  40 mg Intravenous Q12H     Data Reviewed:   CBG:  Recent Labs  Lab 08/18/23 1157 08/18/23 1604 08/19/23 0806 08/19/23 1144 08/19/23 1704  GLUCAP 120* 133* 103* 157* 108*    SpO2: 94 % O2 Flow Rate (L/min): 2 L/min    Vitals:   08/24/23 0210 08/24/23 0211 08/24/23 0411 08/24/23 0700  BP:   (!) 136/55 (!) 139/50  Pulse: 91 99 90 91  Resp: 15  (!) 23 18 (!) 22  Temp:   (!) 97.4 F (36.3 C)   TempSrc:   Oral   SpO2: 95% 97% 95% 94%  Weight:      Height:          Data Reviewed:  Basic Metabolic Panel: Recent Labs  Lab 08/19/23 0522 08/20/23 0338 08/23/23 0822 08/23/23 1500 08/23/23 2314 08/24/23 0609  NA 141 142 140 140  --  135  K 3.2* 3.6 3.4* 3.1* 3.7 3.9  CL 113* 113* 113* 112*  --  111  CO2 20* 19* 20* 18*  --  15*  GLUCOSE 115* 87 118* 112*  --  101*  BUN 35* 30* 34* 33*  --  28*  CREATININE 0.79 0.70 0.90 0.68  --  0.77  CALCIUM 9.3 9.8 9.5 9.2  --  8.8*  MG 1.8 1.8  --  1.8  --   --   PHOS  --   --   --  3.9  --   --     CBC: Recent Labs  Lab 08/18/23 1228 08/20/23 0338 08/23/23 0822 08/23/23 1500 08/23/23 1841 08/23/23 2314 08/24/23 0609  WBC 15.7* 16.1* 20.4*  --   --   --  13.2*  NEUTROABS 13.8* 14.0*  --   --   --   --   --   HGB 9.2* 9.5* 8.1* 8.4* 8.5* 7.2* 9.0*  HCT 28.2* 28.6* 24.9* 25.2* 25.0* 21.1* 26.1*  MCV 91.0 88.8 93.3  --   --   --  89.1  PLT 111* 119* 151  --   --   --  95*    LFT Recent Labs  Lab 08/19/23 0522 08/20/23 0338 08/23/23 0822 08/24/23 0609  AST 110* 115* 106* 133*  ALT 105* 113* 111* 112*  ALKPHOS 81 88 91 74  BILITOT 18.7* 20.0* 17.1* 16.3*  PROT 6.0* 6.1* 5.6* 5.1*  ALBUMIN  2.3* 2.4* 2.2* 2.0*     Antibiotics: Anti-infectives (From admission, onward)    Start     Dose/Rate Route Frequency Ordered Stop   08/24/23 0800  cefTRIAXone  (ROCEPHIN ) 2 g in sodium chloride  0.9 % 100 mL IVPB        2 g 200 mL/hr over 30 Minutes Intravenous Every 24 hours 08/23/23 1420     08/23/23 0915  cefTRIAXone  (ROCEPHIN ) 1 g in sodium chloride  0.9 % 100 mL IVPB        1 g 200 mL/hr over 30 Minutes Intravenous  Once 08/23/23 0909 08/23/23 1143        DVT prophylaxis: SCDs  Code Status: Full code  Family Communication: Discussed with patient's brother at bedside   CONSULTS    Subjective   Denies abdominal pain.  No nausea or vomiting.   Objective     Physical Examination:   General-appears in no acute distress Heart-S1-S2, regular, no murmur auscultated Lungs-clear to auscultation bilaterally, no wheezing or crackles auscultated Abdomen-soft, nontender, distended, no organomegaly Extremities-no edema in the lower extremities Neuro-alert, oriented x3, no focal deficit noted   Status is: Inpatient:             Kevin Velez   [facility] If 7PM-7AM, please contact night-coverage at [email or web address], Office  [phone]   08/24/2023, 8:02 AM  LOS: 1 day

## 2023-08-24 NOTE — Progress Notes (Signed)
 Referring Physician(s): Nanavati,A  Supervising Physician: Alyssa Jumper  Patient Status:  [facility] - In-pt  Chief Complaint: Recent bloody stools, duodenal ulcer with recent bleeding from proximal duodenum; status post coil embolization of the gastroduodenal artery on 5/6    Subjective: Patient doing fairly well today.  Denies worsening abdominal pain, nausea, vomiting or any further bloody stools.   Allergies: Patient has no known allergies.  Medications: Prior to Admission medications   Medication Sig Start Date End Date Taking? Authorizing Provider  ascorbic acid (VITAMIN C) 500 MG tablet Take 500 mg by mouth daily.   Yes [provider]  cyanocobalamin (VITAMIN B12) 1000 MCG tablet Take 1,000 mcg by mouth daily.   Yes [provider]  folic acid  (FOLVITE ) 1 MG tablet Take 1 tablet (1 mg total) by mouth daily. 08/21/23  Yes Audria Leather, MD  lactulose  (CHRONULAC ) 10 GM/15ML solution Take 30 mLs (20 g total) by mouth 2 (two) times daily. 08/20/23  Yes Audria Leather, MD  midodrine  (PROAMATINE ) 10 MG tablet Take 1 tablet (10 mg total) by mouth 3 (three) times daily with meals. 08/20/23  Yes Audria Leather, MD  Multiple Vitamin (MULTIVITAMIN) tablet Take 1 tablet by mouth daily with breakfast.   Yes [provider]  pantoprazole  (PROTONIX ) 40 MG tablet Take 1 tablet (40 mg total) by mouth 2 (two) times daily before a meal. 08/20/23 10/19/23 Yes Audria Leather, MD  polyethylene glycol (MIRALAX  / GLYCOLAX ) 17 g packet Take 17 g by mouth daily as needed for moderate constipation. 08/20/23  Yes Audria Leather, MD  prednisoLONE  5 MG TABS tablet Take 8 tablets (40 mg total) by mouth daily for 16 days. 08/21/23 09/06/23 Yes Audria Leather, MD  sucralfate  (CARAFATE ) 1 GM/10ML suspension Take 10 mLs (1 g total) by mouth 4 (four) times daily -  with meals and at bedtime. 08/20/23  Yes Audria Leather, MD  thiamine  (VITAMIN B-1) 100 MG tablet Take 1 tablet (100 mg total) by mouth  daily. 08/21/23  Yes Audria Leather, MD  Multiple Vitamin (MULTIVITAMIN WITH MINERALS) TABS tablet Take 1 tablet by mouth daily. Patient not taking: Reported on 08/23/2023 08/21/23   Audria Leather, MD     Vital Signs: BP (!) 157/72   Pulse 92   Temp 97.6 F (36.4 C) (Oral)   Resp 14   Ht 5\' 7"  (1.702 m)   Wt 137 lb 5.6 oz (62.3 kg)   SpO2 94%   BMI 21.51 kg/m   Physical Exam: Awake, answering questions okay.  Affect fairly flat.  Abdomen soft, positive bowel sounds, nontender.  Puncture site right common femoral artery soft, clean, dry, no hematoma, not significantly tender.  Imaging: IR EMBO ART  VEN HEMORR LYMPH EXTRAV  INC GUIDE ROADMAPPING Result Date: 08/23/2023 INDICATION: 73 year old male with history of acute gastrointestinal hemorrhage and history of duodenal ulcer with CTA evidence of active extravasation from the proximal duodenum. EXAM: 1. Ultrasound-guided vascular access of the right common femoral artery. 2. Selective catheterization and angiography of the celiac artery, gastroduodenal artery, right hepatic artery, and superior mesenteric artery. 3. Coil embolization of the gastroduodenal artery. MEDICATIONS: None. ANESTHESIA/SEDATION: Moderate (conscious) sedation was employed during this procedure. A total of Versed 2 mg and Fentanyl  100 mcg was administered intravenously. Moderate Sedation Time: 56 minutes. The patient's level of consciousness and vital signs were monitored continuously by radiology nursing throughout the procedure under my direct supervision. CONTRAST:  84mL OMNIPAQUE  IOHEXOL  300 MG/ML SOLN, 25mL OMNIPAQUE  IOHEXOL  350 MG/ML SOLN FLUOROSCOPY:  Radiation Exposure Index (as provided by the fluoroscopic device): 1,742 mGy Kerma COMPLICATIONS: None immediate. PROCEDURE: Informed consent was obtained from the patient following explanation of the procedure, risks, benefits and alternatives. The patient understands, agrees and consents for the procedure. All questions were  addressed. A time out was performed prior to the initiation of the procedure. Maximal barrier sterile technique utilized including caps, mask, sterile gowns, sterile gloves, large sterile drape, hand hygiene, and Betadine prep. Preprocedure ultrasound evaluation of the right common femoral artery demonstrated patency. The procedure was planned. Subdermal Local anesthesia was administered at the planned needle entry site with 1% lidocaine . A small skin nick was made. Under direct ultrasound visualization, a 21 gauge micropuncture needle was introduced in the right common femoral artery. A permanent ultrasound image was captured and stored in the record. A micropuncture sheath was introduced. Limited right lower extremity angiogram was performed which demonstrated adequate puncture site for closure device use. A Bentson wire was introduced and the micropuncture sheath was exchanged for a 5 [place] vascular sheath. A C2 catheter was advanced over the Bentson wire and directed to the level of the celiac trunk. Celiac angiogram was performed which demonstrated conventional anatomy and patency. Using a 2.4 [place] Progreat microcatheter and fathom 16 microwire the gastroduodenal artery was selected. The catheter was able to be advanced into the pancreatic duodenal arcade and gastroepiploic artery. Coil embolization was then performed about the distal gastroduodenal artery with multiple detachable low profile Ruby coils. These coils were seen to migrate into the gastroepiploic artery. The catheter was retracted further an additional Ruby microcoils were deployed to the level of the ostium of the gastroduodenal artery. Attempts were made at catheterization of the cystic artery which were unsuccessful. Angiogram was performed from the base celiac catheter which demonstrated complete embolization of the gastroduodenal artery without evidence of hemorrhage in the region of the proximal duodenum. The catheter was repositioned to  the superior mesenteric artery. Angiogram was performed which demonstrated patency of the main superior mesenteric artery and its proximal branches. No evidence of active extravasation of the region of the duodenum. The catheter was removed. The sheath was exchanged for a 6 French Angio-Seal device which was deployed without complication. Peripheral pulses were unchanged. A sterile bandage was applied. The patient tolerated the procedure well was transferred back to the emergency room in good condition. IMPRESSION: Technically successful coil embolization of the gastroduodenal artery. No active extravasation was seen on angiography. Creasie Doctor, MD [facility] Radiology Electronically Signed   By: Creasie Doctor M.D.   On: 08/23/2023 16:14   IR Angiogram Visceral Selective Result Date: 08/23/2023 INDICATION: 73 year old male with history of acute gastrointestinal hemorrhage and history of duodenal ulcer with CTA evidence of active extravasation from the proximal duodenum. EXAM: 1. Ultrasound-guided vascular access of the right common femoral artery. 2. Selective catheterization and angiography of the celiac artery, gastroduodenal artery, right hepatic artery, and superior mesenteric artery. 3. Coil embolization of the gastroduodenal artery. MEDICATIONS: None. ANESTHESIA/SEDATION: Moderate (conscious) sedation was employed during this procedure. A total of Versed 2 mg and Fentanyl  100 mcg was administered intravenously. Moderate Sedation Time: 56 minutes. The patient's level of consciousness and vital signs were monitored continuously by radiology nursing throughout the procedure under my direct supervision. CONTRAST:  84mL OMNIPAQUE  IOHEXOL  300 MG/ML SOLN, 25mL OMNIPAQUE  IOHEXOL  350 MG/ML SOLN FLUOROSCOPY: Radiation Exposure Index (as provided by the fluoroscopic device): 1,742 mGy Kerma COMPLICATIONS: None immediate. PROCEDURE: Informed consent was obtained from  the patient following explanation of the procedure, risks, benefits and alternatives. The patient understands, agrees and consents for the procedure. All questions were addressed. A time out was performed prior to the initiation of the procedure. Maximal barrier sterile technique utilized including caps, mask, sterile gowns, sterile gloves, large sterile drape, hand hygiene, and Betadine prep. Preprocedure ultrasound evaluation of the right common femoral artery demonstrated patency. The procedure was planned. Subdermal Local anesthesia was administered at the planned needle entry site with 1% lidocaine . A small skin nick was made. Under direct ultrasound visualization, a 21 gauge micropuncture needle was introduced in the right common femoral artery. A permanent ultrasound image was captured and stored in the record. A micropuncture sheath was introduced. Limited right lower extremity angiogram was performed which demonstrated adequate puncture site for closure device use. A Bentson wire was introduced and the micropuncture sheath was exchanged for a 5 [place] vascular sheath. A C2 catheter was advanced over the Bentson wire and directed to the level of the celiac trunk. Celiac angiogram was performed which demonstrated conventional anatomy and patency. Using a 2.4 [place] Progreat microcatheter and fathom 16 microwire the gastroduodenal artery was selected. The catheter was able to be advanced into the pancreatic duodenal arcade and gastroepiploic artery. Coil embolization was then performed about the distal gastroduodenal artery with multiple detachable low profile Ruby coils. These coils were seen to migrate into the gastroepiploic artery. The catheter was retracted further an additional Ruby microcoils were deployed to the level of the ostium of the gastroduodenal artery. Attempts were made at catheterization of the cystic artery which were unsuccessful. Angiogram was performed from the base celiac catheter which  demonstrated complete embolization of the gastroduodenal artery without evidence of hemorrhage in the region of the proximal duodenum. The catheter was repositioned to the superior mesenteric artery. Angiogram was performed which demonstrated patency of the main superior mesenteric artery and its proximal branches. No evidence of active extravasation of the region of the duodenum. The catheter was removed. The sheath was exchanged for a 6 French Angio-Seal device which was deployed without complication. Peripheral pulses were unchanged. A sterile bandage was applied. The patient tolerated the procedure well was transferred back to the emergency room in good condition. IMPRESSION: Technically successful coil embolization of the gastroduodenal artery. No active extravasation was seen on angiography. Creasie Doctor, MD [facility] Radiology Electronically Signed   By: Creasie Doctor M.D.   On: 08/23/2023 16:14   IR US  Guide Vasc Access Right Result Date: 08/23/2023 INDICATION: 73 year old male with history of acute gastrointestinal hemorrhage and history of duodenal ulcer with CTA evidence of active extravasation from the proximal duodenum. EXAM: 1. Ultrasound-guided vascular access of the right common femoral artery. 2. Selective catheterization and angiography of the celiac artery, gastroduodenal artery, right hepatic artery, and superior mesenteric artery. 3. Coil embolization of the gastroduodenal artery. MEDICATIONS: None. ANESTHESIA/SEDATION: Moderate (conscious) sedation was employed during this procedure. A total of Versed 2 mg and Fentanyl  100 mcg was administered intravenously. Moderate Sedation Time: 56 minutes. The patient's level of consciousness and vital signs were monitored continuously by radiology nursing throughout the procedure under my direct supervision. CONTRAST:  84mL OMNIPAQUE  IOHEXOL  300 MG/ML SOLN, 25mL OMNIPAQUE  IOHEXOL  350 MG/ML SOLN  FLUOROSCOPY: Radiation Exposure Index (as provided by the fluoroscopic device): 1,742 mGy Kerma COMPLICATIONS: None immediate. PROCEDURE: Informed consent was obtained from the patient following explanation of the procedure, risks, benefits and alternatives. The patient understands, agrees and consents for the  procedure. All questions were addressed. A time out was performed prior to the initiation of the procedure. Maximal barrier sterile technique utilized including caps, mask, sterile gowns, sterile gloves, large sterile drape, hand hygiene, and Betadine prep. Preprocedure ultrasound evaluation of the right common femoral artery demonstrated patency. The procedure was planned. Subdermal Local anesthesia was administered at the planned needle entry site with 1% lidocaine . A small skin nick was made. Under direct ultrasound visualization, a 21 gauge micropuncture needle was introduced in the right common femoral artery. A permanent ultrasound image was captured and stored in the record. A micropuncture sheath was introduced. Limited right lower extremity angiogram was performed which demonstrated adequate puncture site for closure device use. A Bentson wire was introduced and the micropuncture sheath was exchanged for a 5 [place] vascular sheath. A C2 catheter was advanced over the Bentson wire and directed to the level of the celiac trunk. Celiac angiogram was performed which demonstrated conventional anatomy and patency. Using a 2.4 [place] Progreat microcatheter and fathom 16 microwire the gastroduodenal artery was selected. The catheter was able to be advanced into the pancreatic duodenal arcade and gastroepiploic artery. Coil embolization was then performed about the distal gastroduodenal artery with multiple detachable low profile Ruby coils. These coils were seen to migrate into the gastroepiploic artery. The catheter was retracted further an additional Ruby microcoils were deployed to the level of the ostium  of the gastroduodenal artery. Attempts were made at catheterization of the cystic artery which were unsuccessful. Angiogram was performed from the base celiac catheter which demonstrated complete embolization of the gastroduodenal artery without evidence of hemorrhage in the region of the proximal duodenum. The catheter was repositioned to the superior mesenteric artery. Angiogram was performed which demonstrated patency of the main superior mesenteric artery and its proximal branches. No evidence of active extravasation of the region of the duodenum. The catheter was removed. The sheath was exchanged for a 6 French Angio-Seal device which was deployed without complication. Peripheral pulses were unchanged. A sterile bandage was applied. The patient tolerated the procedure well was transferred back to the emergency room in good condition. IMPRESSION: Technically successful coil embolization of the gastroduodenal artery. No active extravasation was seen on angiography. Creasie Doctor, MD [facility] Radiology Electronically Signed   By: Creasie Doctor M.D.   On: 08/23/2023 16:14   CT ANGIO GI BLEED Result Date: 08/23/2023 CLINICAL DATA:  Duodenal ulcer melena, active bleeding. EXAM: CTA ABDOMEN AND PELVIS WITHOUT AND WITH CONTRAST TECHNIQUE: Multidetector CT imaging of the abdomen and pelvis was performed using the standard protocol during bolus administration of intravenous contrast. Multiplanar reconstructed images and MIPs were obtained and reviewed to evaluate the vascular anatomy. RADIATION DOSE REDUCTION: This exam was performed according to the departmental dose-optimization program which includes automated exposure control, adjustment of the mA and/or kV according to patient size and/or use of iterative reconstruction technique. CONTRAST:  OMNIPAQUE  IOHEXOL  350 MG/ML SOLN COMPARISON:  CT angiography abdomen and pelvis from 08/15/2023. FINDINGS: VASCULAR  Aorta: Normal caliber aorta without aneurysm, dissection, vasculitis or significant stenosis. Celiac: Patent without evidence of aneurysm, dissection, vasculitis or significant stenosis. There is a small branch arising from the common hepatic artery which extends up to the duodenal bulb region which is associated with active extravasation of contrast with contras blush measuring up to 1.1 x 1.9 cm on arterial phase images, which slightly increases in size on the portal venous phase images and markedly increases on the delayed images  with extension up to the duodeno-jejunal junction region, compatible with source of high volume active GI bleeding. SMA: Patent without evidence of aneurysm, dissection, vasculitis or significant stenosis. Renals: Both renal arteries are patent without evidence of aneurysm, dissection, vasculitis, fibromuscular dysplasia or significant stenosis. IMA: Patent without evidence of aneurysm, dissection, vasculitis or significant stenosis. Inflow: Patent without evidence of aneurysm, dissection, vasculitis or significant stenosis. Proximal Outflow: Bilateral common femoral and visualized portions of the superficial and profunda femoral arteries are patent without evidence of aneurysm, dissection, vasculitis or significant stenosis. Veins: No obvious venous abnormality within the limitations of this arterial phase study. Review of the MIP images confirms the above findings. NON-VASCULAR Lower chest: There is small left pleural effusion with associated compressive changes at the left lung base. There is trace right pleural effusion. Bilateral lung bases are otherwise clear. Normal cardiac size. No pericardial effusion. There is apparent hypoattenuation of the blood pool relative to the myocardium, suggestive of anemia. Hepatobiliary: The liver is normal in size. There is liver surface irregularity/nodularity, compatible with cirrhotic configuration. No intrahepatic or extrahepatic bile duct  dilation. There are layering hyperattenuating areas in the gallbladder, likely vicarious excretion of previously administered intravenous contrast. No abnormal wall thickening or pericholecystic fat stranding. No imaging evidence of acute cholecystitis. Pancreas: Unremarkable. No pancreatic ductal dilatation or surrounding inflammatory changes. Spleen: Within normal limits. No focal lesion. Adrenals/Urinary Tract: Adrenal glands are unremarkable. No suspicious renal mass. There are several simple cysts throughout bilateral kidneys with largest measuring up to 2.3 x 3.3 cm arising from the right kidney interpolar region, laterally. No hydroureteronephrosis or nephroureterolithiasis. Unremarkable urinary bladder. Stomach/Bowel: No disproportionate dilation of the small or large bowel loops. No evidence of abnormal bowel wall thickening or inflammatory changes. The appendix is unremarkable. There are multiple diverticula throughout the colon, without imaging signs of diverticulitis. Vascular/Lymphatic: There is moderate to large ascites. No pneumoperitoneum. No abdominal or pelvic lymphadenopathy, by size criteria. No aneurysmal dilation of the major abdominal arteries. There are mild peripheral atherosclerotic vascular calcifications of the aorta and its major branches. Reproductive: Normal size prostate. Symmetric seminal vesicles. Other: Moderate anasarca. The soft tissues and abdominal wall are otherwise unremarkable. Musculoskeletal: No suspicious osseous lesions. There are mild - moderate multilevel degenerative changes in the visualized spine. IMPRESSION: 1. Active high volume upper GI bleeding from duodenal bulb region, as described above. Emergent GI or interventional radiology consultation is recommended. 2. Small left pleural effusion and trace right pleural effusion. 3. Cirrhotic liver configuration and moderate-to-large ascites. 4. Multiple other nonacute observations, as described above. Aortic  Atherosclerosis (ICD10-I70.0). Critical Value/emergent results were called by telephone at the time of interpretation on 08/23/2023 at 10:48 am to provider [facility] , who verbally acknowledged these results. Electronically Signed   By: Beula Brunswick M.D.   On: 08/23/2023 10:49    Labs:  CBC: Recent Labs    08/18/23 1228 08/20/23 0338 08/23/23 0822 08/23/23 1500 08/23/23 1841 08/23/23 2314 08/24/23 0609  WBC 15.7* 16.1* 20.4*  --   --   --  13.2*  HGB 9.2* 9.5* 8.1* 8.4* 8.5* 7.2* 9.0*  HCT 28.2* 28.6* 24.9* 25.2* 25.0* 21.1* 26.1*  PLT 111* 119* 151  --   --   --  95*    COAGS: Recent Labs    08/15/23 0519 08/16/23 0340 08/17/23 0744 08/23/23 0823  INR 2.4* 1.9* 1.8* 2.0*    BMP: Recent Labs    08/20/23 0338 08/23/23 5784 08/23/23 1500 08/23/23 2314 08/24/23 6962  NA 142 140 140  --  135  K 3.6 3.4* 3.1* 3.7 3.9  CL 113* 113* 112*  --  111  CO2 19* 20* 18*  --  15*  GLUCOSE 87 118* 112*  --  101*  BUN 30* 34* 33*  --  28*  CALCIUM 9.8 9.5 9.2  --  8.8*  CREATININE 0.70 0.90 0.68  --  0.77  GFRNONAA >60 >60 >60  --  >60    LIVER FUNCTION TESTS: Recent Labs    08/19/23 0522 08/20/23 0338 08/23/23 0822 08/24/23 0609  BILITOT 18.7* 20.0* 17.1* 16.3*  AST 110* 115* 106* 133*  ALT 105* 113* 111* 112*  ALKPHOS 81 88 91 74  PROT 6.0* 6.1* 5.6* 5.1*  ALBUMIN  2.3* 2.4* 2.2* 2.0*    Assessment and Plan: 73 year old male with history of acute gastrointestinal hemorrhage and history of duodenal ulcer with CTA evidence of active extravasation from proximal duodenum , status post gastroduodenal artery coil embolization yesterday; stable, hemoglobin 9, up from 7.2, creatinine normal; continue with lab checks, gentle hydration ; further plans per GI/TRH   Electronically Signed: D. Honore Lux, PA-C 08/24/2023, 1:09 PM   I spent a total of 15 Minutes at the the patient's bedside AND on the patient's hospital floor or unit, greater than 50% of which was  counseling/coordinating care for visceral arteriogram with embolization    Patient ID: Kevin Velez, male   DOB: February 24, 1951, 73 y.o.   MRN: [id number]

## 2023-08-24 NOTE — TOC Initial Note (Signed)
 Transition of Care [facility]) - Initial/Assessment Note    Patient Details  Name: Kevin Velez MRN: [id number] Date of Birth: [birth year]  [facility]) CM/SW Contact:    Amaryllis Junior, LCSW Phone Number: 08/24/2023, 11:47 AM  Clinical Narrative:                 Pt readmitted to hospital from [facility] for STR. Pt continues medical workup to decide appropriate dc plan. TOC following   Expected Discharge Plan: Skilled Nursing Facility Barriers to Discharge: Continued Medical Work up   Patient Goals and CMS Choice Patient states their goals for this hospitalization and ongoing recovery are:: return to SNF to complete rehab CMS Medicare.gov Compare Post Acute Care list provided to::  (NA) Choice offered to / list presented to : NA [place] ownership interest in [facility].provided to::  (NA)    Expected Discharge Plan and Services In-house Referral: NA   Post Acute Care Choice: Skilled Nursing Facility Living arrangements for the past 2 months: Skilled Nursing Facility [name])                 DME Arranged: N/A DME Agency: NA       HH Arranged: NA HH Agency: NA        Prior Living Arrangements/Services Living arrangements for the past 2 months: Skilled Nursing Facility Psychologist, counselling) Lives with:: Facility Resident Patient language and need for interpreter reviewed:: Yes Do you feel safe going back to the place where you live?: Yes      Need for Family Participation in Patient Care: Yes (Comment) Care giver support system in place?: Yes (comment)   Criminal Activity/Legal Involvement Pertinent to Current Situation/Hospitalization: No - Comment as needed  Activities of Daily Living   ADL Screening (condition at time of admission) Independently performs ADLs?: No Does the patient have a NEW difficulty with bathing/dressing/toileting/self-feeding that is expected to last >3 days?: Yes (Initiates electronic notice to provider for possible OT  consult) Does the patient have a NEW difficulty with getting in/out of bed, walking, or climbing stairs that is expected to last >3 days?: Yes (Initiates electronic notice to provider for possible PT consult) Does the patient have a NEW difficulty with communication that is expected to last >3 days?: Yes (Initiates electronic notice to provider for possible SLP consult) Is the patient deaf or have difficulty hearing?: No Does the patient have difficulty seeing, even when wearing glasses/contacts?: Yes Does the patient have difficulty concentrating, remembering, or making decisions?: Yes  Permission Sought/Granted                  Emotional Assessment Appearance:: Appears stated age     Orientation: : Oriented to Self, Oriented to Place, Oriented to  Time, Oriented to Situation Alcohol / Substance Use: Not Applicable Psych Involvement: No (comment)  Admission diagnosis:  Melena [K92.1] Acute lower GI bleeding [K92.2] Acute GI bleeding [K92.2] 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 coma (HCC) [K72.10] Patient Active Problem List   Diagnosis Date Noted   Acute lower GI bleeding 08/23/2023   Coagulopathy (HCC) 08/23/2023   Acute on chronic blood loss anemia 08/23/2023   Hypokalemia 08/23/2023   Alcoholic cirrhosis of liver with ascites (HCC) 08/23/2023   Leukocytosis 08/23/2023   Ulcer, gastric, acute 08/23/2023   Alcoholic hepatitis 08/23/2023   Protein-calorie malnutrition, severe 08/10/2023   Liver failure (HCC) 08/08/2023   PCP:  Patient, No Pcp Per Pharmacy:   [facility] - [place], [place] - [address] [place] [address] Phone: [phone] Fax: [phone]     Social Drivers of Health (SDOH) Social History: SDOH Screenings   Food Insecurity: No Food Insecurity (08/23/2023)  Housing: Low Risk  (08/23/2023)  Transportation Needs: No Transportation Needs (08/23/2023)  Utilities: Not At Risk (08/23/2023)  Social Connections:  Socially Isolated (08/23/2023)  Tobacco Use: High Risk (08/23/2023)   SDOH Interventions:     Readmission Risk Interventions    08/24/2023   10:39 AM 08/11/2023    2:18 PM  Readmission Risk Prevention Plan  Transportation Screening Complete Complete  PCP or Specialist Appt within 3-5 Days Complete   HRI or Home Care Consult Complete Complete  Social Work Consult for Recovery Care Planning/Counseling Complete Complete  Palliative Care Screening Not Applicable Not Applicable  Medication Review Oceanographer) Complete Complete

## 2023-08-24 NOTE — Progress Notes (Signed)
 Kevin Velez B Ozturk 1:46 PM  Subjective: Patient doing well without complaints and his case discussed with his nurse and he has no signs of bleeding and he is tolerating clear liquids even though he does not remember drinking them and he has no new complaints  Objective: Vital signs stable afebrile no acute distress abdomen is soft nontender probably some ascites BUN slight drop bili slight decrease hemoglobin increased nicely status posttransfusion  Assessment: Duodenal ulcer with bleed in patient with probably end-stage cirrhosis  Plan: Okay with me to wean octreotide  if no bleeding tomorrow okay with me to advance diet might consider a trial of lactulose  to see if his memory gets any better and I will check on tomorrow  [facility]  office [phone] After 5PM or if no answer call [phone]

## 2023-08-24 NOTE — Plan of Care (Signed)

## 2023-08-25 DIAGNOSIS — D62 Acute posthemorrhagic anemia: Secondary | ICD-10-CM | POA: Diagnosis not present

## 2023-08-25 DIAGNOSIS — K922 Gastrointestinal hemorrhage, unspecified: Secondary | ICD-10-CM | POA: Diagnosis not present

## 2023-08-25 DIAGNOSIS — K7031 Alcoholic cirrhosis of liver with ascites: Secondary | ICD-10-CM | POA: Diagnosis not present

## 2023-08-25 LAB — CBC
HCT: 21.1 % — ABNORMAL LOW (ref 39.0–52.0)
HCT: 18.9 % — ABNORMAL LOW (ref 39.0–52.0)
Hemoglobin: 7.3 g/dL — ABNORMAL LOW (ref 13.0–17.0)
Hemoglobin: 6.3 g/dL — CL (ref 13.0–17.0)
MCH: 31.3 pg (ref 26.0–34.0)
MCH: 30.6 pg (ref 26.0–34.0)
MCHC: 34.6 g/dL (ref 30.0–36.0)
MCHC: 33.3 g/dL (ref 30.0–36.0)
MCV: 90.6 fL (ref 80.0–100.0)
MCV: 91.7 fL (ref 80.0–100.0)
Platelets: 91 10*3/uL — ABNORMAL LOW (ref 150–400)
Platelets: 120 10*3/uL — ABNORMAL LOW (ref 150–400)
RBC: 2.33 MIL/uL — ABNORMAL LOW (ref 4.22–5.81)
RBC: 2.06 MIL/uL — ABNORMAL LOW (ref 4.22–5.81)
RDW: 19.5 % — ABNORMAL HIGH (ref 11.5–15.5)
RDW: 19.9 % — ABNORMAL HIGH (ref 11.5–15.5)
WBC: 12.9 10*3/uL — ABNORMAL HIGH (ref 4.0–10.5)
WBC: 14.5 10*3/uL — ABNORMAL HIGH (ref 4.0–10.5)
nRBC: 0 % (ref 0.0–0.2)
nRBC: 0 % (ref 0.0–0.2)

## 2023-08-25 LAB — PREPARE RBC (CROSSMATCH)

## 2023-08-25 MED ORDER — SODIUM CHLORIDE 0.9% IV SOLUTION: Freq: Once | INTRAVENOUS | Status: AC

## 2023-08-25 NOTE — Progress Notes (Signed)
 [facility]  PROGRESS NOTE  EITHEN PRIER UJW:[id number] DOB: 1951-02-15 DOA: 08/23/2023 PCP: Patient, No Pcp Per   Brief HPI:     73 y.o. male with medical history significant of type 2 diabetes, hypertension, GERD, alcoholic liver cirrhosis/failure who was recently admitted and discharged to SNF from 08/08/2023 until 08/20/2023 due to lower GI bleed.  He is returning today from [facility] nursing facility due to rectal bleeding that started this morning when the patient started passing large bloody clots along with melanotic stools.  He also has abdominal pain with distention due to ascites.    Assessment/Plan:    Bleeding duodenal ulcer -CTA GI bleed showed active high-volume upper GI bleeding from duodenal bulb region. - IR was consulted, patient underwent mesenteric angiogram, gastroduodenal artery coil embolization - Bleeding seems to have stopped - Continue pantoprazole  40 mg IV twice daily  Acute on chronic blood loss anemia - Secondary to above, hemoglobin is down to 7.3 -Transfuse for Hb less than 7 - Follow CBC in a.m.  Alcoholic cirrhosis of liver with ascites - MELD score 25 - Continue octreotide  infusion - Continue ceftriaxone  for SBP prophylaxis - Continue prednisone for alcoholic hepatitis    Active Problems:   Protein-calorie malnutrition, severe In the setting of liver failure. Prognosis is poor.     Hypokalemia Replete Magnesium  was supplemented.     Leukocytosis On  ceftriaxone  as above.      Medications     Chlorhexidine  Gluconate Cloth  6 each Topical Daily   pantoprazole  (PROTONIX ) IV  40 mg Intravenous Q12H     Data Reviewed:   CBG:  Recent Labs  Lab 08/18/23 1604 08/19/23 0806 08/19/23 1144 08/19/23 1704  GLUCAP 133* 103* 157* 108*    SpO2: 93 % O2 Flow Rate (L/min): 2 L/min    Vitals:   08/25/23 0900 08/25/23 1016 08/25/23 1100 08/25/23 1139  BP: (!) 97/55 (!) 102/50 (!) 110/48   Pulse: 96 98 95 (!) 101  Resp: 14  13 12  (!) 21  Temp:      TempSrc:      SpO2: 95% 95% 95% 93%  Weight:      Height:          Data Reviewed:  Basic Metabolic Panel: Recent Labs  Lab 08/19/23 0522 08/20/23 0338 08/23/23 0822 08/23/23 1500 08/23/23 2314 08/24/23 0609  NA 141 142 140 140  --  135  K 3.2* 3.6 3.4* 3.1* 3.7 3.9  CL 113* 113* 113* 112*  --  111  CO2 20* 19* 20* 18*  --  15*  GLUCOSE 115* 87 118* 112*  --  101*  BUN 35* 30* 34* 33*  --  28*  CREATININE 0.79 0.70 0.90 0.68  --  0.77  CALCIUM 9.3 9.8 9.5 9.2  --  8.8*  MG 1.8 1.8  --  1.8  --   --   PHOS  --   --   --  3.9  --   --     CBC: Recent Labs  Lab 08/20/23 0338 08/23/23 0822 08/23/23 1500 08/23/23 1841 08/23/23 2314 08/24/23 0609 08/25/23 0802  WBC 16.1* 20.4*  --   --   --  13.2* 12.9*  NEUTROABS 14.0*  --   --   --   --   --   --   HGB 9.5* 8.1* 8.4* 8.5* 7.2* 9.0* 7.3*  HCT 28.6* 24.9* 25.2* 25.0* 21.1* 26.1* 21.1*  MCV 88.8 93.3  --   --   --  89.1 90.6  PLT 119* 151  --   --   --  95* 91*    LFT Recent Labs  Lab 08/19/23 0522 08/20/23 0338 08/23/23 0822 08/24/23 0609  AST 110* 115* 106* 133*  ALT 105* 113* 111* 112*  ALKPHOS 81 88 91 74  BILITOT 18.7* 20.0* 17.1* 16.3*  PROT 6.0* 6.1* 5.6* 5.1*  ALBUMIN  2.3* 2.4* 2.2* 2.0*     Antibiotics: Anti-infectives (From admission, onward)    Start     Dose/Rate Route Frequency Ordered Stop   08/24/23 0800  cefTRIAXone  (ROCEPHIN ) 2 g in sodium chloride  0.9 % 100 mL IVPB        2 g 200 mL/hr over 30 Minutes Intravenous Every 24 hours 08/23/23 1420     08/23/23 0915  cefTRIAXone  (ROCEPHIN ) 1 g in sodium chloride  0.9 % 100 mL IVPB        1 g 200 mL/hr over 30 Minutes Intravenous  Once 08/23/23 0909 08/23/23 1143        DVT prophylaxis: SCDs  Code Status: Full code  Family Communication: Discussed with patient's brother at bedside   CONSULTS    Subjective   Had black stool this morning.   Objective    Physical Examination:  Appears in no  acute distress S1-S2, regular, no murmur auscultated Abdomen is soft, nontender, distended, no organomegaly    Status is: Inpatient:             [name]   [facility] If 7PM-7AM, please contact night-coverage at [email or web address], Office  [phone]   08/25/2023, 12:53 PM  LOS: 2 days

## 2023-08-25 NOTE — Plan of Care (Signed)
   Problem: Education: Goal: Knowledge of General Education information will improve Description: Including pain rating scale, medication(s)/side effects and non-pharmacologic comfort measures Outcome: Progressing   Problem: Health Behavior/Discharge Planning: Goal: Ability to manage health-related needs will improve Outcome: Progressing   Problem: Clinical Measurements: Goal: Will remain free from infection Outcome: Progressing

## 2023-08-25 NOTE — Progress Notes (Signed)
 Todd Fossa B Richison 1:30 PM  Subjective: Patient with 1 black stool this morning and case discussed with his nurse and his brother he has no complaints  Objective: Vital signs stable afebrile no acute distress abdomen is soft nontender some ascites BUN decreased hemoglobin drop which can happen after transfusion in cirrhotics but some decreases hydration normal as well  Assessment: Duodenal ulcer bleed status post coils by IR in patient with cirrhosis  Plan: Will keep on clears for 1 more day and consider repeat endoscopy as needed  [facility] E  office [phone] After 5PM or if no answer call [phone]

## 2023-08-26 ENCOUNTER — Inpatient Hospital Stay (HOSPITAL_COMMUNITY)

## 2023-08-26 ENCOUNTER — Encounter (HOSPITAL_COMMUNITY): Admission: EM | Disposition: E | Payer: Self-pay | Source: Skilled Nursing Facility | Attending: Critical Care Medicine

## 2023-08-26 ENCOUNTER — Encounter (HOSPITAL_COMMUNITY): Payer: Self-pay | Admitting: Internal Medicine

## 2023-08-26 DIAGNOSIS — R578 Other shock: Secondary | ICD-10-CM

## 2023-08-26 DIAGNOSIS — R579 Shock, unspecified: Secondary | ICD-10-CM | POA: Diagnosis not present

## 2023-08-26 DIAGNOSIS — K7030 Alcoholic cirrhosis of liver without ascites: Secondary | ICD-10-CM

## 2023-08-26 DIAGNOSIS — R571 Hypovolemic shock: Secondary | ICD-10-CM

## 2023-08-26 DIAGNOSIS — D696 Thrombocytopenia, unspecified: Secondary | ICD-10-CM

## 2023-08-26 DIAGNOSIS — K922 Gastrointestinal hemorrhage, unspecified: Secondary | ICD-10-CM | POA: Diagnosis not present

## 2023-08-26 HISTORY — PX: ESOPHAGOGASTRODUODENOSCOPY: SHX5428

## 2023-08-26 LAB — CBC
HCT: 26.3 % — ABNORMAL LOW (ref 39.0–52.0)
HCT: 14.5 % — ABNORMAL LOW (ref 39.0–52.0)
HCT: 29 % — ABNORMAL LOW (ref 39.0–52.0)
Hemoglobin: 9.3 g/dL — ABNORMAL LOW (ref 13.0–17.0)
Hemoglobin: 4.9 g/dL — CL (ref 13.0–17.0)
Hemoglobin: 10 g/dL — ABNORMAL LOW (ref 13.0–17.0)
MCH: 30.7 pg (ref 26.0–34.0)
MCH: 30.4 pg (ref 26.0–34.0)
MCH: 30.2 pg (ref 26.0–34.0)
MCHC: 35.4 g/dL (ref 30.0–36.0)
MCHC: 33.8 g/dL (ref 30.0–36.0)
MCHC: 34.5 g/dL (ref 30.0–36.0)
MCV: 86.8 fL (ref 80.0–100.0)
MCV: 90.1 fL (ref 80.0–100.0)
MCV: 87.6 fL (ref 80.0–100.0)
Platelets: 102 10*3/uL — ABNORMAL LOW (ref 150–400)
Platelets: 74 10*3/uL — ABNORMAL LOW (ref 150–400)
Platelets: 116 10*3/uL — ABNORMAL LOW (ref 150–400)
RBC: 3.03 MIL/uL — ABNORMAL LOW (ref 4.22–5.81)
RBC: 1.61 MIL/uL — ABNORMAL LOW (ref 4.22–5.81)
RBC: 3.31 MIL/uL — ABNORMAL LOW (ref 4.22–5.81)
RDW: 16.2 % — ABNORMAL HIGH (ref 11.5–15.5)
RDW: 16.6 % — ABNORMAL HIGH (ref 11.5–15.5)
RDW: 15.2 % (ref 11.5–15.5)
WBC: 14.4 10*3/uL — ABNORMAL HIGH (ref 4.0–10.5)
WBC: 10.7 10*3/uL — ABNORMAL HIGH (ref 4.0–10.5)
WBC: 15.9 10*3/uL — ABNORMAL HIGH (ref 4.0–10.5)
nRBC: 0 % (ref 0.0–0.2)
nRBC: 0 % (ref 0.0–0.2)
nRBC: 0.2 % (ref 0.0–0.2)

## 2023-08-26 LAB — BLOOD GAS, ARTERIAL
Acid-base deficit: 7.2 mmol/L — ABNORMAL HIGH (ref 0.0–2.0)
Acid-base deficit: 13.2 mmol/L — ABNORMAL HIGH (ref 0.0–2.0)
Bicarbonate: 15.3 mmol/L — ABNORMAL LOW (ref 20.0–28.0)
Bicarbonate: 11.7 mmol/L — ABNORMAL LOW (ref 20.0–28.0)
Drawn by: 25770
Drawn by: 31394
FIO2: 100 %
FIO2: 100 %
MECHVT: 520 mL
MECHVT: 520 mL
O2 Saturation: 100 %
O2 Saturation: 100 %
PEEP: 5 cmH2O
PEEP: 5 cmH2O
Patient temperature: 36.3
Patient temperature: 35.4
RATE: 26 {breaths}/min
RATE: 18 {breaths}/min
pCO2 arterial: 20 mmHg — ABNORMAL LOW (ref 32–48)
pCO2 arterial: 23 mmHg — ABNORMAL LOW (ref 32–48)
pH, Arterial: 7.48 — ABNORMAL HIGH (ref 7.35–7.45)
pH, Arterial: 7.3 — ABNORMAL LOW (ref 7.35–7.45)
pO2, Arterial: 301 mmHg — ABNORMAL HIGH (ref 83–108)
pO2, Arterial: 325 mmHg — ABNORMAL HIGH (ref 83–108)

## 2023-08-26 LAB — CBC WITH DIFFERENTIAL/PLATELET
Abs Immature Granulocytes: 0.1 10*3/uL — ABNORMAL HIGH (ref 0.00–0.07)
Basophils Absolute: 0 10*3/uL (ref 0.0–0.1)
Basophils Relative: 0 %
Eosinophils Absolute: 0.1 10*3/uL (ref 0.0–0.5)
Eosinophils Relative: 1 %
HCT: 27.9 % — ABNORMAL LOW (ref 39.0–52.0)
Hemoglobin: 9.8 g/dL — ABNORMAL LOW (ref 13.0–17.0)
Immature Granulocytes: 1 %
Lymphocytes Relative: 13 %
Lymphs Abs: 1.9 10*3/uL (ref 0.7–4.0)
MCH: 30.7 pg (ref 26.0–34.0)
MCHC: 35.1 g/dL (ref 30.0–36.0)
MCV: 87.5 fL (ref 80.0–100.0)
Monocytes Absolute: 0.8 10*3/uL (ref 0.1–1.0)
Monocytes Relative: 5 %
Neutro Abs: 11.8 10*3/uL — ABNORMAL HIGH (ref 1.7–7.7)
Neutrophils Relative %: 80 %
Platelets: 111 10*3/uL — ABNORMAL LOW (ref 150–400)
RBC: 3.19 MIL/uL — ABNORMAL LOW (ref 4.22–5.81)
RDW: 16.1 % — ABNORMAL HIGH (ref 11.5–15.5)
WBC: 14.6 10*3/uL — ABNORMAL HIGH (ref 4.0–10.5)
nRBC: 0 % (ref 0.0–0.2)

## 2023-08-26 LAB — MAGNESIUM: Magnesium: 1.7 mg/dL (ref 1.7–2.4)

## 2023-08-26 LAB — PREPARE RBC (CROSSMATCH)

## 2023-08-26 LAB — BLOOD GAS, VENOUS
Acid-base deficit: 7.6 mmol/L — ABNORMAL HIGH (ref 0.0–2.0)
Bicarbonate: 16.9 mmol/L — ABNORMAL LOW (ref 20.0–28.0)
O2 Saturation: 67.8 %
Patient temperature: 37
pCO2, Ven: 30 mmHg — ABNORMAL LOW (ref 44–60)
pH, Ven: 7.36 (ref 7.25–7.43)
pO2, Ven: 36 mmHg (ref 32–45)

## 2023-08-26 LAB — GLUCOSE, CAPILLARY
Glucose-Capillary: 104 mg/dL — ABNORMAL HIGH (ref 70–99)
Glucose-Capillary: 237 mg/dL — ABNORMAL HIGH (ref 70–99)
Glucose-Capillary: 244 mg/dL — ABNORMAL HIGH (ref 70–99)

## 2023-08-26 LAB — COMPREHENSIVE METABOLIC PANEL WITH GFR
ALT: 100 U/L — ABNORMAL HIGH (ref 0–44)
AST: 130 U/L — ABNORMAL HIGH (ref 15–41)
Albumin: 1.7 g/dL — ABNORMAL LOW (ref 3.5–5.0)
Alkaline Phosphatase: 60 U/L (ref 38–126)
Anion gap: 12 (ref 5–15)
BUN: 41 mg/dL — ABNORMAL HIGH (ref 8–23)
CO2: 15 mmol/L — ABNORMAL LOW (ref 22–32)
Calcium: 8.4 mg/dL — ABNORMAL LOW (ref 8.9–10.3)
Chloride: 115 mmol/L — ABNORMAL HIGH (ref 98–111)
Creatinine, Ser: 1.13 mg/dL (ref 0.61–1.24)
GFR, Estimated: >60 mL/min (ref >60)
Glucose, Bld: 120 mg/dL — ABNORMAL HIGH (ref 70–99)
Potassium: 4 mmol/L (ref 3.5–5.1)
Sodium: 142 mmol/L (ref 135–145)
Total Bilirubin: 17.8 mg/dL — ABNORMAL HIGH (ref 0.0–1.2)
Total Protein: 4.2 g/dL — ABNORMAL LOW (ref 6.5–8.1)

## 2023-08-26 LAB — PROTIME-INR
INR: 2.3 — ABNORMAL HIGH (ref 0.8–1.2)
INR: 2.9 — ABNORMAL HIGH (ref 0.8–1.2)
Prothrombin Time: 25.6 s — ABNORMAL HIGH (ref 11.4–15.2)
Prothrombin Time: 30.9 s — ABNORMAL HIGH (ref 11.4–15.2)

## 2023-08-26 LAB — LACTIC ACID, PLASMA: Lactic Acid, Venous: 2 mmol/L (ref 0.5–1.9)

## 2023-08-26 LAB — PREPARE PLATELET PHERESIS: Unit division: 0

## 2023-08-26 LAB — HEMOGLOBIN AND HEMATOCRIT, BLOOD
HCT: 21 % — ABNORMAL LOW (ref 39.0–52.0)
HCT: 25.5 % — ABNORMAL LOW (ref 39.0–52.0)
HCT: 31.6 % — ABNORMAL LOW (ref 39.0–52.0)
Hemoglobin: 7.1 g/dL — ABNORMAL LOW (ref 13.0–17.0)
Hemoglobin: 8.7 g/dL — ABNORMAL LOW (ref 13.0–17.0)
Hemoglobin: 10.8 g/dL — ABNORMAL LOW (ref 13.0–17.0)

## 2023-08-26 LAB — BPAM PLATELET PHERESIS
Blood Product Expiration Date: 202505112359
ISSUE DATE / TIME: 202505082322
Unit Type and Rh: 6200

## 2023-08-26 LAB — CORTISOL: Cortisol, Plasma: 16.7 ug/dL

## 2023-08-26 LAB — AMMONIA: Ammonia: 90 umol/L — ABNORMAL HIGH (ref 9–35)

## 2023-08-26 SURGERY — EGD (ESOPHAGOGASTRODUODENOSCOPY)
Anesthesia: Moderate Sedation

## 2023-08-26 MED ORDER — FENTANYL CITRATE PF 50 MCG/ML IJ SOSY
PREFILLED_SYRINGE | INTRAMUSCULAR | Status: AC
  Filled 2023-08-26: qty 2

## 2023-08-26 MED ORDER — SODIUM CHLORIDE 0.9 % IV BOLUS: 500.0000 mL | Freq: Once | INTRAVENOUS | Status: AC

## 2023-08-26 MED ORDER — ORAL CARE MOUTH RINSE
15.0000 mL | OROMUCOSAL | Status: DC
  Administered 2023-08-26 – 2023-08-31 (×62): 15 mL via OROMUCOSAL

## 2023-08-26 MED ORDER — ROCURONIUM BROMIDE 10 MG/ML (PF) SYRINGE
PREFILLED_SYRINGE | INTRAVENOUS | Status: AC
  Administered 2023-08-26: 60 mg
  Filled 2023-08-26: qty 10

## 2023-08-26 MED ORDER — SODIUM CHLORIDE 0.9% IV SOLUTION: Freq: Once | INTRAVENOUS | Status: DC

## 2023-08-26 MED ORDER — MIDAZOLAM HCL 2 MG/2ML IJ SOLN
INTRAMUSCULAR | Status: AC
Start: 2023-08-26 — End: 2023-08-26
  Administered 2023-08-26: 2 mg
  Filled 2023-08-26: qty 2

## 2023-08-26 MED ORDER — ETOMIDATE 2 MG/ML IV SOLN
INTRAVENOUS | Status: AC
  Administered 2023-08-26: 20 mg
  Filled 2023-08-26: qty 20

## 2023-08-26 MED ORDER — VASOPRESSIN 20 UNITS/100 ML INFUSION FOR SHOCK
INTRAVENOUS | Status: AC
  Filled 2023-08-26: qty 100

## 2023-08-26 MED ORDER — KETAMINE HCL 50 MG/5ML IJ SOSY
PREFILLED_SYRINGE | INTRAMUSCULAR | Status: AC
  Filled 2023-08-26: qty 10

## 2023-08-26 MED ORDER — ORAL CARE MOUTH RINSE: 15.0000 mL | OROMUCOSAL | Status: DC | PRN

## 2023-08-26 MED ORDER — EPINEPHRINE HCL 5 MG/250ML IV SOLN IN NS
INTRAVENOUS | Status: AC
  Filled 2023-08-26: qty 250

## 2023-08-26 MED ORDER — PROPOFOL 1000 MG/100ML IV EMUL
0.0000 ug/kg/min | INTRAVENOUS | Status: DC
  Administered 2023-08-26: 5 ug/kg/min via INTRAVENOUS
  Administered 2023-08-26: 30 ug/kg/min via INTRAVENOUS
  Administered 2023-08-27: 35 ug/kg/min via INTRAVENOUS
  Administered 2023-08-27: 45 ug/kg/min via INTRAVENOUS
  Administered 2023-08-27: 20 ug/kg/min via INTRAVENOUS
  Administered 2023-08-28: 45 ug/kg/min via INTRAVENOUS
  Administered 2023-08-28: 35 ug/kg/min via INTRAVENOUS
  Administered 2023-08-29: 45 ug/kg/min via INTRAVENOUS
  Administered 2023-08-29: 35 ug/kg/min via INTRAVENOUS
  Filled 2023-08-26 (×11): qty 100

## 2023-08-26 MED ORDER — FENTANYL CITRATE PF 50 MCG/ML IJ SOSY: 25.0000 ug | PREFILLED_SYRINGE | INTRAMUSCULAR | Status: DC | PRN

## 2023-08-26 MED ORDER — NOREPINEPHRINE 4 MG/250ML-% IV SOLN
2.0000 ug/min | INTRAVENOUS | Status: DC
  Administered 2023-08-26: 2 ug/min via INTRAVENOUS
  Filled 2023-08-26 (×2): qty 250

## 2023-08-26 MED ORDER — EPINEPHRINE 1 MG/10ML IJ SOSY
PREFILLED_SYRINGE | INTRAMUSCULAR | Status: AC
Start: 2023-08-26 — End: 2023-08-26
  Filled 2023-08-26: qty 10

## 2023-08-26 MED ORDER — LACTULOSE 10 GM/15ML PO SOLN: 30.0000 g | Freq: Three times a day (TID) | ORAL | Status: DC

## 2023-08-26 MED ORDER — SODIUM CHLORIDE 0.9 % IV SOLN: INTRAVENOUS | Status: AC

## 2023-08-26 MED ORDER — FENTANYL CITRATE PF 50 MCG/ML IJ SOSY
25.0000 ug | PREFILLED_SYRINGE | INTRAMUSCULAR | Status: DC | PRN
  Administered 2023-08-26 – 2023-08-29 (×7): 50 ug via INTRAVENOUS
  Filled 2023-08-26 (×6): qty 1
  Filled 2023-08-26: qty 2
  Filled 2023-08-26: qty 1

## 2023-08-26 MED ORDER — SODIUM CHLORIDE 0.9% IV SOLUTION: Freq: Once | INTRAVENOUS | Status: AC

## 2023-08-26 MED ORDER — LACTATED RINGERS IV BOLUS
1000.0000 mL | Freq: Once | INTRAVENOUS | Status: AC
  Administered 2023-08-26: 1000 mL via INTRAVENOUS

## 2023-08-26 MED ORDER — STERILE WATER FOR INJECTION IV SOLN
INTRAVENOUS | Status: DC
  Filled 2023-08-26: qty 1000
  Filled 2023-08-26: qty 150
  Filled 2023-08-26: qty 1000
  Filled 2023-08-26 (×3): qty 150
  Filled 2023-08-26: qty 1000

## 2023-08-26 MED ORDER — HYDROCORTISONE SOD SUC (PF) 100 MG IJ SOLR
100.0000 mg | Freq: Two times a day (BID) | INTRAMUSCULAR | Status: DC
  Administered 2023-08-26 – 2023-08-27 (×3): 100 mg via INTRAVENOUS
  Filled 2023-08-26 (×3): qty 2

## 2023-08-26 MED ORDER — LACTULOSE 10 GM/15ML PO SOLN
30.0000 g | Freq: Three times a day (TID) | ORAL | Status: DC
  Administered 2023-08-26 – 2023-08-31 (×10): 30 g
  Filled 2023-08-26 (×11): qty 45

## 2023-08-26 MED ORDER — SODIUM CHLORIDE 0.9 % IV SOLN: 250.0000 mL | INTRAVENOUS | Status: AC

## 2023-08-26 MED ORDER — PHENYLEPHRINE 80 MCG/ML (10ML) SYRINGE FOR IV PUSH (FOR BLOOD PRESSURE SUPPORT)
PREFILLED_SYRINGE | INTRAVENOUS | Status: AC
  Administered 2023-08-26: 240 ug
  Filled 2023-08-26: qty 10

## 2023-08-26 MED ORDER — IOHEXOL 350 MG/ML SOLN
100.0000 mL | Freq: Once | INTRAVENOUS | Status: AC | PRN
  Administered 2023-08-26: 100 mL via INTRAVENOUS

## 2023-08-26 MED ORDER — VITAMIN K1 10 MG/ML IJ SOLN: 10.0000 mg | Freq: Once | INTRAVENOUS | Status: DC

## 2023-08-26 MED ORDER — EPINEPHRINE HCL 5 MG/250ML IV SOLN IN NS
0.5000 ug/min | INTRAVENOUS | Status: DC
  Administered 2023-08-26: 5 ug/min via INTRAVENOUS

## 2023-08-26 MED ORDER — NOREPINEPHRINE 4 MG/250ML-% IV SOLN
0.0000 ug/min | INTRAVENOUS | Status: DC
  Administered 2023-08-26: 40 ug/min via INTRAVENOUS
  Administered 2023-08-26: 39 ug/min via INTRAVENOUS
  Administered 2023-08-26: 23 ug/min via INTRAVENOUS
  Administered 2023-08-26: 33 ug/min via INTRAVENOUS
  Administered 2023-08-26: 35 ug/min via INTRAVENOUS
  Administered 2023-08-26: 33 ug/min via INTRAVENOUS
  Administered 2023-08-27: 21 ug/min via INTRAVENOUS
  Administered 2023-08-27: 9 ug/min via INTRAVENOUS
  Administered 2023-08-27: 18 ug/min via INTRAVENOUS
  Administered 2023-08-27: 21 ug/min via INTRAVENOUS
  Administered 2023-08-28: 4 ug/min via INTRAVENOUS
  Administered 2023-08-29: 11 ug/min via INTRAVENOUS
  Filled 2023-08-26 (×12): qty 250

## 2023-08-26 MED ORDER — MIDAZOLAM HCL 2 MG/2ML IJ SOLN
1.0000 mg | INTRAMUSCULAR | Status: DC | PRN
  Administered 2023-08-26: 2 mg via INTRAVENOUS
  Filled 2023-08-26: qty 2

## 2023-08-26 MED ORDER — FENTANYL CITRATE (PF) 100 MCG/2ML IJ SOLN
INTRAMUSCULAR | Status: AC
  Administered 2023-08-26: 50 ug
  Filled 2023-08-26: qty 2

## 2023-08-26 MED ORDER — VASOPRESSIN 20 UNITS/100 ML INFUSION FOR SHOCK
0.0000 [IU]/min | INTRAVENOUS | Status: DC
  Administered 2023-08-26 – 2023-08-27 (×3): 0.03 [IU]/min via INTRAVENOUS
  Filled 2023-08-26 (×2): qty 100

## 2023-08-26 MED ORDER — SODIUM CHLORIDE 0.9 % IV BOLUS
500.0000 mL | Freq: Once | INTRAVENOUS | Status: AC
  Administered 2023-08-26: 500 mL via INTRAVENOUS

## 2023-08-26 MED ORDER — SUCCINYLCHOLINE CHLORIDE 200 MG/10ML IV SOSY
PREFILLED_SYRINGE | INTRAVENOUS | Status: AC
  Filled 2023-08-26: qty 10

## 2023-08-26 MED ORDER — SODIUM BICARBONATE 8.4 % IV SOLN
INTRAVENOUS | Status: AC
  Administered 2023-08-26: 50 meq
  Filled 2023-08-26: qty 50

## 2023-08-26 MED ORDER — SODIUM CHLORIDE 0.9 % IV SOLN
50.0000 ug/h | INTRAVENOUS | Status: DC
  Administered 2023-08-26 – 2023-08-28 (×5): 50 ug/h via INTRAVENOUS
  Filled 2023-08-26 (×7): qty 1

## 2023-08-26 NOTE — Progress Notes (Incomplete)
 [facility]  PROGRESS NOTE  Kevin Velez ZOX:[id number] DOB: December 11, 1950 DOA: 08/23/2023 PCP: Patient, No Pcp Per   Brief HPI:     73 y.o. male with medical history significant of type 2 diabetes, hypertension, GERD, alcoholic liver cirrhosis/failure who was recently admitted and discharged to SNF from 08/08/2023 until 08/20/2023 due to lower GI bleed.  He is returning today from [facility] nursing facility due to rectal bleeding that started this morning when the patient started passing large bloody clots along with melanotic stools.  He also has abdominal pain with distention due to ascites.    Assessment/Plan:    Bleeding duodenal ulcer -CTA GI bleed showed active high-volume upper GI bleeding from duodenal bulb region. - IR was consulted, patient underwent mesenteric angiogram, gastroduodenal artery coil embolization - Bleeding seems to have stopped - Continue pantoprazole  40 mg IV twice daily  Acute on chronic blood loss anemia - Secondary to above, hemoglobin is down to 7.3 -Transfuse for Hb less than 7 - Follow CBC in a.m.  Alcoholic cirrhosis of liver with ascites - MELD score 25 - Continue octreotide  infusion - Continue ceftriaxone  for SBP prophylaxis - Continue prednisone for alcoholic hepatitis    Active Problems:   Protein-calorie malnutrition, severe In the setting of liver failure. Prognosis is poor.     Hypokalemia Replete Magnesium  was supplemented.     Leukocytosis On  ceftriaxone  as above.      Medications     sodium chloride    Intravenous Once   sodium chloride    Intravenous Once   Chlorhexidine  Gluconate Cloth  6 each Topical Daily   pantoprazole  (PROTONIX ) IV  40 mg Intravenous Q12H     Data Reviewed:   CBG:  Recent Labs  Lab 08/19/23 1144 08/19/23 1704  GLUCAP 157* 108*    SpO2: 98 % O2 Flow Rate (L/min): 2 L/min    Vitals:   08/26/23 0615 08/26/23 0700 08/26/23 0740 08/26/23 0800  BP: 104/66 115/77  (!) 81/53  Pulse:  (!) 104 99 96 97  Resp: 17 19 13 19   Temp:  98 F (36.7 C) 98 F (36.7 C) 98 F (36.7 C)  TempSrc:  Oral Oral Oral  SpO2: 98% 99% 98% 98%  Weight:      Height:          Data Reviewed:  Basic Metabolic Panel: Recent Labs  Lab 08/20/23 0338 08/23/23 0822 08/23/23 1500 08/23/23 2314 08/24/23 0609 08/26/23 0709  NA 142 140 140  --  135 142  K 3.6 3.4* 3.1* 3.7 3.9 4.0  CL 113* 113* 112*  --  111 115*  CO2 19* 20* 18*  --  15* 15*  GLUCOSE 87 118* 112*  --  101* 120*  BUN 30* 34* 33*  --  28* 41*  CREATININE 0.70 0.90 0.68  --  0.77 1.13  CALCIUM 9.8 9.5 9.2  --  8.8* 8.4*  MG 1.8  --  1.8  --   --  1.7  PHOS  --   --  3.9  --   --   --     CBC: Recent Labs  Lab 08/20/23 0338 08/23/23 0822 08/23/23 1500 08/24/23 0609 08/25/23 0802 08/25/23 2149 08/26/23 0102 08/26/23 0405 08/26/23 0709  WBC 16.1* 20.4*  --  13.2* 12.9* 14.5*  --   --  14.6*  NEUTROABS 14.0*  --   --   --   --   --   --   --  11.8*  HGB  9.5* 8.1*   < > 9.0* 7.3* 6.3* 7.1* 8.7* 9.8*  HCT 28.6* 24.9*   < > 26.1* 21.1* 18.9* 21.0* 25.5* 27.9*  MCV 88.8 93.3  --  89.1 90.6 91.7  --   --  87.5  PLT 119* 151  --  95* 91* 120*  --   --  111*   < > = values in this interval not displayed.    LFT Recent Labs  Lab 08/20/23 0338 08/23/23 0822 08/24/23 0609 08/26/23 0709  AST 115* 106* 133* 130*  ALT 113* 111* 112* 100*  ALKPHOS 88 91 74 60  BILITOT 20.0* 17.1* 16.3* 17.8*  PROT 6.1* 5.6* 5.1* 4.2*  ALBUMIN  2.4* 2.2* 2.0* 1.7*     Antibiotics: Anti-infectives (From admission, onward)    Start     Dose/Rate Route Frequency Ordered Stop   08/24/23 0800  cefTRIAXone  (ROCEPHIN ) 2 g in sodium chloride  0.9 % 100 mL IVPB        2 g 200 mL/hr over 30 Minutes Intravenous Every 24 hours 08/23/23 1420     08/23/23 0915  cefTRIAXone  (ROCEPHIN ) 1 g in sodium chloride  0.9 % 100 mL IVPB        1 g 200 mL/hr over 30 Minutes Intravenous  Once 08/23/23 0909 08/23/23 1143        DVT prophylaxis:  SCDs  Code Status: Full code  Family Communication: Discussed with patient's brother at bedside   CONSULTS    Subjective      Objective    Physical Examination:      Status is: Inpatient:             Kevin Velez   [facility] If 7PM-7AM, please contact night-coverage at [email or web address], Office  [phone]   08/26/2023, 8:13 AM  LOS: 3 days

## 2023-08-26 NOTE — Progress Notes (Signed)
 LG BM, able to measure 700 mL bloody bowel output. Informed CCM/GI of blood loss/current v/s. Cont. Current plan of care at this time

## 2023-08-26 NOTE — Plan of Care (Signed)
  Problem: Clinical Measurements: Goal: Ability to maintain clinical measurements within normal limits will improve Outcome: Progressing   Problem: Clinical Measurements: Goal: Will remain free from infection Outcome: Progressing   Problem: Activity: Goal: Risk for activity intolerance will decrease Outcome: Progressing   Problem: Nutrition: Goal: Adequate nutrition will be maintained Outcome: Progressing   Problem: Coping: Goal: Level of anxiety will decrease Outcome: Progressing   

## 2023-08-26 NOTE — IPAL (Signed)
  Interdisciplinary Goals of Care Family Meeting   Date carried out: 08/26/2023  Location of the meeting: Bedside  Member's involved: Physician, Nurse Practitioner, Bedside Registered Nurse, and Family Member or next of kin  Durable Power of Attorney or acting medical decision maker: Royston Cornea Mehra the patient's brother  Discussion: We discussed goals of care for Kevin Velez .  Royston Cornea offers that Guillermo's baseline health is poor and he is been fighting and struggling for a long time.  He understands that his acute illness is quite severe and life-threatening due to hemorrhagic shock with unresolved GI bleeding despite IR embolization and upper endoscopy this morning.  We are providing maximal medical support with mechanical ventilation, multiple vasopressors, sodium bicarb infusion, and blood product transfusion.  Despite this treatment he remains hypotensive.  He does not believe that Damoney would want to go through resuscitative efforts in this setting if his chance of a meaningful recovery was poor.  He elects for DO NOT RESUSCITATE status at this point but to continue all aggressive medical therapies up until the point of possible arrest.  Code status:   Code Status: Do not attempt resuscitation (DNR) PRE-ARREST INTERVENTIONS DESIRED   Disposition: Continue current acute care  Time spent for the meeting: 12 minutes    Lemmie Pyo, NP  08/26/2023, 1:45 PM

## 2023-08-26 NOTE — Op Note (Signed)
 [facility] Patient Name: Kevin Velez Procedure Date: 08/26/2023 MRN: [id number] Attending MD: [name] , MD, [id number] Date of Birth: 1950-10-12 CSN: [id number] Age: 73 Admit Type: Inpatient Procedure:                Upper GI endoscopy Indications:              Acute post hemorrhagic anemia, Melena Providers:                [name], MD, Golda Latch, RN, Marvelyn Slim, Technician Referring MD:              Medicines:                Propofol  infusion On drip mcg/kg/min IV Complications:            No immediate complications. Estimated Blood Loss:     Estimated blood loss: none. Procedure:                Pre-Anesthesia Assessment:                           - Prior to the procedure, a History and Physical                            was performed, and patient medications and                            allergies were reviewed. The patient's tolerance of                            previous anesthesia was also reviewed. The risks                            and benefits of the procedure and the sedation                            options and risks were discussed with the patient.                            All questions were answered, and informed consent                            was obtained. Prior Anticoagulants: The patient has                            taken no anticoagulant or antiplatelet agents. ASA                            Grade Assessment: III - A patient with severe                            systemic disease. After reviewing the risks and  benefits, the patient was deemed in satisfactory                            condition to undergo the procedure.                           After obtaining informed consent, the endoscope was                            passed under direct vision. Throughout the                            procedure, the patient's blood pressure, pulse, and                             oxygen saturations were monitored continuously. The                            [id number]-[id number] ([id number]) Olympus endoscope was introduced                            through the mouth, and advanced to the third part                            of duodenum. The upper GI endoscopy was                            accomplished without difficulty. The patient                            tolerated the procedure well. Scope In: Scope Out: Findings:      A Tiny hiatal hernia was present. No varices were seen      Mild portal hypertensive gastropathy was found in the gastric body. But       no blood was seen in the stomach      One non-bleeding cratered small duodenal ulcer with no stigmata of       bleeding was found in the duodenal bulb.      Large clot was found in the distal duodenal bulb. Which could not be       washed off and all the other blood from the proximal duodenum was washed       and suctioned and we could not make the clot bleed with lots of washing       and we elected not to remove it based on his tenuous status      The cardia and gastric fundus were normal on retroflexion.      The exam was otherwise without abnormality.      The second portion of the duodenum and third portion of the duodenum       were normal. Impression:               - Tiny hiatal hernia.                           - Portal hypertensive gastropathy.                           -  Non-bleeding duodenal ulcer with no stigmata of                            bleeding.                           - Blood in the duodenal bulb.                           - The examination was otherwise normal.                           - Normal second portion of the duodenum and third                            portion of the duodenum.                           - No specimens collected. Moderate Sedation:      Not Applicable - Patient had care per Anesthesia. Recommendation:           - NPO. Okay for NG or OG for meds and particularly                             lactulose                            - Continue present medications.                           - Repeat upper endoscopy PRN If signs of ongoing                            bleeding.                           - Telephone GI clinic if symptomatic PRN. Will ask                            my partner Dr. Veronda Goody to check on tomorrow Procedure Code(s):        --- Professional ---                           [phone], Esophagogastroduodenoscopy, flexible,                            transoral; diagnostic, including collection of                            specimen(s) by brushing or washing, when performed                            (separate procedure) Diagnosis Code(s):        --- Professional ---                           K44.9, Diaphragmatic  hernia without obstruction or                            gangrene                           K76.6, Portal hypertension                           K31.89, Other diseases of stomach and duodenum                           K26.9, Duodenal ulcer, unspecified as acute or                            chronic, without hemorrhage or perforation                           K92.2, Gastrointestinal hemorrhage, unspecified                           D62, Acute posthemorrhagic anemia                           K92.1, Melena (includes Hematochezia) CPT copyright 2022 American Medical Association. All rights reserved. The codes documented in this report are preliminary and upon coder review may  be revised to meet current compliance requirements. [name], MD 08/26/2023 12:28:05 PM This report has been signed electronically. Number of Addenda: 0

## 2023-08-26 NOTE — Progress Notes (Signed)
 Notified Lab Corrie Dandy) that ABG being sent for analysis.

## 2023-08-26 NOTE — Progress Notes (Signed)
 Patient in EGD at bedside, pressure drop to MAP 39. Informed CCM, CCM at bedside, see orders for IV admin for emergent BP control.

## 2023-08-26 NOTE — Progress Notes (Signed)
 PCCM INTERVAL PROGRESS NOTE   Shortly after EGD the patient passed a large melanotic stool and subsequently had sharp decline in blood pressure.  We rapidly escalating doses of vasopressors and sent for blood products.  There was a very tenuous course that included the addition of vasopressin  and epinephrine  infusions as well as phenylephrine  IV pushes.  Bicarb infusion was increased to 125 ml/h.  He is now status post 3 units of PRBC and is due to receive 1 unit of FFP and 1 unit of platelets.  CBCs are scheduled every 6 hours.  He has been down for CT angiogram GI bleed study of which the read is pending.  His blood pressures improved significantly with blood transfusion and pressors have been weaned.  Family's been updated.   Roz Cornelia, AGACNP-BC [facility]  See Amion for personal pager PCCM on call pager [phone] until 7pm. Please call Elink 7p-7a. [phone]  08/26/2023 5:09 PM

## 2023-08-26 NOTE — Progress Notes (Signed)
 NAME:  Kevin Velez, MRN:  [id number], DOB:  1950-09-16, LOS: 3 ADMISSION DATE:  08/23/2023, CONSULTATION DATE:  08/26/2023  REFERRING MD:  Quin Brush APP, CHIEF COMPLAINT:  GI bleeding, hypotension   History of Present Illness:  73 year old man, group home with a recent admission 4/21 to 5/3 with upper GI bleed and acute liver failure.  He presented with hemoglobin of 3, confusion and encephalopathy and was found to have cirrhosis of the liver.  He underwent paracentesis 4/23 which was negative for SBP he underwent EGD on 4/27 when he developed hematochezia and shock, that showed large duodenal ulcer, visible incident with active bleeding that was treated with epinephrine  injection, cautery numerous small with melanotic stools esophageal varices.  Last transfusion was 4/28 and he was discharged to SNF on 5/3 with bilirubin of 20 He was readmitted 5/6 for  hematochezia with melena , CT angio showed active high volume upper GI bleeding from duodenal bulb region. Hb 8.1,  Platelets 151, INR 2.0, bilirubin was 17.1. He underwent mesenteric angiogram and gastroduodenal artery embolization. He was seen by [facility] He developed recurrent bleeding and hypotension on 5/8 with drop in hemoglobin from 7.3-6.3, transfuse 1 unit PRBC, started on Levophed . GI emergently called, PCCM consulted   Pertinent  Medical History  type 2 diabetes,  hypertension,  alcoholic liver cirrhosis  Significant Hospital Events: Including procedures, antibiotic start and stop dates in addition to other pertinent events   5/6 admit for GI bleeding > Gastroduodenal artery embolization.  5/8 recurrent bleeding, hypotension. To ICU > Pressors > additional blood products.   Interim History / Subjective:  3 additional bloody bowel movements overnight.  Hgb 9.3 this morning No complaints, but patient is poorly responsive Increasing vasopressor requirements  Objective    Blood pressure (!) 81/53, pulse 97, temperature 98 F  (36.7 C), temperature source Oral, resp. rate 19, height 5\' 7"  (1.702 m), weight 62.3 kg, SpO2 98%.        Intake/Output Summary (Last 24 hours) at 08/26/2023 0828 Last data filed at 08/26/2023 0727 Gross per 24 hour  Intake 2395.75 ml  Output 450 ml  Net 1945.75 ml   Filed Weights   08/23/23 0745 08/23/23 1450  Weight: 50.8 kg 62.3 kg    Examination:  General: Thin elderly appearing male in NAD HENT: [place]/AT, PERRL, no JVD Lungs: Clear bilaterally. No accessory muscle use.  Cardiovascular: RRR, no MRG Abdomen: distended, diffusely tender.  Extremities: No acute deformity or edema.  Neuro: Alert, oriented, non-focal   Labs show hemoglobin 7.1, platelets 1 20K , last INR 2.0 on 5/6 , bilirubin 16 Resolved Hospital Problem list     Assessment & Plan:   Upper GI bleed due to duodenal ulcer, known varices as well, but has not had bleeding of these as far as we know. - Continue octreotide  and BID PPI - GI following, plans for endoscopy today.  - Will discuss with GI, but he may need to be intubated prior to scope for safety. Happy to assist.  - Ceftriaxone  for GI/SBP  Hemorrhagic shock - s/p 3 units PRBC and 1 unit plt in the last 24 hours. 3 units FFP pending.  - NE infusion for MAP > 65 and SBP > 90. - Trend lactic acid - GI following for possible EGD - Had been on steroids, check cortisol.   Coagulopathy - INR 2.3 - Received Kcentra previously in the admission.  - Vit K ordered and FFP administration pending  Acute metabolic encephalopathy:  Acidosis, shock, +/- hepatic encephalopathy - Start Bicarb drip - Check ammonia - Support hemodynamics as above - Low threshold for intubation/airway protection.   Non-gap metabolic acidosis - Start Bicarb infusion.  - Trend chemistry  Thrombocytopenia is mild and does not need platelet transfusions as long as more than 50K - Follow CBC  EtOH hepatitis/cirrhosis/acute liver failure -was on prednisone but this has been DC'd.   - Follow LFTs  Best Practice (right click and "Reselect all SmartList Selections" daily)   Diet/type: NPO DVT prophylaxis SCD Pressure ulcer(s): N/A GI prophylaxis: PPI Lines: N/A Foley:  N/A Code Status:  full code Last date of multidisciplinary goals of care discussion [NA]  Labs   CBC: Recent Labs  Lab 08/20/23 0338 08/23/23 0822 08/23/23 1500 08/24/23 0609 08/25/23 0802 08/25/23 2149 08/26/23 0102 08/26/23 0405 08/26/23 0709  WBC 16.1* 20.4*  --  13.2* 12.9* 14.5*  --   --  14.6*  NEUTROABS 14.0*  --   --   --   --   --   --   --  11.8*  HGB 9.5* 8.1*   < > 9.0* 7.3* 6.3* 7.1* 8.7* 9.8*  HCT 28.6* 24.9*   < > 26.1* 21.1* 18.9* 21.0* 25.5* 27.9*  MCV 88.8 93.3  --  89.1 90.6 91.7  --   --  87.5  PLT 119* 151  --  95* 91* 120*  --   --  111*   < > = values in this interval not displayed.    Basic Metabolic Panel: Recent Labs  Lab 08/20/23 0338 08/23/23 0822 08/23/23 1500 08/23/23 2314 08/24/23 0609 08/26/23 0709  NA 142 140 140  --  135 142  K 3.6 3.4* 3.1* 3.7 3.9 4.0  CL 113* 113* 112*  --  111 115*  CO2 19* 20* 18*  --  15* 15*  GLUCOSE 87 118* 112*  --  101* 120*  BUN 30* 34* 33*  --  28* 41*  CREATININE 0.70 0.90 0.68  --  0.77 1.13  CALCIUM 9.8 9.5 9.2  --  8.8* 8.4*  MG 1.8  --  1.8  --   --  1.7  PHOS  --   --  3.9  --   --   --    GFR: Estimated Creatinine Clearance: 52.8 mL/min (by C-G formula based on SCr of 1.13 mg/dL). Recent Labs  Lab 08/24/23 0609 08/25/23 0802 08/25/23 2149 08/26/23 0709  WBC 13.2* 12.9* 14.5* 14.6*    Liver Function Tests: Recent Labs  Lab 08/20/23 0338 08/23/23 0822 08/24/23 0609 08/26/23 0709  AST 115* 106* 133* 130*  ALT 113* 111* 112* 100*  ALKPHOS 88 91 74 60  BILITOT 20.0* 17.1* 16.3* 17.8*  PROT 6.1* 5.6* 5.1* 4.2*  ALBUMIN  2.4* 2.2* 2.0* 1.7*   No results for input(s): "LIPASE", "AMYLASE" in the last 168 hours. Recent Labs  Lab 08/23/23 1500  AMMONIA 34    ABG    Component Value  Date/Time   PHART 7.5 (H) 08/08/2023 2100   PCO2ART 25 (L) 08/08/2023 2100   PO2ART 305 (H) 08/08/2023 2100   HCO3 19.5 (L) 08/08/2023 2100   TCO2 31 03/20/2007 1527   ACIDBASEDEF 2.1 (H) 08/08/2023 2100   O2SAT 99.9 08/08/2023 2100     Coagulation Profile: Recent Labs  Lab 08/23/23 0823 08/26/23 0405  INR 2.0* 2.3*    Cardiac Enzymes: No results for input(s): "CKTOTAL", "CKMB", "CKMBINDEX", "TROPONINI" in the last 168 hours.  HbA1C: Hgb A1c MFr  Bld  Date/Time Value Ref Range Status  08/10/2023 09:56 PM 4.7 (L) 4.8 - 5.6 % Final    Comment:    (NOTE) Pre diabetes:          5.7%-6.4%  Diabetes:              >6.4%  Glycemic control for   <7.0% adults with diabetes   08/08/2023 04:10 PM QNSTST 4.8 - 5.6 % Final    Comment:    (NOTE) Test not performed. Insufficient specimen to perform or complete analysis. Montie Apley notified 08/10/2023-[place]         Prediabetes: 5.7 - 6.4         Diabetes: >6.4         Glycemic control for adults with diabetes: <7.0     CBG: Recent Labs  Lab 08/19/23 1144 08/19/23 1704 08/26/23 0808  GLUCAP 157* 108* 104*    Review of Systems:   Unable to obtin due toe xtremis  Past Medical History:  He,  has a past medical history of Diabetes mellitus without complication (HCC).   Surgical History:   Past Surgical History:  Procedure Laterality Date   ESOPHAGOGASTRODUODENOSCOPY N/A 08/14/2023   Procedure: EGD (ESOPHAGOGASTRODUODENOSCOPY);  Surgeon: Felecia Hopper, MD;  Location: [facility];  Service: Gastroenterology;  Laterality: N/A;   HOT HEMOSTASIS N/A 08/14/2023   Procedure: EGD, WITH ARGON PLASMA COAGULATION/GOLD PROBE;  Surgeon: Felecia Hopper, MD;  Location: [facility];  Service: Gastroenterology;  Laterality: N/A;   IR ANGIOGRAM VISCERAL SELECTIVE  08/23/2023   IR EMBO ART  VEN HEMORR LYMPH EXTRAV  INC GUIDE ROADMAPPING  08/23/2023   IR US  GUIDE VASC ACCESS RIGHT  08/23/2023   SCLEROTHERAPY  08/14/2023    Procedure: SCLEROTHERAPY;  Surgeon: Felecia Hopper, MD;  Location: [facility];  Service: Gastroenterology;;     Social History:   reports that he has been smoking. He has never used smokeless tobacco. He reports current alcohol use.   Family History:  His family history is not on file.   Allergies No Known Allergies   Home Medications  Prior to Admission medications   Medication Sig Start Date End Date Taking? Authorizing Provider  ascorbic acid (VITAMIN C) 500 MG tablet Take 500 mg by mouth daily.   Yes [provider]  cyanocobalamin (VITAMIN B12) 1000 MCG tablet Take 1,000 mcg by mouth daily.   Yes [provider]  folic acid  (FOLVITE ) 1 MG tablet Take 1 tablet (1 mg total) by mouth daily. 08/21/23  Yes Audria Leather, MD  lactulose  (CHRONULAC ) 10 GM/15ML solution Take 30 mLs (20 g total) by mouth 2 (two) times daily. 08/20/23  Yes Audria Leather, MD  midodrine  (PROAMATINE ) 10 MG tablet Take 1 tablet (10 mg total) by mouth 3 (three) times daily with meals. 08/20/23  Yes Audria Leather, MD  Multiple Vitamin (MULTIVITAMIN) tablet Take 1 tablet by mouth daily with breakfast.   Yes [provider]  pantoprazole  (PROTONIX ) 40 MG tablet Take 1 tablet (40 mg total) by mouth 2 (two) times daily before a meal. 08/20/23 10/19/23 Yes Audria Leather, MD  polyethylene glycol (MIRALAX  / GLYCOLAX ) 17 g packet Take 17 g by mouth daily as needed for moderate constipation. 08/20/23  Yes Audria Leather, MD  prednisoLONE  5 MG TABS tablet Take 8 tablets (40 mg total) by mouth daily for 16 days. 08/21/23 09/06/23 Yes Audria Leather, MD  sucralfate  (CARAFATE ) 1 GM/10ML suspension Take 10 mLs (1 g total) by mouth 4 (four) times daily -  with meals and at bedtime. 08/20/23  Yes Audria Leather, MD  thiamine  (VITAMIN B-1) 100 MG tablet Take 1 tablet (100 mg total) by mouth daily. 08/21/23  Yes Audria Leather, MD  Multiple Vitamin (MULTIVITAMIN WITH MINERALS) TABS tablet Take 1 tablet by mouth  daily. Patient not taking: Reported on 08/23/2023 08/21/23   Audria Leather, MD     Critical care time:  47 minutes      Roz Cornelia, AGACNP-BC [facility]  See Amion for personal pager PCCM on call pager [phone] until 7pm. Please call Elink 7p-7a. [phone]  08/26/2023 8:31 AM

## 2023-08-26 NOTE — Progress Notes (Signed)
 Patient transported on ventilator to CT and back to 1231 with no complications. Vitals stable.

## 2023-08-26 NOTE — Progress Notes (Addendum)
     CROSS COVER NOTE  NAME: Kevin Velez MRN: [id number] DOB : 1950-12-19 ATTENDING PHYSICIAN: [name], MD    Date of Service   08/26/2023   HPI/Events of Note   Patient developed hypotension at the beginning of shift and received 2 unit prcb and 1 unit of plts. Remained hypotensive despite resuscitation and now symptomatic requiring vasopressor.  Interventions   Assessment/Plan: GI Bleed  #Acute Upper/Lower, with evidence hemodynamic instability #Hemorrhagic shock due to Acute Blood Loss Anemia #Thrombocytopenia PMHx: Alcoholic cirrhosis of liver with ascites  S/p mesenteric angiogram, gastroduodenal artery coil embolization per IR on 5/6 -At least 2x IV access, 18 gauge or larger -IVF resuscitation and pressor to maintain MAP>65 -NPO -Octreotide  gtt -Protonix  40 mg IV BID -IR consulted s/p mesenteric angiogram, gastroduodenal artery coil embolization 5/6 -GI following, contacted [facility] Dr. Kimble Pennant at the beginning of shift and again by admitting~ recommended check INR, if INR is high will give FFP, transfuse more blood and continue octreotide  drip. No other further recommendation.  -Monitor for S/Sx of bleeding -Trend CBC (H&H q6h) -SCD's for VTE Prophylaxis (chemical ppx contraindicated) -Transfuse for Hgb <8  (has received 2 units pRBC's and 1 unit platelet so far) -Hold NSAIDs, steroids, ASA -PCCM consulted for pressor requirement           Breck Hollinger, DNP, CCRN, FNP-C, AGACNP-BC Acute Care & Family Nurse Practitioner  [facility]  See Amion for personal pager PCCM on call pager [phone] until 7 am

## 2023-08-26 NOTE — Procedures (Signed)
 Intubation Procedure Note  [name]  [id number]  Jun 03, 1950  Date:08/26/23  Time:10:15 AM   Provider Performing:Dawnielle Christiana B Kamya Watling    Procedure: Intubation (31500)  Indication(s) Respiratory Failure  Consent Risks of the procedure as well as the alternatives and risks of each were explained to the patient and/or caregiver.  Consent for the procedure was obtained and is signed in the bedside chart   Anesthesia Etomidate , Versed , Fentanyl , and Rocuronium   Time Out Verified patient identification, verified procedure, site/side was marked, verified correct patient position, special equipment/implants available, medications/allergies/relevant history reviewed, required imaging and test results available.   Sterile Technique Usual hand hygeine, masks, and gloves were used   Procedure Description Patient positioned in bed supine.  Sedation given as noted above.  Patient was intubated with endotracheal tube using Glidescope.  View was Grade 1 full glottis .  Number of attempts was 1.  Colorimetric CO2 detector was consistent with tracheal placement.   Complications/Tolerance None; patient tolerated the procedure well. Chest X-ray is ordered to verify placement.   EBL Minimal   Specimen(s) None

## 2023-08-26 NOTE — Progress Notes (Signed)
 eLink Physician-Brief Progress Note Patient Name: Kevin Velez DOB: 11/25/1950 MRN: [id number]   Date of Service  08/26/2023  HPI/Events of Note  Patient with known history of large duodenal ulcer with acute GI bleeding in the past who was admitted with GI bleeding, and transferred to the ICU for hemorrhagic shock and acute blood loss anemia.  eICU Interventions  New Patient Evaluation.        Kevin Velez 08/26/2023, 5:29 AM

## 2023-08-26 NOTE — Progress Notes (Signed)
 Kevin Velez 10:43 AM  Subjective: Patient with continual bleeding and encephalopathy and now intubated Case discussed with the ICU team  Objective: Vital signs stable on pressors exam please see preassessment evaluation BUN increased ammonia increased to 90 from 34 3 days ago LFTs about the same INR increased hemoglobin dropped to 6.3 increased with transfusion  Assessment: GI blood loss with recent duodenal ulcer with bleed status post endoscopy and coils in a patient with probably end-stage cirrhosis  Plan: Okay to proceed with endoscopy today with further workup and plans pending those findings  [facility]  office [phone] After 5PM or if no answer call [phone]

## 2023-08-26 NOTE — Consult Note (Signed)
 NAME:  Kevin Velez, MRN:  [id number], DOB:  [birth year], LOS: 3 ADMISSION DATE:  08/23/2023, CONSULTATION DATE:  08/26/2023  REFERRING MD:  Quin Brush APP, CHIEF COMPLAINT:  GI bleeding, hypotension   History of Present Illness:  73 year old man, group home with a recent admission 4/21 to 5/3 with upper GI bleed and acute liver failure.  He presented with hemoglobin of 3, confusion and encephalopathy and was found to have cirrhosis of the liver.  He underwent paracentesis 4/23 which was negative for SBP he underwent EGD on 4/27 when he developed hematochezia and shock, that showed large duodenal ulcer, visible incident with active bleeding that was treated with epinephrine  injection, cautery numerous small with melanotic stools esophageal varices.  Last transfusion was 4/28 and he was discharged to SNF on 5/3 with bilirubin of 20 He was readmitted 5/6 for  hematochezia with melena , CT angio showed active high volume upper GI bleeding from duodenal bulb region. Hb 8.1,  Platelets 151, INR 2.0, bilirubin was 17.1. He underwent mesenteric angiogram and gastroduodenal artery embolization. He was seen by [facility] He developed recurrent bleeding and hypotension on 5/8 with drop in hemoglobin from 7.3-6.3, transfuse 1 unit PRBC, started on Levophed . GI emergently called, PCCM consulted   Pertinent  Medical History  type 2 diabetes,  hypertension,  alcoholic liver cirrhosis  Significant Hospital Events: Including procedures, antibiotic start and stop dates in addition to other pertinent events     Interim History / Subjective:  RN reports last bleeding around midnight On 4 mics of Levophed  and octreotide  BP 111/65  Objective    Blood pressure (!) 87/53, pulse (!) 115, temperature 98.1 F (36.7 C), temperature source Axillary, resp. rate 17, height 5\' 7"  (1.702 m), weight 62.3 kg, SpO2 95%.        Intake/Output Summary (Last 24 hours) at 08/26/2023 0300 Last data filed at 08/26/2023  0148 Gross per 24 hour  Intake 1498.31 ml  Output 850 ml  Net 648.31 ml   Filed Weights   08/23/23 0745 08/23/23 1450  Weight: 50.8 kg 62.3 kg    Examination: General: Chronically ill-appearing, no distress, lying supine in bed HENT: [place], pallor, no JVD Lungs: Decreased breath sounds bilateral, no accessory muscle use Cardiovascular: S1-S2 regular, no murmur Abdomen: Soft, distended, fluid thrill Extremities: No edema, no deformity Neuro: Somnolent, follows commands GU: Condom catheter   Labs show hemoglobin 7.1, platelets 1 20K , last INR 2.0 on 5/6 , bilirubin 16 Resolved Hospital Problem list     Assessment & Plan:  Upper GI bleed due to duodenal ulcer replete Hemorrhagic shock  -Resuscitating with 2nd unit PRBC, Levophed  to maintain SBP 90, 2 more units PRBC ordered - Octreotide  per GI, this is not variceal bleeding - PPI twice daily - Ceftriaxone  for GI prophylaxis - Does need EGD and endoscopic retreatment of ulcer , GI has been contacted   Coagulopathy -recheck INR and transfuse FFP as needed  Thrombocytopenia is mild and does not need platelet transfusions as long as more than 50K  EtOH hepatitis/cirrhosis/acute liver failure -was on prednisone but this has been DC'd Follow LFTs  Best Practice (right click and "Reselect all SmartList Selections" daily)   Diet/type: NPO DVT prophylaxis SCD Pressure ulcer(s): N/A GI prophylaxis: PPI Lines: N/A Foley:  N/A Code Status:  full code Last date of multidisciplinary goals of care discussion [NA] brother updated at bedside  Labs   CBC: Recent Labs  Lab 08/20/23 0338 08/23/23 0822 08/23/23 1500 08/23/23  2314 08/24/23 0609 08/25/23 0802 08/25/23 2149 08/26/23 0102  WBC 16.1* 20.4*  --   --  13.2* 12.9* 14.5*  --   NEUTROABS 14.0*  --   --   --   --   --   --   --   HGB 9.5* 8.1*   < > 7.2* 9.0* 7.3* 6.3* 7.1*  HCT 28.6* 24.9*   < > 21.1* 26.1* 21.1* 18.9* 21.0*  MCV 88.8 93.3  --   --  89.1 90.6  91.7  --   PLT 119* 151  --   --  95* 91* 120*  --    < > = values in this interval not displayed.    Basic Metabolic Panel: Recent Labs  Lab 08/19/23 0522 08/20/23 0338 08/23/23 0822 08/23/23 1500 08/23/23 2314 08/24/23 0609  NA 141 142 140 140  --  135  K 3.2* 3.6 3.4* 3.1* 3.7 3.9  CL 113* 113* 113* 112*  --  111  CO2 20* 19* 20* 18*  --  15*  GLUCOSE 115* 87 118* 112*  --  101*  BUN 35* 30* 34* 33*  --  28*  CREATININE 0.79 0.70 0.90 0.68  --  0.77  CALCIUM 9.3 9.8 9.5 9.2  --  8.8*  MG 1.8 1.8  --  1.8  --   --   PHOS  --   --   --  3.9  --   --    GFR: Estimated Creatinine Clearance: 74.6 mL/min (by C-G formula based on SCr of 0.77 mg/dL). Recent Labs  Lab 08/23/23 0822 08/24/23 0609 08/25/23 0802 08/25/23 2149  WBC 20.4* 13.2* 12.9* 14.5*    Liver Function Tests: Recent Labs  Lab 08/19/23 0522 08/20/23 0338 08/23/23 0822 08/24/23 0609  AST 110* 115* 106* 133*  ALT 105* 113* 111* 112*  ALKPHOS 81 88 91 74  BILITOT 18.7* 20.0* 17.1* 16.3*  PROT 6.0* 6.1* 5.6* 5.1*  ALBUMIN  2.3* 2.4* 2.2* 2.0*   No results for input(s): "LIPASE", "AMYLASE" in the last 168 hours. Recent Labs  Lab 08/23/23 1500  AMMONIA 34    ABG    Component Value Date/Time   PHART 7.5 (H) 08/08/2023 2100   PCO2ART 25 (L) 08/08/2023 2100   PO2ART 305 (H) 08/08/2023 2100   HCO3 19.5 (L) 08/08/2023 2100   TCO2 31 03/20/2007 1527   ACIDBASEDEF 2.1 (H) 08/08/2023 2100   O2SAT 99.9 08/08/2023 2100     Coagulation Profile: Recent Labs  Lab 08/23/23 0823  INR 2.0*    Cardiac Enzymes: No results for input(s): "CKTOTAL", "CKMB", "CKMBINDEX", "TROPONINI" in the last 168 hours.  HbA1C: Hgb A1c MFr Bld  Date/Time Value Ref Range Status  08/10/2023 09:56 PM 4.7 (L) 4.8 - 5.6 % Final    Comment:    (NOTE) Pre diabetes:          5.7%-6.4%  Diabetes:              >6.4%  Glycemic control for   <7.0% adults with diabetes   08/08/2023 04:10 PM QNSTST 4.8 - 5.6 % Final     Comment:    (NOTE) Test not performed. Insufficient specimen to perform or complete analysis. Montie Apley notified 08/10/2023-[place]         Prediabetes: 5.7 - 6.4         Diabetes: >6.4         Glycemic control for adults with diabetes: <7.0     CBG: Recent Labs  Lab 08/19/23 0806 08/19/23 1144 08/19/23 1704  GLUCAP 103* 157* 108*    Review of Systems:   Unable to obtin due toe xtremis  Past Medical History:  He,  has a past medical history of Diabetes mellitus without complication (HCC).   Surgical History:   Past Surgical History:  Procedure Laterality Date   ESOPHAGOGASTRODUODENOSCOPY N/A 08/14/2023   Procedure: EGD (ESOPHAGOGASTRODUODENOSCOPY);  Surgeon: Felecia Hopper, MD;  Location: [facility];  Service: Gastroenterology;  Laterality: N/A;   HOT HEMOSTASIS N/A 08/14/2023   Procedure: EGD, WITH ARGON PLASMA COAGULATION/GOLD PROBE;  Surgeon: Felecia Hopper, MD;  Location: [facility];  Service: Gastroenterology;  Laterality: N/A;   IR ANGIOGRAM VISCERAL SELECTIVE  08/23/2023   IR EMBO ART  VEN HEMORR LYMPH EXTRAV  INC GUIDE ROADMAPPING  08/23/2023   IR US  GUIDE VASC ACCESS RIGHT  08/23/2023   SCLEROTHERAPY  08/14/2023   Procedure: SCLEROTHERAPY;  Surgeon: Felecia Hopper, MD;  Location: [facility];  Service: Gastroenterology;;     Social History:   reports that he has been smoking. He has never used smokeless tobacco. He reports current alcohol use.   Family History:  His family history is not on file.   Allergies No Known Allergies   Home Medications  Prior to Admission medications   Medication Sig Start Date End Date Taking? Authorizing Provider  ascorbic acid (VITAMIN C) 500 MG tablet Take 500 mg by mouth daily.   Yes [provider]  cyanocobalamin (VITAMIN B12) 1000 MCG tablet Take 1,000 mcg by mouth daily.   Yes [provider]  folic acid  (FOLVITE ) 1 MG tablet Take 1 tablet (1 mg total) by mouth daily. 08/21/23  Yes Audria Leather, MD  lactulose  (CHRONULAC ) 10 GM/15ML solution Take 30 mLs (20 g total) by mouth 2 (two) times daily. 08/20/23  Yes Audria Leather, MD  midodrine  (PROAMATINE ) 10 MG tablet Take 1 tablet (10 mg total) by mouth 3 (three) times daily with meals. 08/20/23  Yes Audria Leather, MD  Multiple Vitamin (MULTIVITAMIN) tablet Take 1 tablet by mouth daily with breakfast.   Yes [provider]  pantoprazole  (PROTONIX ) 40 MG tablet Take 1 tablet (40 mg total) by mouth 2 (two) times daily before a meal. 08/20/23 10/19/23 Yes Alekh, Kshitiz, MD  polyethylene glycol (MIRALAX  / GLYCOLAX ) 17 g packet Take 17 g by mouth daily as needed for moderate constipation. 08/20/23  Yes Audria Leather, MD  prednisoLONE  5 MG TABS tablet Take 8 tablets (40 mg total) by mouth daily for 16 days. 08/21/23 09/06/23 Yes Audria Leather, MD  sucralfate  (CARAFATE ) 1 GM/10ML suspension Take 10 mLs (1 g total) by mouth 4 (four) times daily -  with meals and at bedtime. 08/20/23  Yes Audria Leather, MD  thiamine  (VITAMIN B-1) 100 MG tablet Take 1 tablet (100 mg total) by mouth daily. 08/21/23  Yes Audria Leather, MD  Multiple Vitamin (MULTIVITAMIN WITH MINERALS) TABS tablet Take 1 tablet by mouth daily. Patient not taking: Reported on 08/23/2023 08/21/23   Audria Leather, MD     Critical care time: 51 m     Celene Coins MD. [facility]. [facility] Pager : [phone]  If no response to pager , please call [phone] until 7 pm After 7:00 pm call Elink  [phone]   08/26/2023

## 2023-08-26 NOTE — Procedures (Signed)
 Arterial Catheter Insertion Procedure Note  Kevin Velez  [id number]  1951-01-04  Date:08/26/23  Time:2:30 PM    Provider Performing: Lemmie Pyo    Procedure: Insertion of Arterial Line (62130) with US  guidance (86578)   Indication(s) Blood pressure monitoring and/or need for frequent ABGs  Consent Risks of the procedure as well as the alternatives and risks of each were explained to the patient and/or caregiver.  Consent for the procedure was obtained and is signed in the bedside chart  Anesthesia None   Time Out Verified patient identification, verified procedure, site/side was marked, verified correct patient position, special equipment/implants available, medications/allergies/relevant history reviewed, required imaging and test results available.   Sterile Technique Maximal sterile technique including full sterile barrier drape, hand hygiene, sterile gown, sterile gloves, mask, hair covering, sterile ultrasound probe cover (if used).   Procedure Description Area of catheter insertion was cleaned with chlorhexidine  and draped in sterile fashion. With real-time ultrasound guidance an arterial catheter was placed into the right radial artery.  Appropriate arterial tracings confirmed on monitor.     Complications/Tolerance None; patient tolerated the procedure well.   EBL Minimal   Specimen(s) None   Kevin Velez, AGACNP-BC [facility]  See Amion for personal pager PCCM on call pager [phone] until 7pm. Please call Elink 7p-7a. [phone]  08/26/2023 2:30 PM

## 2023-08-27 DIAGNOSIS — G9341 Metabolic encephalopathy: Secondary | ICD-10-CM | POA: Diagnosis not present

## 2023-08-27 DIAGNOSIS — R739 Hyperglycemia, unspecified: Secondary | ICD-10-CM

## 2023-08-27 DIAGNOSIS — N179 Acute kidney failure, unspecified: Secondary | ICD-10-CM

## 2023-08-27 DIAGNOSIS — K922 Gastrointestinal hemorrhage, unspecified: Secondary | ICD-10-CM | POA: Diagnosis not present

## 2023-08-27 DIAGNOSIS — D696 Thrombocytopenia, unspecified: Secondary | ICD-10-CM | POA: Diagnosis not present

## 2023-08-27 DIAGNOSIS — R571 Hypovolemic shock: Secondary | ICD-10-CM | POA: Diagnosis not present

## 2023-08-27 LAB — CBC
HCT: 28 % — ABNORMAL LOW (ref 39.0–52.0)
HCT: 27.7 % — ABNORMAL LOW (ref 39.0–52.0)
HCT: 24.2 % — ABNORMAL LOW (ref 39.0–52.0)
HCT: 21.6 % — ABNORMAL LOW (ref 39.0–52.0)
Hemoglobin: 10.1 g/dL — ABNORMAL LOW (ref 13.0–17.0)
Hemoglobin: 9.9 g/dL — ABNORMAL LOW (ref 13.0–17.0)
Hemoglobin: 8.8 g/dL — ABNORMAL LOW (ref 13.0–17.0)
Hemoglobin: 7.7 g/dL — ABNORMAL LOW (ref 13.0–17.0)
MCH: 30.2 pg (ref 26.0–34.0)
MCH: 30.5 pg (ref 26.0–34.0)
MCH: 30.6 pg (ref 26.0–34.0)
MCH: 30.7 pg (ref 26.0–34.0)
MCHC: 36.1 g/dL — ABNORMAL HIGH (ref 30.0–36.0)
MCHC: 35.7 g/dL (ref 30.0–36.0)
MCHC: 36.4 g/dL — ABNORMAL HIGH (ref 30.0–36.0)
MCHC: 35.6 g/dL (ref 30.0–36.0)
MCV: 83.8 fL (ref 80.0–100.0)
MCV: 85.2 fL (ref 80.0–100.0)
MCV: 84 fL (ref 80.0–100.0)
MCV: 86.1 fL (ref 80.0–100.0)
Platelets: 119 10*3/uL — ABNORMAL LOW (ref 150–400)
Platelets: 116 10*3/uL — ABNORMAL LOW (ref 150–400)
Platelets: 100 10*3/uL — ABNORMAL LOW (ref 150–400)
Platelets: 70 10*3/uL — ABNORMAL LOW (ref 150–400)
RBC: 3.34 MIL/uL — ABNORMAL LOW (ref 4.22–5.81)
RBC: 3.25 MIL/uL — ABNORMAL LOW (ref 4.22–5.81)
RBC: 2.88 MIL/uL — ABNORMAL LOW (ref 4.22–5.81)
RBC: 2.51 MIL/uL — ABNORMAL LOW (ref 4.22–5.81)
RDW: 15.3 % (ref 11.5–15.5)
RDW: 15.6 % — ABNORMAL HIGH (ref 11.5–15.5)
RDW: 15.4 % (ref 11.5–15.5)
RDW: 15.5 % (ref 11.5–15.5)
WBC: 13.5 10*3/uL — ABNORMAL HIGH (ref 4.0–10.5)
WBC: 15.3 10*3/uL — ABNORMAL HIGH (ref 4.0–10.5)
WBC: 13.4 10*3/uL — ABNORMAL HIGH (ref 4.0–10.5)
WBC: 11 10*3/uL — ABNORMAL HIGH (ref 4.0–10.5)
nRBC: 0.1 % (ref 0.0–0.2)
nRBC: 0 % (ref 0.0–0.2)
nRBC: 0.1 % (ref 0.0–0.2)
nRBC: 0 % (ref 0.0–0.2)

## 2023-08-27 LAB — COMPREHENSIVE METABOLIC PANEL WITH GFR
ALT: 127 U/L — ABNORMAL HIGH (ref 0–44)
ALT: 150 U/L — ABNORMAL HIGH (ref 0–44)
AST: 267 U/L — ABNORMAL HIGH (ref 15–41)
AST: 270 U/L — ABNORMAL HIGH (ref 15–41)
Albumin: 1.6 g/dL — ABNORMAL LOW (ref 3.5–5.0)
Albumin: 1.6 g/dL — ABNORMAL LOW (ref 3.5–5.0)
Alkaline Phosphatase: 47 U/L (ref 38–126)
Alkaline Phosphatase: 50 U/L (ref 38–126)
Anion gap: 13 (ref 5–15)
Anion gap: 10 (ref 5–15)
BUN: 37 mg/dL — ABNORMAL HIGH (ref 8–23)
BUN: 39 mg/dL — ABNORMAL HIGH (ref 8–23)
CO2: 16 mmol/L — ABNORMAL LOW (ref 22–32)
CO2: 18 mmol/L — ABNORMAL LOW (ref 22–32)
Calcium: 7.3 mg/dL — ABNORMAL LOW (ref 8.9–10.3)
Calcium: 7.3 mg/dL — ABNORMAL LOW (ref 8.9–10.3)
Chloride: 109 mmol/L (ref 98–111)
Chloride: 109 mmol/L (ref 98–111)
Creatinine, Ser: 1.32 mg/dL — ABNORMAL HIGH (ref 0.61–1.24)
Creatinine, Ser: 1.12 mg/dL (ref 0.61–1.24)
GFR, Estimated: 58 mL/min — ABNORMAL LOW (ref >60)
GFR, Estimated: >60 mL/min (ref >60)
Glucose, Bld: 259 mg/dL — ABNORMAL HIGH (ref 70–99)
Glucose, Bld: 255 mg/dL — ABNORMAL HIGH (ref 70–99)
Potassium: 3.7 mmol/L (ref 3.5–5.1)
Potassium: 3.6 mmol/L (ref 3.5–5.1)
Sodium: 138 mmol/L (ref 135–145)
Sodium: 137 mmol/L (ref 135–145)
Total Bilirubin: 14.7 mg/dL — ABNORMAL HIGH (ref 0.0–1.2)
Total Bilirubin: 14.7 mg/dL — ABNORMAL HIGH (ref 0.0–1.2)
Total Protein: 3.7 g/dL — ABNORMAL LOW (ref 6.5–8.1)
Total Protein: 3.7 g/dL — ABNORMAL LOW (ref 6.5–8.1)

## 2023-08-27 LAB — GLUCOSE, CAPILLARY
Glucose-Capillary: 234 mg/dL — ABNORMAL HIGH (ref 70–99)
Glucose-Capillary: 234 mg/dL — ABNORMAL HIGH (ref 70–99)
Glucose-Capillary: 232 mg/dL — ABNORMAL HIGH (ref 70–99)
Glucose-Capillary: 194 mg/dL — ABNORMAL HIGH (ref 70–99)
Glucose-Capillary: 167 mg/dL — ABNORMAL HIGH (ref 70–99)

## 2023-08-27 LAB — MAGNESIUM: Magnesium: 1.5 mg/dL — ABNORMAL LOW (ref 1.7–2.4)

## 2023-08-27 LAB — PROTIME-INR
INR: 2.5 — ABNORMAL HIGH (ref 0.8–1.2)
Prothrombin Time: 27 s — ABNORMAL HIGH (ref 11.4–15.2)

## 2023-08-27 LAB — BLOOD GAS, ARTERIAL
Acid-base deficit: 2.6 mmol/L — ABNORMAL HIGH (ref 0.0–2.0)
Bicarbonate: 20.2 mmol/L (ref 20.0–28.0)
Drawn by: 331471
FIO2: 40 %
MECHVT: 520 mL
O2 Saturation: 98.8 %
PEEP: 5 cmH2O
Patient temperature: 37
RATE: 18 {breaths}/min
pCO2 arterial: 29 mmHg — ABNORMAL LOW (ref 32–48)
pH, Arterial: 7.45 (ref 7.35–7.45)
pO2, Arterial: 87 mmHg (ref 83–108)

## 2023-08-27 LAB — DIC (DISSEMINATED INTRAVASCULAR COAGULATION)PANEL
D-Dimer, Quant: 2.4 ug{FEU}/mL — ABNORMAL HIGH (ref 0.00–0.50)
Fibrinogen: 152 mg/dL — ABNORMAL LOW (ref 210–475)
INR: 2.5 — ABNORMAL HIGH (ref 0.8–1.2)
Platelets: 118 10*3/uL — ABNORMAL LOW (ref 150–400)
Prothrombin Time: 27.6 s — ABNORMAL HIGH (ref 11.4–15.2)
Smear Review: NONE SEEN
aPTT: 51 s — ABNORMAL HIGH (ref 24–36)

## 2023-08-27 LAB — TRIGLYCERIDES: Triglycerides: 80 mg/dL (ref <150)

## 2023-08-27 LAB — LACTIC ACID, PLASMA: Lactic Acid, Venous: 3.7 mmol/L (ref 0.5–1.9)

## 2023-08-27 LAB — PHOSPHORUS: Phosphorus: 3.9 mg/dL (ref 2.5–4.6)

## 2023-08-27 MED ORDER — METRONIDAZOLE 500 MG/100ML IV SOLN
500.0000 mg | Freq: Two times a day (BID) | INTRAVENOUS | Status: DC
  Administered 2023-08-27 – 2023-08-31 (×9): 500 mg via INTRAVENOUS
  Filled 2023-08-27 (×9): qty 100

## 2023-08-27 MED ORDER — FUROSEMIDE 10 MG/ML IJ SOLN
40.0000 mg | Freq: Once | INTRAMUSCULAR | Status: AC
  Administered 2023-08-27: 40 mg via INTRAVENOUS
  Filled 2023-08-27: qty 4

## 2023-08-27 MED ORDER — FUROSEMIDE 10 MG/ML IJ SOLN
20.0000 mg | Freq: Once | INTRAMUSCULAR | Status: AC
  Administered 2023-08-27: 20 mg via INTRAVENOUS
  Filled 2023-08-27: qty 2

## 2023-08-27 MED ORDER — SUCRALFATE 1 GM/10ML PO SUSP
1.0000 g | Freq: Three times a day (TID) | ORAL | Status: DC
  Administered 2023-08-27 – 2023-08-31 (×17): 1 g
  Filled 2023-08-27 (×17): qty 10

## 2023-08-27 MED ORDER — ALBUMIN HUMAN 25 % IV SOLN
25.0000 g | Freq: Four times a day (QID) | INTRAVENOUS | Status: AC
  Administered 2023-08-27 (×3): 25 g via INTRAVENOUS
  Filled 2023-08-27 (×3): qty 100

## 2023-08-27 MED ORDER — INSULIN ASPART 100 UNIT/ML IJ SOLN
0.0000 [IU] | INTRAMUSCULAR | Status: DC
  Administered 2023-08-27: 2 [IU] via SUBCUTANEOUS
  Administered 2023-08-27: 3 [IU] via SUBCUTANEOUS
  Administered 2023-08-27: 2 [IU] via SUBCUTANEOUS
  Administered 2023-08-27: 3 [IU] via SUBCUTANEOUS
  Administered 2023-08-28 (×2): 1 [IU] via SUBCUTANEOUS
  Administered 2023-08-28: 2 [IU] via SUBCUTANEOUS
  Administered 2023-08-28 – 2023-08-29 (×4): 1 [IU] via SUBCUTANEOUS
  Administered 2023-08-30: 2 [IU] via SUBCUTANEOUS
  Administered 2023-08-30: 1 [IU] via SUBCUTANEOUS
  Administered 2023-08-30: 2 [IU] via SUBCUTANEOUS
  Administered 2023-08-30 – 2023-08-31 (×5): 1 [IU] via SUBCUTANEOUS

## 2023-08-27 MED ORDER — VITAMIN K1 10 MG/ML IJ SOLN
10.0000 mg | Freq: Every day | INTRAVENOUS | Status: DC
  Administered 2023-08-27 – 2023-08-31 (×5): 10 mg via INTRAVENOUS
  Filled 2023-08-27 (×5): qty 1

## 2023-08-27 NOTE — Plan of Care (Signed)
  Problem: Education: Goal: Knowledge of General Education information will improve Description: Including pain rating scale, medication(s)/side effects and non-pharmacologic comfort measures Outcome: Not Progressing   Problem: Clinical Measurements: Goal: Respiratory complications will improve Outcome: Not Progressing Goal: Cardiovascular complication will be avoided Outcome: Not Progressing   Problem: Nutrition: Goal: Adequate nutrition will be maintained Outcome: Not Progressing   Problem: Pain Managment: Goal: General experience of comfort will improve and/or be controlled Outcome: Not Progressing

## 2023-08-27 NOTE — Progress Notes (Signed)
 NAME:  Kevin Velez, MRN:  [id number], DOB:  27-Oct-1950, LOS: 4 ADMISSION DATE:  08/23/2023, CONSULTATION DATE:  08/27/2023  REFERRING MD:  Quin Brush APP, CHIEF COMPLAINT:  GI bleeding, hypotension   History of Present Illness:  73 year old man, group home with a recent admission 4/21 to 5/3 with upper GI bleed and acute liver failure.  He presented with hemoglobin of 3, confusion and encephalopathy and was found to have cirrhosis of the liver.  He underwent paracentesis 4/23 which was negative for SBP he underwent EGD on 4/27 when he developed hematochezia and shock, that showed large duodenal ulcer, visible incident with active bleeding that was treated with epinephrine  injection, cautery numerous small with melanotic stools esophageal varices.  Last transfusion was 4/28 and he was discharged to SNF on 5/3 with bilirubin of 20 He was readmitted 5/6 for  hematochezia with melena , CT angio showed active high volume upper GI bleeding from duodenal bulb region. Hb 8.1,  Platelets 151, INR 2.0, bilirubin was 17.1. He underwent mesenteric angiogram and gastroduodenal artery embolization. He was seen by [facility] He developed recurrent bleeding and hypotension on 5/8 with drop in hemoglobin from 7.3-6.3, transfuse 1 unit PRBC, started on Levophed . GI emergently called, PCCM consulted   Pertinent  Medical History  type 2 diabetes,  hypertension,  alcoholic liver cirrhosis  Significant Hospital Events: Including procedures, antibiotic start and stop dates in addition to other pertinent events   5/6 admit for GI bleeding > Gastroduodenal artery embolization.  5/8 recurrent bleeding, hypotension. To ICU > Pressors > additional blood products.  5/9 EGD: mild portal gastropathy, non-bleeding cratered small duodenal ulcer, clot at bulb of duodenem. Required significant transfusions. On levo/vaso/epi. Code status changed to DNR after discussion with brother.  Interim History / Subjective:   Small  amount of bleeding from OG tube and of bloody bowel movement overnight  CTA Abd showing tiny focus of linear enhancement near the anterior duodenum, no significant pooling of contrast - possible tiny focus of bleeding vs aberrant vessel that is more visible after embolization. Progressive edem and wall thickening of colon, no free air.   Brother updated at bedside  Objective    Blood pressure (!) 146/82, pulse 85, temperature 98.8 F (37.1 C), temperature source Axillary, resp. rate 18, height 5\' 7"  (1.702 m), weight 62.3 kg, SpO2 98%.    Vent Mode: PRVC FiO2 (%):  [40 %-100 %] 40 % Set Rate:  [16 bmp-26 bmp] 18 bmp Vt Set:  [520 mL] 520 mL PEEP:  [5 cmH20] 5 cmH20 Plateau Pressure:  [13 cmH20-19 cmH20] 19 cmH20   Intake/Output Summary (Last 24 hours) at 08/27/2023 0731 Last data filed at 08/27/2023 0724 Gross per 24 hour  Intake 8641.61 ml  Output 1675 ml  Net 6966.61 ml   Filed Weights   08/23/23 0745 08/23/23 1450  Weight: 50.8 kg 62.3 kg    Examination:  General: Thin elderly appearing male in NAD HENT: [place]/AT, PERRL, no JVD Lungs: Clear bilaterally. No accessory muscle use.  Cardiovascular: RRR, no MRG Abdomen: distended, diffusely tender.  Extremities: No acute deformity or edema.  Neuro: Alert, oriented, non-focal   Labs show hemoglobin 7.1, platelets 1 20K , last INR 2.0 on 5/6 , bilirubin 16 Resolved Hospital Problem list     Assessment & Plan:   Inability to protect airway - intubated 5/9 - continue ventilator support - propofol /fentanyl  for sedation - SAT/SBT daily  Upper GI bleed due to duodenal ulcer, portal gastropathy,  and known varices (non-bleeding) - Continue octreotide  and BID PPI - add carafate  today - GI following - Ceftriaxone  for GI/SBP  Hemorrhagic shock - Vasopressors for MAP > 65 and SBP > 90. - Trend lactic acid - trend CBC q6hrs - on stress dose steroids  Coagulopathy Thrombocytopenia - INR 2.5 - s/p FFP and vitamin  K - check DIC panel and transfuse as needed - will consider sending TEG evaluation  Acute metabolic encephalopathy:  - Continue lactulose  - Support hemodynamics as above  AKI Non-gap metabolic acidosis - Continue bicarb infusion.  - Trend chemistry - give lasix to improve urine output and for volume overload  EtOH hepatitis/cirrhosis/acute liver failure -was on prednisone but this has been DC'd.  - Follow LFTs  Hyperglycemia - in setting of steroids - SSI  Best Practice (right click and "Reselect all SmartList Selections" daily)   Diet/type: NPO DVT prophylaxis SCD Pressure ulcer(s): N/A GI prophylaxis: PPI Lines: N/A Foley:  N/A Code Status:  DNR Last date of multidisciplinary goals of care discussion [Brother updated at bedside today]  Labs   CBC: Recent Labs  Lab 08/26/23 0709 08/26/23 0954 08/26/23 1316 08/26/23 1732 08/26/23 2030 08/27/23 0101  WBC 14.6* 14.4* 10.7*  --  15.9* 13.5*  NEUTROABS 11.8*  --   --   --   --   --   HGB 9.8* 9.3* 4.9* 10.8* 10.0* 10.1*  HCT 27.9* 26.3* 14.5* 31.6* 29.0* 28.0*  MCV 87.5 86.8 90.1  --  87.6 83.8  PLT 111* 102* 74*  --  116* 119*    Basic Metabolic Panel: Recent Labs  Lab 08/23/23 0822 08/23/23 1500 08/23/23 2314 08/24/23 0609 08/26/23 0709 08/27/23 0101  NA 140 140  --  135 142 138  K 3.4* 3.1* 3.7 3.9 4.0 3.7  CL 113* 112*  --  111 115* 109  CO2 20* 18*  --  15* 15* 16*  GLUCOSE 118* 112*  --  101* 120* 259*  BUN 34* 33*  --  28* 41* 37*  CREATININE 0.90 0.68  --  0.77 1.13 1.32*  CALCIUM 9.5 9.2  --  8.8* 8.4* 7.3*  MG  --  1.8  --   --  1.7 1.5*  PHOS  --  3.9  --   --   --  3.9   GFR: Estimated Creatinine Clearance: 45.2 mL/min (A) (by C-G formula based on SCr of 1.32 mg/dL (H)). Recent Labs  Lab 08/26/23 0937 08/26/23 0954 08/26/23 1316 08/26/23 2030 08/27/23 0101  WBC  --  14.4* 10.7* 15.9* 13.5*  LATICACIDVEN 2.0*  --   --   --   --     Liver Function Tests: Recent Labs  Lab  08/23/23 0822 08/24/23 0609 08/26/23 0709 08/27/23 0101  AST 106* 133* 130* 267*  ALT 111* 112* 100* 127*  ALKPHOS 91 74 60 47  BILITOT 17.1* 16.3* 17.8* 14.7*  PROT 5.6* 5.1* 4.2* 3.7*  ALBUMIN  2.2* 2.0* 1.7* 1.6*   No results for input(s): "LIPASE", "AMYLASE" in the last 168 hours. Recent Labs  Lab 08/23/23 1500 08/26/23 0937  AMMONIA 34 90*    ABG    Component Value Date/Time   PHART 7.3 (L) 08/26/2023 1624   PCO2ART 23 (L) 08/26/2023 1624   PO2ART 325 (H) 08/26/2023 1624   HCO3 11.7 (L) 08/26/2023 1624   TCO2 31 03/20/2007 1527   ACIDBASEDEF 13.2 (H) 08/26/2023 1624   O2SAT 100 08/26/2023 1624     Coagulation Profile: Recent Labs  Lab 08/23/23 0823 08/26/23 0405 08/26/23 1316 08/27/23 0101  INR 2.0* 2.3* 2.9* 2.5*    Cardiac Enzymes: No results for input(s): "CKTOTAL", "CKMB", "CKMBINDEX", "TROPONINI" in the last 168 hours.  HbA1C: Hgb A1c MFr Bld  Date/Time Value Ref Range Status  08/10/2023 09:56 PM 4.7 (L) 4.8 - 5.6 % Final    Comment:    (NOTE) Pre diabetes:          5.7%-6.4%  Diabetes:              >6.4%  Glycemic control for   <7.0% adults with diabetes   08/08/2023 04:10 PM QNSTST 4.8 - 5.6 % Final    Comment:    (NOTE) Test not performed. Insufficient specimen to perform or complete analysis. Montie Apley notified 08/10/2023-[place]         Prediabetes: 5.7 - 6.4         Diabetes: >6.4         Glycemic control for adults with diabetes: <7.0     CBG: Recent Labs  Lab 08/26/23 0808 08/26/23 1937 08/26/23 2347 08/27/23 0506  GLUCAP 104* 237* 244* 234*      Critical care time:  40 minutes      Duaine German, MD [facility] Office: [phone]   See Amion for personal pager PCCM on call pager [phone] until 7pm. Please call Elink 7p-7a. [phone]

## 2023-08-27 NOTE — Plan of Care (Signed)
  Problem: Clinical Measurements: Goal: Cardiovascular complication will be avoided Outcome: Progressing   Problem: Coping: Goal: Level of anxiety will decrease Outcome: Progressing   Problem: Clinical Measurements: Goal: Respiratory complications will improve Outcome: Not Progressing   Problem: Activity: Goal: Risk for activity intolerance will decrease Outcome: Not Progressing   Problem: Nutrition: Goal: Adequate nutrition will be maintained Outcome: Not Progressing   Problem: Safety: Goal: Ability to remain free from injury will improve Outcome: Not Progressing   Problem: Skin Integrity: Goal: Risk for impaired skin integrity will decrease Outcome: Not Progressing

## 2023-08-27 NOTE — Progress Notes (Signed)
 [facility] Progress Note  Kevin Velez 73 y.o. [birth year]  CC: GI bleed, duodenal ulcer, cirrhosis   Subjective: Patient seen and examined at bedside in the ICU.  He is currently intubated.  Discussed with RN at bedside.  He has been having intermittent melanotic stools.  ROS : Not able to obtain   Objective: Vital signs in last 24 hours: Vitals:   08/27/23 0700 08/27/23 0715  BP: (!) 146/82   Pulse: 86 85  Resp: (!) 21 18  Temp:    SpO2: 99% 98%    Physical Exam: Currently intubated and sedated, abdominal exam benign.  Bowel sound present.  Lab Results: Recent Labs    08/26/23 0709 08/27/23 0101  NA 142 138  K 4.0 3.7  CL 115* 109  CO2 15* 16*  GLUCOSE 120* 259*  BUN 41* 37*  CREATININE 1.13 1.32*  CALCIUM 8.4* 7.3*  MG 1.7 1.5*  PHOS  --  3.9   Recent Labs    08/26/23 0709 08/27/23 0101  AST 130* 267*  ALT 100* 127*  ALKPHOS 60 47  BILITOT 17.8* 14.7*  PROT 4.2* 3.7*  ALBUMIN  1.7* 1.6*   Recent Labs    08/26/23 0709 08/26/23 0954 08/26/23 2030 08/27/23 0101  WBC 14.6*   < > 15.9* 13.5*  NEUTROABS 11.8*  --   --   --   HGB 9.8*   < > 10.0* 10.1*  HCT 27.9*   < > 29.0* 28.0*  MCV 87.5   < > 87.6 83.8  PLT 111*   < > 116* 119*   < > = values in this interval not displayed.   Recent Labs    08/26/23 1316 08/27/23 0101  LABPROT 30.9* 27.0*  INR 2.9* 2.5*      Assessment/Plan: -Recurrent GI bleed from large duodenal ulcer.  Status post endoscopic treatment on August 14, 2023 followed by IR guided embolization of GDA on Aug 23, 2023.  EGD yesterday on Aug 26, 2023 showed large blood clot in the duodenum which could not be washed off and fresh blood in the duodenum. CT angio yesterday showed no significant active bleeding but showed possible tiny source of enhancement in the proximal duodenum which could be an aberrant vessel that became apparent after GDA embolization.  - Decompensated cirrhosis with coagulopathy.  INR of 2.5  today.  Recommendations ------------------------ - He likely has a slow oozing from ulcer bed.  He is already status post endoscopic treatment and IR embolization of GDA. - Recommend to continue supportive care with IV Protonix  40 mg twice daily. - If possible avoid steroids and NSAIDs. - Recommend continued daily IV vitamin K to bring INR less than 2 - Transfuse as needed to keep hemoglobin more than 7. - GI will follow.  Discussed with RN at bedside.  Felecia Hopper MD, FACP 08/27/2023, 8:00 AM  Contact #  [phone]

## 2023-08-28 DIAGNOSIS — K922 Gastrointestinal hemorrhage, unspecified: Secondary | ICD-10-CM | POA: Diagnosis not present

## 2023-08-28 DIAGNOSIS — D696 Thrombocytopenia, unspecified: Secondary | ICD-10-CM | POA: Diagnosis not present

## 2023-08-28 DIAGNOSIS — G9341 Metabolic encephalopathy: Secondary | ICD-10-CM | POA: Diagnosis not present

## 2023-08-28 DIAGNOSIS — R571 Hypovolemic shock: Secondary | ICD-10-CM | POA: Diagnosis not present

## 2023-08-28 LAB — CBC WITH DIFFERENTIAL/PLATELET
Abs Immature Granulocytes: 0.09 10*3/uL — ABNORMAL HIGH (ref 0.00–0.07)
Basophils Absolute: 0 10*3/uL (ref 0.0–0.1)
Basophils Relative: 0 %
Eosinophils Absolute: 0 10*3/uL (ref 0.0–0.5)
Eosinophils Relative: 0 %
HCT: 23.9 % — ABNORMAL LOW (ref 39.0–52.0)
Hemoglobin: 8.5 g/dL — ABNORMAL LOW (ref 13.0–17.0)
Immature Granulocytes: 1 %
Lymphocytes Relative: 7 %
Lymphs Abs: 0.8 10*3/uL (ref 0.7–4.0)
MCH: 30.9 pg (ref 26.0–34.0)
MCHC: 35.6 g/dL (ref 30.0–36.0)
MCV: 86.9 fL (ref 80.0–100.0)
Monocytes Absolute: 0.7 10*3/uL (ref 0.1–1.0)
Monocytes Relative: 7 %
Neutro Abs: 9.5 10*3/uL — ABNORMAL HIGH (ref 1.7–7.7)
Neutrophils Relative %: 85 %
Platelets: 93 10*3/uL — ABNORMAL LOW (ref 150–400)
RBC: 2.75 MIL/uL — ABNORMAL LOW (ref 4.22–5.81)
RDW: 15.5 % (ref 11.5–15.5)
WBC: 11.2 10*3/uL — ABNORMAL HIGH (ref 4.0–10.5)
nRBC: 0 % (ref 0.0–0.2)

## 2023-08-28 LAB — GLUCOSE, CAPILLARY
Glucose-Capillary: 109 mg/dL — ABNORMAL HIGH (ref 70–99)
Glucose-Capillary: 111 mg/dL — ABNORMAL HIGH (ref 70–99)
Glucose-Capillary: 121 mg/dL — ABNORMAL HIGH (ref 70–99)
Glucose-Capillary: 128 mg/dL — ABNORMAL HIGH (ref 70–99)
Glucose-Capillary: 137 mg/dL — ABNORMAL HIGH (ref 70–99)
Glucose-Capillary: 162 mg/dL — ABNORMAL HIGH (ref 70–99)

## 2023-08-28 LAB — PREPARE RBC (CROSSMATCH)

## 2023-08-28 LAB — COMPREHENSIVE METABOLIC PANEL WITH GFR
ALT: 96 U/L — ABNORMAL HIGH (ref 0–44)
AST: 152 U/L — ABNORMAL HIGH (ref 15–41)
Albumin: 2.9 g/dL — ABNORMAL LOW (ref 3.5–5.0)
Alkaline Phosphatase: 44 U/L (ref 38–126)
Anion gap: 13 (ref 5–15)
BUN: 37 mg/dL — ABNORMAL HIGH (ref 8–23)
CO2: 22 mmol/L (ref 22–32)
Calcium: 7.8 mg/dL — ABNORMAL LOW (ref 8.9–10.3)
Chloride: 104 mmol/L (ref 98–111)
Creatinine, Ser: 1.27 mg/dL — ABNORMAL HIGH (ref 0.61–1.24)
GFR, Estimated: >60 mL/min (ref >60)
Glucose, Bld: 151 mg/dL — ABNORMAL HIGH (ref 70–99)
Potassium: 2.4 mmol/L — CL (ref 3.5–5.1)
Sodium: 139 mmol/L (ref 135–145)
Total Bilirubin: 14.8 mg/dL — ABNORMAL HIGH (ref 0.0–1.2)
Total Protein: 4.5 g/dL — ABNORMAL LOW (ref 6.5–8.1)

## 2023-08-28 LAB — CBC
HCT: 19.5 % — ABNORMAL LOW (ref 39.0–52.0)
Hemoglobin: 7.2 g/dL — ABNORMAL LOW (ref 13.0–17.0)
MCH: 31.3 pg (ref 26.0–34.0)
MCHC: 36.9 g/dL — ABNORMAL HIGH (ref 30.0–36.0)
MCV: 84.8 fL (ref 80.0–100.0)
Platelets: 60 10*3/uL — ABNORMAL LOW (ref 150–400)
RBC: 2.3 MIL/uL — ABNORMAL LOW (ref 4.22–5.81)
RDW: 15.7 % — ABNORMAL HIGH (ref 11.5–15.5)
WBC: 9.9 10*3/uL (ref 4.0–10.5)
nRBC: 0 % (ref 0.0–0.2)

## 2023-08-28 LAB — PROTIME-INR
INR: 2.8 — ABNORMAL HIGH (ref 0.8–1.2)
Prothrombin Time: 29.6 s — ABNORMAL HIGH (ref 11.4–15.2)

## 2023-08-28 LAB — MAGNESIUM: Magnesium: 1.5 mg/dL — ABNORMAL LOW (ref 1.7–2.4)

## 2023-08-28 MED ORDER — POTASSIUM CHLORIDE 10 MEQ/50ML IV SOLN
10.0000 meq | INTRAVENOUS | Status: AC
  Administered 2023-08-28 (×6): 10 meq via INTRAVENOUS
  Filled 2023-08-28 (×6): qty 50

## 2023-08-28 MED ORDER — SODIUM CHLORIDE 0.9% IV SOLUTION: Freq: Once | INTRAVENOUS | Status: AC

## 2023-08-28 MED ORDER — MAGNESIUM SULFATE 2 GM/50ML IV SOLN
2.0000 g | Freq: Once | INTRAVENOUS | Status: AC
  Administered 2023-08-28: 2 g via INTRAVENOUS
  Filled 2023-08-28: qty 50

## 2023-08-28 MED ORDER — POTASSIUM CHLORIDE 20 MEQ PO PACK
40.0000 meq | PACK | ORAL | Status: AC
  Administered 2023-08-28 (×2): 40 meq
  Filled 2023-08-28 (×2): qty 2

## 2023-08-28 MED ORDER — VITAL HIGH PROTEIN PO LIQD
1000.0000 mL | ORAL | Status: DC
  Administered 2023-08-29: 1000 mL

## 2023-08-28 NOTE — Plan of Care (Signed)
  Problem: Clinical Measurements: Goal: Ability to maintain clinical measurements within normal limits will improve Outcome: Progressing Goal: Will remain free from infection Outcome: Progressing Goal: Diagnostic test results will improve Outcome: Progressing Goal: Respiratory complications will improve Outcome: Progressing Goal: Cardiovascular complication will be avoided Outcome: Progressing   Problem: Coping: Goal: Level of anxiety will decrease Outcome: Progressing   Problem: Nutrition: Goal: Adequate nutrition will be maintained Outcome: Progressing

## 2023-08-28 NOTE — Progress Notes (Signed)
 [facility] Progress Note  Kevin Velez 73 y.o. 06/10/1950  CC: GI bleed, duodenal ulcer, cirrhosis   Subjective: Patient seen and examined at bedside in the ICU.  Remains intubated.  Discussed with RN at bedside.  Continues to have intermittent bleeding but it has slowed down compared to yesterday.  ROS : Not able to obtain   Objective: Vital signs in last 24 hours: Vitals:   08/28/23 0730 08/28/23 0900  BP:  (!) 103/58  Pulse: 73 89  Resp: 18 (!) 0  Temp: (!) 94.6 F (34.8 C) (!) 97.5 F (36.4 C)  SpO2: 95% 97%    Physical Exam: Currently intubated and sedated, abdominal exam benign.  Bowel sound present.  Lab Results: Recent Labs    08/26/23 0709 08/27/23 0101 08/27/23 0856 08/28/23 0034  NA 142 138 137 139  K 4.0 3.7 3.6 2.4*  CL 115* 109 109 104  CO2 15* 16* 18* 22  GLUCOSE 120* 259* 255* 151*  BUN 41* 37* 39* 37*  CREATININE 1.13 1.32* 1.12 1.27*  CALCIUM 8.4* 7.3* 7.3* 7.8*  MG 1.7 1.5*  --   --   PHOS  --  3.9  --   --    Recent Labs    08/27/23 0856 08/28/23 0034  AST 270* 152*  ALT 150* 96*  ALKPHOS 50 44  BILITOT 14.7* 14.8*  PROT 3.7* 4.5*  ALBUMIN  1.6* 2.9*   Recent Labs    08/26/23 0709 08/26/23 0954 08/27/23 2057 08/28/23 0034  WBC 14.6*   < > 11.0* 9.9  NEUTROABS 11.8*  --   --   --   HGB 9.8*   < > 7.7* 7.2*  HCT 27.9*   < > 21.6* 19.5*  MCV 87.5   < > 86.1 84.8  PLT 111*   < > 70* 60*   < > = values in this interval not displayed.   Recent Labs    08/27/23 0856 08/28/23 0034  LABPROT 27.6* 29.6*  INR 2.5* 2.8*      Assessment/Plan: -Recurrent GI bleed from large duodenal ulcer.  Status post endoscopic treatment on August 14, 2023 followed by IR guided embolization of GDA on Aug 23, 2023.  EGD yesterday on Aug 26, 2023 showed large blood clot in the duodenum which could not be washed off and fresh blood in the duodenum. CT angio yesterday showed no significant active bleeding but showed possible tiny source  of enhancement in the proximal duodenum which could be an aberrant vessel that became apparent after GDA embolization.  - Decompensated cirrhosis with coagulopathy.  INR of 2.8 today.  Recommendations ------------------------ - He likely has a slow oozing from ulcer bed.  Low platelet count and elevated INR is also contributing to ongoing oozing.  He is already status post endoscopic treatment and IR embolization of GDA. -Discussed with RN at bedside.  Continues to have intermittent bleeding but it has slowed down compared to yesterday. - Recommend to continue supportive care with IV Protonix  40 mg twice daily. - If possible avoid steroids and NSAIDs.  Prednisone has been discontinued. - Recommend continued daily IV vitamin K to bring INR less than 2 - Recommend 1 unit of blood transfusion -DC octreotide  - GI will follow.    Felecia Hopper MD, FACP 08/28/2023, 10:55 AM  Contact #  [phone]

## 2023-08-28 NOTE — Progress Notes (Signed)
 NAME:  Kevin Velez, MRN:  [id number], DOB:  [birth year], LOS: 5 ADMISSION DATE:  08/23/2023, CONSULTATION DATE:  08/28/2023  REFERRING MD:  Quin Brush APP, CHIEF COMPLAINT:  GI bleeding, hypotension   History of Present Illness:  73 year old man, group home with a recent admission 4/21 to 5/3 with upper GI bleed and acute liver failure.  He presented with hemoglobin of 3, confusion and encephalopathy and was found to have cirrhosis of the liver.  He underwent paracentesis 4/23 which was negative for SBP he underwent EGD on 4/27 when he developed hematochezia and shock, that showed large duodenal ulcer, visible incident with active bleeding that was treated with epinephrine  injection, cautery numerous small with melanotic stools esophageal varices.  Last transfusion was 4/28 and he was discharged to SNF on 5/3 with bilirubin of 20 He was readmitted 5/6 for  hematochezia with melena , CT angio showed active high volume upper GI bleeding from duodenal bulb region. Hb 8.1,  Platelets 151, INR 2.0, bilirubin was 17.1. He underwent mesenteric angiogram and gastroduodenal artery embolization. He was seen by [facility] He developed recurrent bleeding and hypotension on 5/8 with drop in hemoglobin from 7.3-6.3, transfuse 1 unit PRBC, started on Levophed . GI emergently called, PCCM consulted   Pertinent  Medical History  type 2 diabetes,  hypertension,  alcoholic liver cirrhosis  Significant Hospital Events: Including procedures, antibiotic start and stop dates in addition to other pertinent events   5/6 admit for GI bleeding > Gastroduodenal artery embolization.  5/8 recurrent bleeding, hypotension. To ICU > Pressors > additional blood products.  5/9 EGD: mild portal gastropathy, non-bleeding cratered small duodenal ulcer, clot at bulb of duodenem. Required significant transfusions. On levo/vaso/epi. Code status changed to DNR after discussion with brother. 5/10 vasopressors weaned down, remained  intubated  Interim History / Subjective:   of bloody bowel movement output overnight No acute events overnight Updated brother at bedside Levophed  weaned to  Objective    Blood pressure (!) 91/57, pulse 73, temperature (!) 94.6 F (34.8 C), temperature source Axillary, resp. rate 18, height 5\' 7"  (1.702 m), weight 62.3 kg, SpO2 95%.    Vent Mode: PRVC FiO2 (%):  [40 %] 40 % Set Rate:  [18 bmp] 18 bmp Vt Set:  [250 mL-520 mL] 250 mL PEEP:  [5 cmH20] 5 cmH20 Plateau Pressure:  [5 cmH20-17 cmH20] 17 cmH20   Intake/Output Summary (Last 24 hours) at 08/28/2023 0819 Last data filed at 08/28/2023 0751 Gross per 24 hour  Intake 4176.26 ml  Output 2995 ml  Net 1181.26 ml   Filed Weights   08/23/23 0745 08/23/23 1450  Weight: 50.8 kg 62.3 kg    Examination:  General:  chronically ill male, intubated HENT: [place]/AT, PERRL, no JVD Lungs: Clear bilaterally. No accessory muscle use.  Cardiovascular: RRR, no MRG Abdomen: distended Extremities: No acute deformity or edema.  Neuro: sedated, PERRL  Resolved Hospital Problem list   Non-gap metabolic acidosis  Assessment & Plan:   Inability to protect airway - intubated 5/9 - continue ventilator support - propofol /fentanyl  for sedation - SAT/SBT this morning  Upper GI bleed due to duodenal ulcer, portal gastropathy, and known varices (non-bleeding) - Continue octreotide  and BID PPI - continue carafate   - GI following - Ceftriaxone  for GI/SBP  Hemorrhagic shock - Vasopressors for MAP > 65 and SBP > 90. - trend CBCs - transfuse for hemoglobin 7g/dL or lower  Coagulopathy Thrombocytopenia - INR 2.5 - give daily vitamin k  Acute  metabolic encephalopathy:  - Continue lactulose  - Support hemodynamics as above  AKI - Trend chemistry - hold lasix today  EtOH hepatitis/cirrhosis/acute liver failure -was on prednisone but this has been DC'd.  - Follow LFTs  Nutrition Hyperglycemia - SSI - start trickle tube  feeds  Best Practice (right click and "Reselect all SmartList Selections" daily)   Diet/type: tubefeeds DVT prophylaxis SCD Pressure ulcer(s): N/A GI prophylaxis: PPI Lines: Central line Foley:  Yes, and it is still needed Code Status:  DNR Last date of multidisciplinary goals of care discussion [Brother updated at bedside today]  Labs   CBC: Recent Labs  Lab 08/26/23 0709 08/26/23 0954 08/27/23 0101 08/27/23 0856 08/27/23 1511 08/27/23 2057 08/28/23 0034  WBC 14.6*   < > 13.5* 15.3* 13.4* 11.0* 9.9  NEUTROABS 11.8*  --   --   --   --   --   --   HGB 9.8*   < > 10.1* 9.9* 8.8* 7.7* 7.2*  HCT 27.9*   < > 28.0* 27.7* 24.2* 21.6* 19.5*  MCV 87.5   < > 83.8 85.2 84.0 86.1 84.8  PLT 111*   < > 119* 118*  116* 100* 70* 60*   < > = values in this interval not displayed.    Basic Metabolic Panel: Recent Labs  Lab 08/23/23 1500 08/23/23 2314 08/24/23 0609 08/26/23 0709 08/27/23 0101 08/27/23 0856 08/28/23 0034  NA 140  --  135 142 138 137 139  K 3.1*   < > 3.9 4.0 3.7 3.6 2.4*  CL 112*  --  111 115* 109 109 104  CO2 18*  --  15* 15* 16* 18* 22  GLUCOSE 112*  --  101* 120* 259* 255* 151*  BUN 33*  --  28* 41* 37* 39* 37*  CREATININE 0.68  --  0.77 1.13 1.32* 1.12 1.27*  CALCIUM 9.2  --  8.8* 8.4* 7.3* 7.3* 7.8*  MG 1.8  --   --  1.7 1.5*  --   --   PHOS 3.9  --   --   --  3.9  --   --    < > = values in this interval not displayed.   GFR: Estimated Creatinine Clearance: 47 mL/min (A) (by C-G formula based on SCr of 1.27 mg/dL (H)). Recent Labs  Lab 08/26/23 0937 08/26/23 0954 08/27/23 0856 08/27/23 1511 08/27/23 2057 08/28/23 0034  WBC  --    < > 15.3* 13.4* 11.0* 9.9  LATICACIDVEN 2.0*  --  3.7*  --   --   --    < > = values in this interval not displayed.    Liver Function Tests: Recent Labs  Lab 08/24/23 0609 08/26/23 0709 08/27/23 0101 08/27/23 0856 08/28/23 0034  AST 133* 130* 267* 270* 152*  ALT 112* 100* 127* 150* 96*  ALKPHOS 74 60 47 50  44  BILITOT 16.3* 17.8* 14.7* 14.7* 14.8*  PROT 5.1* 4.2* 3.7* 3.7* 4.5*  ALBUMIN  2.0* 1.7* 1.6* 1.6* 2.9*   No results for input(s): "LIPASE", "AMYLASE" in the last 168 hours. Recent Labs  Lab 08/23/23 1500 08/26/23 0937  AMMONIA 34 90*    ABG    Component Value Date/Time   PHART 7.45 08/27/2023 0841   PCO2ART 29 (L) 08/27/2023 0841   PO2ART 87 08/27/2023 0841   HCO3 20.2 08/27/2023 0841   TCO2 31 03/20/2007 1527   ACIDBASEDEF 2.6 (H) 08/27/2023 0841   O2SAT 98.8 08/27/2023 0841     Coagulation Profile:  Recent Labs  Lab 08/26/23 0405 08/26/23 1316 08/27/23 0101 08/27/23 0856 08/28/23 0034  INR 2.3* 2.9* 2.5* 2.5* 2.8*    Cardiac Enzymes: No results for input(s): "CKTOTAL", "CKMB", "CKMBINDEX", "TROPONINI" in the last 168 hours.  HbA1C: Hgb A1c MFr Bld  Date/Time Value Ref Range Status  08/10/2023 09:56 PM 4.7 (L) 4.8 - 5.6 % Final    Comment:    (NOTE) Pre diabetes:          5.7%-6.4%  Diabetes:              >6.4%  Glycemic control for   <7.0% adults with diabetes   08/08/2023 04:10 PM QNSTST 4.8 - 5.6 % Final    Comment:    (NOTE) Test not performed. Insufficient specimen to perform or complete analysis. Montie Apley notified 08/10/2023-[place]         Prediabetes: 5.7 - 6.4         Diabetes: >6.4         Glycemic control for adults with diabetes: <7.0     CBG: Recent Labs  Lab 08/27/23 1609 08/27/23 2034 08/28/23 0016 08/28/23 0401 08/28/23 0736  GLUCAP 194* 167* 162* 109* 111*      Critical care time:  35 minutes      Duaine German, MD [facility] Office: [phone]   See Amion for personal pager PCCM on call pager [phone] until 7pm. Please call Elink 7p-7a. [phone]

## 2023-08-28 NOTE — Progress Notes (Signed)
 eLink Physician-Brief Progress Note Patient Name: Kevin Velez DOB: 20-Jan-1951 MRN: [id number]   Date of Service  08/28/2023  HPI/Events of Note  Critical K+ 2.4, creat 1.27  eICU Interventions  Kcl PO + IV     Intervention Category Minor Interventions: Electrolytes abnormality - evaluation and management  Morghan Kester 08/28/2023, 2:05 AM

## 2023-08-28 NOTE — Plan of Care (Signed)
  Problem: Education: Goal: Knowledge of General Education information will improve Description: Including pain rating scale, medication(s)/side effects and non-pharmacologic comfort measures Outcome: Not Progressing   Problem: Nutrition: Goal: Adequate nutrition will be maintained Outcome: Not Progressing   Problem: Elimination: Goal: Will not experience complications related to bowel motility Outcome: Not Progressing Goal: Will not experience complications related to urinary retention Outcome: Not Progressing

## 2023-08-29 ENCOUNTER — Inpatient Hospital Stay (HOSPITAL_COMMUNITY)

## 2023-08-29 ENCOUNTER — Encounter (HOSPITAL_COMMUNITY): Payer: Self-pay | Admitting: Gastroenterology

## 2023-08-29 DIAGNOSIS — R571 Hypovolemic shock: Secondary | ICD-10-CM | POA: Diagnosis not present

## 2023-08-29 DIAGNOSIS — R34 Anuria and oliguria: Secondary | ICD-10-CM

## 2023-08-29 DIAGNOSIS — E876 Hypokalemia: Secondary | ICD-10-CM

## 2023-08-29 DIAGNOSIS — K922 Gastrointestinal hemorrhage, unspecified: Secondary | ICD-10-CM | POA: Diagnosis not present

## 2023-08-29 DIAGNOSIS — D62 Acute posthemorrhagic anemia: Secondary | ICD-10-CM | POA: Diagnosis not present

## 2023-08-29 DIAGNOSIS — D689 Coagulation defect, unspecified: Secondary | ICD-10-CM

## 2023-08-29 LAB — PREPARE PLATELET PHERESIS
Unit division: 0
Unit division: 0

## 2023-08-29 LAB — CBC WITH DIFFERENTIAL/PLATELET
Abs Immature Granulocytes: 0.11 10*3/uL — ABNORMAL HIGH (ref 0.00–0.07)
Basophils Absolute: 0 10*3/uL (ref 0.0–0.1)
Basophils Relative: 0 %
Eosinophils Absolute: 0 10*3/uL (ref 0.0–0.5)
Eosinophils Relative: 0 %
HCT: 23 % — ABNORMAL LOW (ref 39.0–52.0)
Hemoglobin: 8.4 g/dL — ABNORMAL LOW (ref 13.0–17.0)
Immature Granulocytes: 1 %
Lymphocytes Relative: 8 %
Lymphs Abs: 1 10*3/uL (ref 0.7–4.0)
MCH: 31.6 pg (ref 26.0–34.0)
MCHC: 36.5 g/dL — ABNORMAL HIGH (ref 30.0–36.0)
MCV: 86.5 fL (ref 80.0–100.0)
Monocytes Absolute: 0.7 10*3/uL (ref 0.1–1.0)
Monocytes Relative: 6 %
Neutro Abs: 10.5 10*3/uL — ABNORMAL HIGH (ref 1.7–7.7)
Neutrophils Relative %: 85 %
Platelets: 116 10*3/uL — ABNORMAL LOW (ref 150–400)
RBC: 2.66 MIL/uL — ABNORMAL LOW (ref 4.22–5.81)
RDW: 15.5 % (ref 11.5–15.5)
WBC: 12.4 10*3/uL — ABNORMAL HIGH (ref 4.0–10.5)
nRBC: 0 % (ref 0.0–0.2)

## 2023-08-29 LAB — TYPE AND SCREEN
ABO/RH(D): O POS
Antibody Screen: NEGATIVE
Unit division: 0
Unit division: 0
Unit division: 0
Unit division: 0
Unit division: 0
Unit division: 0
Unit division: 0
Unit division: 0
Unit division: 0
Unit division: 0

## 2023-08-29 LAB — BPAM PLATELET PHERESIS
Blood Product Expiration Date: 202505122359
Blood Product Expiration Date: 202505132359
ISSUE DATE / TIME: 202505091856
ISSUE DATE / TIME: 202505111402
Unit Type and Rh: 5100
Unit Type and Rh: 5100

## 2023-08-29 LAB — BPAM RBC
Blood Product Expiration Date: 202506022359
Blood Product Expiration Date: 202506032359
Blood Product Expiration Date: 202506032359
Blood Product Expiration Date: 202506072359
Blood Product Expiration Date: 202506072359
Blood Product Expiration Date: 202506072359
Blood Product Expiration Date: 202506072359
Blood Product Expiration Date: 202506082359
Blood Product Expiration Date: 202506102359
ISSUE DATE / TIME: 202505061152
ISSUE DATE / TIME: 202505070024
ISSUE DATE / TIME: 202505082123
ISSUE DATE / TIME: 202505090231
ISSUE DATE / TIME: 202505090404
ISSUE DATE / TIME: 202505091312
ISSUE DATE / TIME: 202505091420
ISSUE DATE / TIME: 202505091457
ISSUE DATE / TIME: 202505101057
ISSUE DATE / TIME: 202506082359
ISSUE DATE / TIME: 202505111135
Unit Type and Rh: 202506072359
Unit Type and Rh: 202506082359
Unit Type and Rh: 5100
Unit Type and Rh: 5100
Unit Type and Rh: 5100
Unit Type and Rh: 5100
Unit Type and Rh: 5100
Unit Type and Rh: 5100
Unit Type and Rh: 5100
Unit Type and Rh: 5100
Unit Type and Rh: 5100
Unit Type and Rh: 5100

## 2023-08-29 LAB — PREPARE FRESH FROZEN PLASMA: Unit division: 0

## 2023-08-29 LAB — GLUCOSE, CAPILLARY
Glucose-Capillary: 107 mg/dL — ABNORMAL HIGH (ref 70–99)
Glucose-Capillary: 109 mg/dL — ABNORMAL HIGH (ref 70–99)
Glucose-Capillary: 112 mg/dL — ABNORMAL HIGH (ref 70–99)
Glucose-Capillary: 116 mg/dL — ABNORMAL HIGH (ref 70–99)
Glucose-Capillary: 121 mg/dL — ABNORMAL HIGH (ref 70–99)
Glucose-Capillary: 126 mg/dL — ABNORMAL HIGH (ref 70–99)
Glucose-Capillary: 129 mg/dL — ABNORMAL HIGH (ref 70–99)

## 2023-08-29 LAB — COMPREHENSIVE METABOLIC PANEL WITH GFR
ALT: 92 U/L — ABNORMAL HIGH (ref 0–44)
AST: 131 U/L — ABNORMAL HIGH (ref 15–41)
Albumin: 2.5 g/dL — ABNORMAL LOW (ref 3.5–5.0)
Alkaline Phosphatase: 61 U/L (ref 38–126)
Anion gap: 13 (ref 5–15)
BUN: 36 mg/dL — ABNORMAL HIGH (ref 8–23)
CO2: 22 mmol/L (ref 22–32)
Calcium: 7.7 mg/dL — ABNORMAL LOW (ref 8.9–10.3)
Chloride: 105 mmol/L (ref 98–111)
Creatinine, Ser: 1.19 mg/dL (ref 0.61–1.24)
GFR, Estimated: >60 mL/min (ref >60)
Glucose, Bld: 121 mg/dL — ABNORMAL HIGH (ref 70–99)
Potassium: 2.5 mmol/L — CL (ref 3.5–5.1)
Sodium: 140 mmol/L (ref 135–145)
Total Bilirubin: 16.2 mg/dL — ABNORMAL HIGH (ref 0.0–1.2)
Total Protein: 4.6 g/dL — ABNORMAL LOW (ref 6.5–8.1)

## 2023-08-29 LAB — POTASSIUM: Potassium: 4.2 mmol/L (ref 3.5–5.1)

## 2023-08-29 LAB — BPAM FFP
Blood Product Expiration Date: 202505142359
Blood Product Expiration Date: 202505121115
ISSUE DATE / TIME: 202505090911
ISSUE DATE / TIME: 202505111810
Unit Type and Rh: 9500
Unit Type and Rh: 5100

## 2023-08-29 LAB — CBC
HCT: 25.3 % — ABNORMAL LOW (ref 39.0–52.0)
Hemoglobin: 8.8 g/dL — ABNORMAL LOW (ref 13.0–17.0)
MCH: 31.2 pg (ref 26.0–34.0)
MCHC: 34.8 g/dL (ref 30.0–36.0)
MCV: 89.7 fL (ref 80.0–100.0)
Platelets: 112 10*3/uL — ABNORMAL LOW (ref 150–400)
RBC: 2.82 MIL/uL — ABNORMAL LOW (ref 4.22–5.81)
RDW: 16.3 % — ABNORMAL HIGH (ref 11.5–15.5)
WBC: 12.1 10*3/uL — ABNORMAL HIGH (ref 4.0–10.5)
nRBC: 0 % (ref 0.0–0.2)

## 2023-08-29 LAB — PROTIME-INR
INR: 2.3 — ABNORMAL HIGH (ref 0.8–1.2)
Prothrombin Time: 25.3 s — ABNORMAL HIGH (ref 11.4–15.2)

## 2023-08-29 LAB — MAGNESIUM: Magnesium: 1.8 mg/dL (ref 1.7–2.4)

## 2023-08-29 LAB — PHOSPHORUS: Phosphorus: 3 mg/dL (ref 2.5–4.6)

## 2023-08-29 MED ORDER — MAGNESIUM SULFATE 2 GM/50ML IV SOLN
2.0000 g | Freq: Once | INTRAVENOUS | Status: AC
  Administered 2023-08-29: 2 g via INTRAVENOUS
  Filled 2023-08-29: qty 50

## 2023-08-29 MED ORDER — SODIUM CHLORIDE 0.9 % IV SOLN: INTRAVENOUS | Status: AC | PRN

## 2023-08-29 MED ORDER — POTASSIUM CHLORIDE 20 MEQ PO PACK
40.0000 meq | PACK | Freq: Two times a day (BID) | ORAL | Status: AC
  Administered 2023-08-29: 40 meq
  Filled 2023-08-29: qty 2

## 2023-08-29 MED ORDER — DEXMEDETOMIDINE HCL IN NACL 200 MCG/50ML IV SOLN
INTRAVENOUS | Status: AC
  Administered 2023-08-29: 0.4 ug/kg/h via INTRAVENOUS
  Filled 2023-08-29: qty 50

## 2023-08-29 MED ORDER — VITAMIN K1 10 MG/ML IJ SOLN: 10.0000 mg | Freq: Once | INTRAVENOUS | Status: DC

## 2023-08-29 MED ORDER — ALBUMIN HUMAN 5 % IV SOLN
25.0000 g | Freq: Once | INTRAVENOUS | Status: AC
  Administered 2023-08-29: 25 g via INTRAVENOUS
  Filled 2023-08-29: qty 500

## 2023-08-29 MED ORDER — MIDAZOLAM HCL 2 MG/2ML IJ SOLN
1.0000 mg | INTRAMUSCULAR | Status: DC | PRN
  Administered 2023-08-30: 1 mg via INTRAVENOUS
  Filled 2023-08-29: qty 2

## 2023-08-29 MED ORDER — POTASSIUM CHLORIDE 10 MEQ/50ML IV SOLN
10.0000 meq | INTRAVENOUS | Status: AC
Start: 2023-08-29 — End: 2023-08-29
  Administered 2023-08-29 (×6): 10 meq via INTRAVENOUS
  Filled 2023-08-29 (×6): qty 50

## 2023-08-29 MED ORDER — SODIUM CHLORIDE 0.9 % IV SOLN
2.0000 g | INTRAVENOUS | Status: DC
  Administered 2023-08-30 – 2023-08-31 (×2): 2 g via INTRAVENOUS
  Filled 2023-08-29 (×2): qty 20

## 2023-08-29 MED ORDER — TRANEXAMIC ACID-NACL 1000-0.7 MG/100ML-% IV SOLN
1000.0000 mg | INTRAVENOUS | Status: DC
  Filled 2023-08-29: qty 100

## 2023-08-29 MED ORDER — FENTANYL CITRATE PF 50 MCG/ML IJ SOSY
25.0000 ug | PREFILLED_SYRINGE | INTRAMUSCULAR | Status: DC | PRN
  Administered 2023-08-29: 25 ug via INTRAVENOUS
  Filled 2023-08-29: qty 1

## 2023-08-29 MED ORDER — TRANEXAMIC ACID-NACL 1000-0.7 MG/100ML-% IV SOLN
1000.0000 mg | Freq: Once | INTRAVENOUS | Status: AC
  Administered 2023-08-29: 1000 mg via INTRAVENOUS
  Filled 2023-08-29: qty 100

## 2023-08-29 MED ORDER — POTASSIUM CHLORIDE 20 MEQ PO PACK
40.0000 meq | PACK | Freq: Once | ORAL | Status: AC
  Administered 2023-08-29: 40 meq
  Filled 2023-08-29: qty 2

## 2023-08-29 MED ORDER — CALCIUM GLUCONATE-NACL 1-0.675 GM/50ML-% IV SOLN
1.0000 g | Freq: Once | INTRAVENOUS | Status: AC
Start: 2023-08-29 — End: 2023-08-29
  Administered 2023-08-29: 1000 mg via INTRAVENOUS
  Filled 2023-08-29: qty 50

## 2023-08-29 MED ORDER — FENTANYL CITRATE PF 50 MCG/ML IJ SOSY: 25.0000 ug | PREFILLED_SYRINGE | INTRAMUSCULAR | Status: DC | PRN

## 2023-08-29 MED ORDER — NOREPINEPHRINE 16 MG/250ML-% IV SOLN
0.0000 ug/min | INTRAVENOUS | Status: DC
Start: 2023-08-29 — End: 2023-08-31
  Administered 2023-08-29: 11 ug/min via INTRAVENOUS
  Administered 2023-08-30: 10 ug/min via INTRAVENOUS
  Filled 2023-08-29 (×2): qty 250

## 2023-08-29 MED ORDER — DEXMEDETOMIDINE HCL IN NACL 400 MCG/100ML IV SOLN
0.0000 ug/kg/h | INTRAVENOUS | Status: DC
  Administered 2023-08-29 – 2023-08-30 (×2): 0.4 ug/kg/h via INTRAVENOUS
  Filled 2023-08-29 (×2): qty 100

## 2023-08-29 NOTE — TOC Initial Note (Addendum)
 Transition of Care [facility]) - Initial/Assessment Note    Patient Details  Name: Kevin Velez MRN: [id number] Date of Birth: 01-24-1951  [facility]) CM/SW Contact:    Kevin Corolla, RN Phone Number: 08/29/2023, 12:09 PM  Clinical Narrative: From [facility] d/c plan to return spoke to Kevin Velez(brother), & [facility] rep Kevin Velez.Intubated currently. Await closer to medical stability prior auth.                       Expected Discharge Plan: Skilled Nursing Facility Barriers to Discharge: Continued Medical Work up   Patient Goals and CMS Choice Patient states their goals for this hospitalization and ongoing recovery are:: Rehab CMS Medicare.gov Compare Post Acute Care list provided to:: Patient Represenative (must comment) (Kevin Velez(Brother)) Choice offered to / list presented to : Sibling Kevin Velez ownership interest in [facility].provided to:: Sibling    Expected Discharge Plan and Services In-house Referral: NA   Post Acute Care Choice: Skilled Nursing Facility Living arrangements for the past 2 months: Skilled Nursing Facility Kevin Velez)                 DME Arranged: N/A DME Agency: NA       HH Arranged: NA HH Agency: NA        Prior Living Arrangements/Services Living arrangements for the past 2 months: Skilled Nursing Facility Psychologist, counselling) Lives with:: Facility Resident Patient language and need for interpreter reviewed:: Yes Do you feel safe going back to the place where you live?: Yes      Need for Family Participation in Patient Care: Yes (Comment) Care giver support system in place?: Yes (comment)   Criminal Activity/Legal Involvement Pertinent to Current Situation/Hospitalization: No - Comment as needed  Activities of Daily Living   ADL Screening (condition at time of admission) Independently performs ADLs?: No Does the patient have a NEW difficulty with bathing/dressing/toileting/self-feeding that is expected to last >3  days?: Yes (Initiates electronic notice to provider for possible OT consult) Does the patient have a NEW difficulty with getting in/out of bed, walking, or climbing stairs that is expected to last >3 days?: Yes (Initiates electronic notice to provider for possible PT consult) Does the patient have a NEW difficulty with communication that is expected to last >3 days?: Yes (Initiates electronic notice to provider for possible SLP consult) Is the patient deaf or have difficulty hearing?: No Does the patient have difficulty seeing, even when wearing glasses/contacts?: Yes Does the patient have difficulty concentrating, remembering, or making decisions?: Yes  Permission Sought/Granted                  Emotional Assessment Appearance:: Appears stated age     Orientation: : Oriented to Self, Oriented to Place, Oriented to  Time, Oriented to Situation Alcohol / Substance Use: Not Applicable Psych Involvement: No (comment)  Admission diagnosis:  Melena [K92.1] Acute lower GI bleeding [K92.2] Acute GI bleeding [K92.2] 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 [place] [address] Phone: [phone] Fax: [phone]     Social Drivers of Health (SDOH) Social History: SDOH Screenings   Food Insecurity: No Food Insecurity (08/23/2023)  Housing: Low Risk  (08/23/2023)   Transportation Needs: No Transportation Needs (08/23/2023)  Utilities: Not At Risk (08/23/2023)  Social Connections: Socially Isolated (08/23/2023)  Tobacco Use: High Risk (08/26/2023)   SDOH Interventions:     Readmission Risk Interventions    08/24/2023   10:39 AM 08/11/2023    2:18 PM  Readmission Risk Prevention Plan  Transportation Screening Complete Complete  PCP or Specialist Appt within 3-5 Days Complete   HRI or Home Care Consult Complete Complete  Social Work Consult for Recovery Care Planning/Counseling Complete Complete  Palliative Care Screening Not Applicable Not Applicable  Medication Review Oceanographer) Complete Complete

## 2023-08-29 NOTE — Progress Notes (Signed)
   08/29/23 1318  Spiritual Encounters  Type of Visit Initial  Care provided to: Pt and family  Referral source Family  Reason for visit Religious ritual  Spiritual Framework  Presenting Themes Impactful experiences and emotions;Rituals and practive   Per page from their nurse conveying family request, I visited with Mr. Kevin Velez and his family. At bedside was a brother and sister, joined eventually by a nephew. The family shared numerous losses in the family that have been particularly impactful to Kevin Velez. Notably, a death last month of someone who was "like a brother" and whose memorial Kevin Velez was able to attended, thus conveying the stark decline he has experienced.  I provided compassionate presence and active listening for the family. I encouraged their self- care, particularly as Kevin Velez brother has a spouse receiving care. I also hope to advocate for family who indicate desire to be engaged in care decisions since Kevin Velez has no spouse or surviving children. Offered prayer at family request.  Will continue to follow.

## 2023-08-29 NOTE — Plan of Care (Signed)
  Problem: Education: Goal: Knowledge of General Education information will improve Description: Including pain rating scale, medication(s)/side effects and non-pharmacologic comfort measures Outcome: Not Progressing   Problem: Elimination: Goal: Will not experience complications related to bowel motility Outcome: Not Progressing Goal: Will not experience complications related to urinary retention Outcome: Not Progressing   Problem: Pain Managment: Goal: General experience of comfort will improve and/or be controlled Outcome: Not Progressing   Problem: Activity: Goal: Ability to return to baseline activity level will improve Outcome: Not Progressing

## 2023-08-29 NOTE — Progress Notes (Signed)
 Initial Nutrition Assessment  DOCUMENTATION CODES:   Severe malnutrition in context of chronic illness  INTERVENTION:  - Per CCM, trickle tube feeds only today via OGT:  Vital HP @ 63mL/hr - provides 480 kcals and 42g protein  - Monitor magnesium , potassium, and phosphorus BID for at least 3 days, MD to replete as needed, as pt is at risk for refeeding syndrome given severe malnutrition.   - Once able to advance past trickles, recommend: Vital 1.2 at 55 ml/h (1320 ml per day) *Would recommend increasing by 10mL Q12H Provides 1584 kcal, 99 gm protein, 1070 ml free water  daily  - Monitor weight trends.   NUTRITION DIAGNOSIS:   Severe Malnutrition related to chronic illness as evidenced by severe fat depletion, severe muscle depletion.  GOAL:   Patient will meet greater than or equal to 90% of their needs  MONITOR:   Vent status, Labs, Weight trends, TF tolerance  REASON FOR ASSESSMENT:   Ventilator    ASSESSMENT:   73 y.o. male from a group home with a recent admission 4/21 to 5/3 with upper GI bleed and acute liver failure. Was discharged to SNF on 5/3 but readmitted 5/6 for hematochezia with melena with CT angio showed active high volume upper GI bleeding from duodenal bulb region.    5/6 Admit; Gastroduodenal artery embolization  5/8 Hypotension, transferred to ICU and pressors added 5/9 EGD; Intubated, OGT placed 5/12 Trickle TF's initiated   Patient is currently intubated on ventilator support MV: 9.4 L/min Temp (24hrs), Avg:96.8 F (36 C), Min:95.2 F (35.1 C), Max:97.7 F (36.5 C)  No family/visitors at bedside at time of visit.   TF's ordered to start yesterday but never started due to increased bleeding. RN at bedside reports CCM wanting to start tube feeds today. Plan for only trickle tube feeds for now.  OGT in stomach.  Patient known to this RD from recent admission. Current weight much higher than weights over the past 1 month, suspect due to deep  pitting edema (generalized and upper and lower extremities).  Will provide goal TF recs for once able to advance. Suspect patient has continued to lose weight and remains at risk of refeeding syndrome.   Medications reviewed and include: Lactulose  TID, Vitamin K Levophed  @ 13 mcg/min  Labs reviewed:  K+ 2.5   NUTRITION - FOCUSED PHYSICAL EXAM:  Flowsheet Row Most Recent Value  Orbital Region Severe depletion  Upper Arm Region Severe depletion  Thoracic and Lumbar Region Severe depletion  Buccal Region Unable to assess  Temple Region Severe depletion  Clavicle Bone Region Severe depletion  Clavicle and Acromion Bone Region Severe depletion  Scapular Bone Region Unable to assess  Dorsal Hand Unable to assess  Patellar Region Unable to assess  [edema]  Anterior Thigh Region Unable to assess  [edema]  Posterior Calf Region Unable to assess  [edema]  Edema (RD Assessment) Moderate  Hair Reviewed  Eyes Unable to assess  Mouth Unable to assess  Skin Reviewed  Nails Reviewed       Diet Order:   Diet Order             Diet NPO time specified  Diet effective now                   EDUCATION NEEDS:  Not appropriate for education at this time  Skin:  Skin Assessment: Skin Integrity Issues: Skin Integrity Issues:: Stage II Stage II: Left Buttocks  Last BM:  5/12 - type 7 -  rectal tube  Height:  Ht Readings from Last 1 Encounters:  08/23/23 5\' 7"  (1.702 m)   Weight:  Wt Readings from Last 1 Encounters:  08/29/23 72.1 kg    BMI:  Body mass index is 24.9 kg/m.  Estimated Nutritional Needs:  Kcal:  1500-1700 kcals Protein:  85-100 grams Fluid:  >/= 1.5L    Scheryl Cushing RD, LDN Contact via Secure Chat.

## 2023-08-29 NOTE — Progress Notes (Signed)
 NAME:  Kevin Velez, MRN:  [id number], DOB:  1951-03-29, LOS: 6 ADMISSION DATE:  08/23/2023, CONSULTATION DATE:  08/29/2023  REFERRING MD:  Quin Brush APP, CHIEF COMPLAINT:  GI bleeding, hypotension   History of Present Illness:  73 year old man, group home with a recent admission 4/21 to 5/3 with upper GI bleed and acute liver failure.  He presented with hemoglobin of 3, confusion and encephalopathy and was found to have cirrhosis of the liver.  He underwent paracentesis 4/23 which was negative for SBP he underwent EGD on 4/27 when he developed hematochezia and shock, that showed large duodenal ulcer, visible incident with active bleeding that was treated with epinephrine  injection, cautery numerous small with melanotic stools esophageal varices.  Last transfusion was 4/28 and he was discharged to SNF on 5/3 with bilirubin of 20 He was readmitted 5/6 for  hematochezia with melena , CT angio showed active high volume upper GI bleeding from duodenal bulb region. Hb 8.1,  Platelets 151, INR 2.0, bilirubin was 17.1. He underwent mesenteric angiogram and gastroduodenal artery embolization. He was seen by [facility] He developed recurrent bleeding and hypotension on 5/8 with drop in hemoglobin from 7.3-6.3, transfuse 1 unit PRBC, started on Levophed . GI emergently called, PCCM consulted   Pertinent  Medical History  type 2 diabetes,  hypertension,  alcoholic liver cirrhosis  Significant Hospital Events: Including procedures, antibiotic start and stop dates in addition to other pertinent events   5/6 admit for GI bleeding > Gastroduodenal artery embolization.  5/8 recurrent bleeding, hypotension. To ICU > Pressors > additional blood products.  5/9 EGD: mild portal gastropathy, non-bleeding cratered small duodenal ulcer, clot at bulb of duodenem. Required significant transfusions. On levo/vaso/epi. Code status changed to DNR after discussion with brother. 5/10 vasopressors weaned down, remained  intubated 5/11 of bloody bowel movement output overnight, still on pressors.  GI felt he was still likely losing some blood and that coagulopathy could be contributing.  Recommended continuing PPI twice daily, and avoiding steroids as well as nonsteroidals, also recommended keeping INR less than 2 5/12 hemoglobin has been stable, still having frank dark blood from rectal tube.  Still on pressors, but seemingly at least some degree due to sedation  Interim History / Subjective:   Sedated on mechanical ventilator currently heavily sedated requiring painful stimulus for response  Objective    Blood pressure 107/86, pulse 73, temperature (!) 96.6 F (35.9 C), resp. rate 18, height 5\' 7"  (1.702 m), weight 72.1 kg, SpO2 98%.    Vent Mode: PRVC FiO2 (%):  [40 %] 40 % Set Rate:  [18 bmp] 18 bmp Vt Set:  [520 mL] 520 mL PEEP:  [5 cmH20] 5 cmH20 Plateau Pressure:  [14 cmH20-18 cmH20] 17 cmH20   Intake/Output Summary (Last 24 hours) at 08/29/2023 0945 Last data filed at 08/29/2023 0800 Gross per 24 hour  Intake 2554.23 ml  Output 1515 ml  Net 1039.23 ml   Filed Weights   08/23/23 0745 08/23/23 1450 08/29/23 0417  Weight: 50.8 kg 62.3 kg 72.1 kg    Examination:  General Chronically ill-appearing 73 year old male patient currently laying in bed sedated on both propofol  and fentanyl  HEENT temporal wasting mucous membranes dry no JVD sclera are icteric Pulmonary diminished bases currently minimal ventilator settings, FiO2 40%, PEEP 5. Portable chest x-ray personally reviewed: This shows the endotracheal tube at the inlet of the right mainstem, there is some degree of bilateral volume loss consistent with some atelectasis on the right, and possibly  some degree of left effusion Cardiac regular rate and rhythm without murmur rub or gallop Abdomen soft, rectal tube in place still with gross dark bloody liquid output Extremities dependent edema warm Neuro heavily sedated, withdraws to pain  pupils equal reactive GU clear yellow  Resolved Hospital Problem list     Non-gap metabolic acidosis Hemorrhagic shock Assessment & Plan:   Recurrent acute upper GI bleed due to duodenal ulcer, portal gastropathy, and known varices (non-bleeding) Still oozing blood from rectal tube however hemoglobin has been stable overnight hovering around 8.4-8.5  plan Continuing twice daily PPI Continue Carafate  Day 3 of planned 5 CTX for GI and SBP  Ensure that his INR is less than 2, repeating vitamin K replacement Ensure platelet count greater than 50K Trigger for transfusion is hemoglobin less than 7 or evidence of active bleeding with hemodynamic instability  Circulatory shock.  This was initially hemorrhagic in nature but hemoglobin stable now.  May be slowly oozing but doubt hemorrhage or accounts for pressor requirements.  Suspect may be element of sedation related hypotension at this point Plan Ensure mean arterial pressure greater than 65 Watch for active bleeding Keep euvolemic  Ventilator management, intubated for airway protection 5/9 - Endotracheal tube malpositioned into the right mainstem bronchus, otherwise seems like sedation is the barrier to weaning at this point Plan Changing propofol  to Precedex RASS goal -1 Pull endotracheal tube back 2 cm VAP bundle Assess for spontaneous breathing trial after at goal RASS He may require some degree of diuresis, given his K was 2.5 earlier and he is still on norepinephrine  we will hold off for now  Fluid and electrolyte imbalance: Hypokalemia Plan Replaced earlier, adding additional replacement Follow-up chemistry this afternoon  AKI - Creatinine improving Plan Avoid nephrotoxins Ensure adequate mean arterial pressure Strict intake output A.m. chemistry  Coagulopathy Thrombocytopenia Platelets are better, still a bit coagulopathic Plan Repeating vitamin K INR and CBC in the morning  Acute metabolic encephalopathy, and  hepatic encephalopathy Plan PAD protocol Discontinue propofol , changed to Precedex Lactulose  is ordered, currently having liquid bloody stool holding morning dose  EtOH hepatitis/cirrhosis/acute liver failure -was on prednisone but this has been DC'd.  Plan Follow LFTs  Nutrition Hyperglycemia Plan SSI AC given cirrhosis use sensitive scale, goal 140-180 Advance tube feeds as tolerated  Best Practice (right click and "Reselect all SmartList Selections" daily)   Diet/type: tubefeeds starting trickle feeds 5/12 DVT prophylaxis SCD Pressure ulcer(s): N/A GI prophylaxis: PPI Lines: Central line Foley:  Yes, and it is still needed Code Status:  DNR Last date of multidisciplinary goals of care discussion [Brother updated at bedside today]   Critical care time:  35 minutes

## 2023-08-29 NOTE — Progress Notes (Signed)
 Orthopedic Tech Progress Note Patient Details:  Kevin Velez 11-24-1950 [id number]  Ortho Devices Type of Ortho Device: Radio broadcast assistant Ortho Device/Splint Location: BLE Ortho Device/Splint Interventions: Ordered, Application, Adjustment   Post Interventions Patient Tolerated: Well  Toi Foster 08/29/2023, 3:50 PM

## 2023-08-29 NOTE — Progress Notes (Addendum)
 eLink Physician-Brief Progress Note Patient Name: Kevin Velez DOB: 1950/09/30 MRN: [id number]   Date of Service  08/29/2023  HPI/Events of Note  Severe hypokalemia. H/H stable last two checks.   eICU Interventions  40 meq oral K and 60 meq IV Kcl  Recheck K at 1 pm  Add on mag now  Plz let me know when mag results      Intervention Category Major Interventions: Electrolyte abnormality - evaluation and management  Macedonio Scallon G Teghan Philbin 08/29/2023, 3:24 AM  Addendum at 5 am - Mag is 1.8 I wrote for 2 gram mag x 1

## 2023-08-29 NOTE — Progress Notes (Signed)
 OT Cancellation Note  Patient Details Name: Kevin Velez MRN: [id number] DOB: 1950-12-14   Cancelled Treatment:    Reason Eval/Treat Not Completed: Medical issues which prohibited therapy Patient is not medically ready for therapy at this time per nursing. Please re order OT when patient is medically able to participate in therapy. Thank you  Wynette Heckler, MS Acute Rehabilitation Department Office# [phone]  08/29/2023, 2:28 PM

## 2023-08-29 NOTE — Progress Notes (Addendum)
 See progress note from today

## 2023-08-29 NOTE — Progress Notes (Signed)
 [facility] Progress Note  Kevin Velez 73 y.o. [birth year]  CC: GI bleed, duodenal ulcer, cirrhosis   Subjective: Patient seen and examined at bedside in the ICU.  Remains intubated.  Discussed with RN at bedside.  Continues to have intermittent bleeding.   ROS : Not able to obtain   Objective: Vital signs in last 24 hours: Vitals:   08/29/23 0830 08/29/23 0845  BP:    Pulse: 75 73  Resp: 18 18  Temp: (!) 96.6 F (35.9 C) (!) 96.6 F (35.9 C)  SpO2: 99% 98%    Physical Exam: Currently intubated and sedated, abdominal exam benign.  Bowel sound present.  Lab Results: Recent Labs    08/27/23 0101 08/27/23 0856 08/28/23 0034 08/28/23 1223 08/29/23 0220 08/29/23 0324  NA 138   < > 139  --  140  --   K 3.7   < > 2.4*  --  2.5*  --   CL 109   < > 104  --  105  --   CO2 16*   < > 22  --  22  --   GLUCOSE 259*   < > 151*  --  121*  --   BUN 37*   < > 37*  --  36*  --   CREATININE 1.32*   < > 1.27*  --  1.19  --   CALCIUM 7.3*   < > 7.8*  --  7.7*  --   MG 1.5*  --   --  1.5*  --  1.8  PHOS 3.9  --   --   --   --   --    < > = values in this interval not displayed.   Recent Labs    08/28/23 0034 08/29/23 0220  AST 152* 131*  ALT 96* 92*  ALKPHOS 44 61  BILITOT 14.8* 16.2*  PROT 4.5* 4.6*  ALBUMIN  2.9* 2.5*   Recent Labs    08/28/23 1748 08/29/23 0220  WBC 11.2* 12.4*  NEUTROABS 9.5* 10.5*  HGB 8.5* 8.4*  HCT 23.9* 23.0*  MCV 86.9 86.5  PLT 93* 116*   Recent Labs    08/28/23 0034 08/29/23 0922  LABPROT 29.6* 25.3*  INR 2.8* 2.3*      Assessment/Plan: -Recurrent GI bleed from large duodenal ulcer.  Status post endoscopic treatment on August 14, 2023 followed by IR guided embolization of GDA on Aug 23, 2023.  EGD Aug 26, 2023 showed large blood clot in the duodenum which could not be washed off and fresh blood in the duodenum. CT angio 5/9 showed no significant active bleeding but showed possible tiny source of enhancement in the proximal  duodenum which could be an aberrant vessel that became apparent after GDA embolization.  - Decompensated cirrhosis with coagulopathy.  INR of 2.3 today. MELD 3.0 of 30 as of 5/12 - Acute kidney injury.  Improving - Hypokalemia and electrolyte disturbance.  On supplements. - Unable to protect airway.  Remains intubated.  Recommendations ------------------------ -I had a discussion with his brother over the phone today.  He has ongoing bleeding but hemoglobin is stable.  Not able to come off of ventilation.  Given his decompensated cirrhosis with coagulopathy and elevated MELD score, long-term prognosis is poor.  This was discussed with the patient.  I advised him to have a goal of care discussion with other family members and with the ICU doctors.   - He likely has a slow oozing from ulcer bed.  Low platelet count and elevated INR is also contributing to ongoing oozing.  He is already status post endoscopic treatment and IR embolization of GDA.   - Recommend to continue supportive care with IV Protonix  40 mg twice daily. -Prednisone has been discontinued. - Recommend continued daily IV vitamin K to bring INR less than 2  - GI will follow.    Felecia Hopper MD, FACP 08/29/2023, 10:35 AM  Contact #  [phone]

## 2023-08-30 ENCOUNTER — Inpatient Hospital Stay (HOSPITAL_COMMUNITY)

## 2023-08-30 DIAGNOSIS — K922 Gastrointestinal hemorrhage, unspecified: Secondary | ICD-10-CM | POA: Diagnosis not present

## 2023-08-30 DIAGNOSIS — R34 Anuria and oliguria: Secondary | ICD-10-CM | POA: Diagnosis not present

## 2023-08-30 DIAGNOSIS — D62 Acute posthemorrhagic anemia: Secondary | ICD-10-CM | POA: Diagnosis not present

## 2023-08-30 DIAGNOSIS — R571 Hypovolemic shock: Secondary | ICD-10-CM | POA: Diagnosis not present

## 2023-08-30 LAB — TRIGLYCERIDES: Triglycerides: 211 mg/dL — ABNORMAL HIGH (ref <150)

## 2023-08-30 LAB — CBC
HCT: 21.8 % — ABNORMAL LOW (ref 39.0–52.0)
HCT: 21.4 % — ABNORMAL LOW (ref 39.0–52.0)
Hemoglobin: 7.4 g/dL — ABNORMAL LOW (ref 13.0–17.0)
Hemoglobin: 7.1 g/dL — ABNORMAL LOW (ref 13.0–17.0)
MCH: 31.2 pg (ref 26.0–34.0)
MCH: 31 pg (ref 26.0–34.0)
MCHC: 33.9 g/dL (ref 30.0–36.0)
MCHC: 33.2 g/dL (ref 30.0–36.0)
MCV: 92 fL (ref 80.0–100.0)
MCV: 93.4 fL (ref 80.0–100.0)
Platelets: 60 10*3/uL — ABNORMAL LOW (ref 150–400)
Platelets: 55 10*3/uL — ABNORMAL LOW (ref 150–400)
RBC: 2.37 MIL/uL — ABNORMAL LOW (ref 4.22–5.81)
RBC: 2.29 MIL/uL — ABNORMAL LOW (ref 4.22–5.81)
RDW: 17.4 % — ABNORMAL HIGH (ref 11.5–15.5)
RDW: 17.9 % — ABNORMAL HIGH (ref 11.5–15.5)
WBC: 7.3 10*3/uL (ref 4.0–10.5)
WBC: 8.4 10*3/uL (ref 4.0–10.5)
nRBC: 0 % (ref 0.0–0.2)
nRBC: 0 % (ref 0.0–0.2)

## 2023-08-30 LAB — COMPREHENSIVE METABOLIC PANEL WITH GFR
ALT: 87 U/L — ABNORMAL HIGH (ref 0–44)
AST: 135 U/L — ABNORMAL HIGH (ref 15–41)
Albumin: 2.6 g/dL — ABNORMAL LOW (ref 3.5–5.0)
Alkaline Phosphatase: 63 U/L (ref 38–126)
Anion gap: 10 (ref 5–15)
BUN: 28 mg/dL — ABNORMAL HIGH (ref 8–23)
CO2: 21 mmol/L — ABNORMAL LOW (ref 22–32)
Calcium: 7.8 mg/dL — ABNORMAL LOW (ref 8.9–10.3)
Chloride: 108 mmol/L (ref 98–111)
Creatinine, Ser: 1.17 mg/dL (ref 0.61–1.24)
GFR, Estimated: >60 mL/min (ref >60)
Glucose, Bld: 133 mg/dL — ABNORMAL HIGH (ref 70–99)
Potassium: 3.3 mmol/L — ABNORMAL LOW (ref 3.5–5.1)
Sodium: 139 mmol/L (ref 135–145)
Total Bilirubin: 17.5 mg/dL — ABNORMAL HIGH (ref 0.0–1.2)
Total Protein: 4.6 g/dL — ABNORMAL LOW (ref 6.5–8.1)

## 2023-08-30 LAB — PROTIME-INR
INR: 2.3 — ABNORMAL HIGH (ref 0.8–1.2)
Prothrombin Time: 25.5 s — ABNORMAL HIGH (ref 11.4–15.2)

## 2023-08-30 LAB — PHOSPHORUS
Phosphorus: 2.5 mg/dL (ref 2.5–4.6)
Phosphorus: 2.3 mg/dL — ABNORMAL LOW (ref 2.5–4.6)

## 2023-08-30 LAB — GLUCOSE, CAPILLARY
Glucose-Capillary: 129 mg/dL — ABNORMAL HIGH (ref 70–99)
Glucose-Capillary: 120 mg/dL — ABNORMAL HIGH (ref 70–99)
Glucose-Capillary: 145 mg/dL — ABNORMAL HIGH (ref 70–99)
Glucose-Capillary: 151 mg/dL — ABNORMAL HIGH (ref 70–99)
Glucose-Capillary: 154 mg/dL — ABNORMAL HIGH (ref 70–99)

## 2023-08-30 LAB — MAGNESIUM
Magnesium: 1.9 mg/dL (ref 1.7–2.4)
Magnesium: 2 mg/dL (ref 1.7–2.4)

## 2023-08-30 MED ORDER — SODIUM CHLORIDE 0.9% FLUSH: 10.0000 mL | INTRAVENOUS | Status: DC | PRN

## 2023-08-30 MED ORDER — SODIUM CHLORIDE 0.9% FLUSH
10.0000 mL | Freq: Two times a day (BID) | INTRAVENOUS | Status: DC
Start: 2023-08-30 — End: 2023-09-01
  Administered 2023-08-30: 40 mL
  Administered 2023-08-31 (×2): 10 mL

## 2023-08-30 MED ORDER — POTASSIUM CHLORIDE 20 MEQ PO PACK
40.0000 meq | PACK | Freq: Two times a day (BID) | ORAL | Status: AC
  Administered 2023-08-30: 40 meq
  Filled 2023-08-30: qty 2

## 2023-08-30 MED ORDER — MIDODRINE HCL 5 MG PO TABS
10.0000 mg | ORAL_TABLET | Freq: Three times a day (TID) | ORAL | Status: DC
  Administered 2023-08-30 – 2023-08-31 (×3): 10 mg
  Filled 2023-08-30 (×3): qty 2

## 2023-08-30 MED ORDER — VITAL HIGH PROTEIN PO LIQD
1000.0000 mL | ORAL | Status: DC
  Administered 2023-08-30: 1000 mL

## 2023-08-30 MED ORDER — ALBUMIN HUMAN 25 % IV SOLN
25.0000 g | Freq: Three times a day (TID) | INTRAVENOUS | Status: AC
  Administered 2023-08-30 – 2023-08-31 (×3): 25 g via INTRAVENOUS
  Filled 2023-08-30 (×3): qty 100

## 2023-08-30 MED ORDER — FUROSEMIDE 10 MG/ML IJ SOLN
40.0000 mg | Freq: Three times a day (TID) | INTRAMUSCULAR | Status: AC
Start: 2023-08-30 — End: 2023-08-31
  Administered 2023-08-30 – 2023-08-31 (×3): 40 mg via INTRAVENOUS
  Filled 2023-08-30 (×3): qty 4

## 2023-08-30 MED ORDER — VITAL AF 1.2 CAL PO LIQD
1000.0000 mL | ORAL | Status: DC
Start: 2023-08-30 — End: 2023-08-31
  Administered 2023-08-30: 1000 mL

## 2023-08-30 MED ORDER — FENTANYL CITRATE PF 50 MCG/ML IJ SOSY
12.5000 ug | PREFILLED_SYRINGE | INTRAMUSCULAR | Status: DC | PRN
  Administered 2023-08-31 – 2023-09-01 (×2): 12.5 ug via INTRAVENOUS
  Filled 2023-08-30 (×2): qty 1

## 2023-08-30 NOTE — Plan of Care (Signed)
  Problem: Clinical Measurements: Goal: Cardiovascular complication will be avoided Outcome: Progressing   Problem: Nutrition: Goal: Adequate nutrition will be maintained Outcome: Progressing   Problem: Pain Managment: Goal: General experience of comfort will improve and/or be controlled Outcome: Progressing   Problem: Safety: Goal: Ability to remain free from injury will improve Outcome: Progressing

## 2023-08-30 NOTE — Progress Notes (Signed)
 [facility] Progress Note  Kevin Velez 73 y.o. [birth year]  CC: GI bleed, duodenal ulcer, cirrhosis   Subjective: Patient seen and examined at bedside in the ICU.  Remains intubated.  His brother was at bedside.  ICU team also at bedside.  Continues to have maroon-colored blood in the Flexi-Seal.  ROS : Not able to obtain   Objective: Vital signs in last 24 hours: Vitals:   08/30/23 0900 08/30/23 0915  BP: (!) 146/83   Pulse: 89 91  Resp: 18 (!) 21  Temp: 99 F (37.2 C) 99.1 F (37.3 C)  SpO2: 97% 96%    Physical Exam: Currently intubated and sedated, abdominal exam benign.  Bowel sound present.  Lab Results: Recent Labs    08/29/23 0220 08/29/23 0324 08/29/23 1228 08/30/23 0814 08/30/23 0938  NA 140  --   --  139  --   K 2.5*  --  4.2 3.3*  --   CL 105  --   --  108  --   CO2 22  --   --  21*  --   GLUCOSE 121*  --   --  133*  --   BUN 36*  --   --  28*  --   CREATININE 1.19  --   --  1.17  --   CALCIUM 7.7*  --   --  7.8*  --   MG  --  1.8  --   --  1.9  PHOS  --  3.0  --   --  2.5   Recent Labs    08/29/23 0220 08/30/23 0814  AST 131* 135*  ALT 92* 87*  ALKPHOS 61 63  BILITOT 16.2* 17.5*  PROT 4.6* 4.6*  ALBUMIN  2.5* 2.6*   Recent Labs    08/28/23 1748 08/29/23 0220 08/29/23 1418  WBC 11.2* 12.4* 12.1*  NEUTROABS 9.5* 10.5*  --   HGB 8.5* 8.4* 8.8*  HCT 23.9* 23.0* 25.3*  MCV 86.9 86.5 89.7  PLT 93* 116* 112*   Recent Labs    08/29/23 0922 08/30/23 0626  LABPROT 25.3* 25.5*  INR 2.3* 2.3*      Assessment/Plan: -Recurrent GI bleed from large duodenal ulcer.  Status post endoscopic treatment on August 14, 2023 followed by IR guided embolization of GDA on Aug 23, 2023.  EGD Aug 26, 2023 showed large blood clot in the duodenum which could not be washed off and fresh blood in the duodenum. CT angio 5/9 showed no significant active bleeding but showed possible tiny source of enhancement in the proximal duodenum which could be an  aberrant vessel that became apparent after GDA embolization.  - Decompensated cirrhosis with coagulopathy.  INR of 2.3 today. MELD 3.0 of 30 as of 5/12 - Acute kidney injury.  Improving - Hypokalemia and electrolyte disturbance.  On supplements. - Unable to protect airway.  Remains intubated.  Recommendations ------------------------ -I had a discussion with patient's brother again today in the patient's room.  ICU team was also at bedside.  His hemoglobin is stable now.  ICU team is planning for extubation trial. -No significant improvement in his liver conditions.  INR remains elevated.  T. bili remains elevated at 17.5.  Given his decompensated cirrhosis with coagulopathy and elevated MELD score, long-term prognosis is poor.    - His hemoglobin is relatively stable now.  - Recommend to continue supportive care with IV Protonix  40 mg twice daily. -Prednisone has been discontinued. - Recommend continued daily IV vitamin K  to bring INR less than 2  - GI will follow.    Felecia Hopper MD, FACP 08/30/2023, 10:38 AM  Contact #  [phone]

## 2023-08-30 NOTE — Progress Notes (Signed)
 NAME:  Kevin Velez, MRN:  [id number], DOB:  [birth year], LOS: 7 ADMISSION DATE:  08/23/2023, CONSULTATION DATE:  08/30/2023  REFERRING MD:  Quin Brush APP, CHIEF COMPLAINT:  GI bleeding, hypotension   History of Present Illness:  73 year old man, group home with a recent admission 4/21 to 5/3 with upper GI bleed and acute liver failure.  He presented with hemoglobin of 3, confusion and encephalopathy and was found to have cirrhosis of the liver.  He underwent paracentesis 4/23 which was negative for SBP he underwent EGD on 4/27 when he developed hematochezia and shock, that showed large duodenal ulcer, visible incident with active bleeding that was treated with epinephrine  injection, cautery numerous small with melanotic stools esophageal varices.  Last transfusion was 4/28 and he was discharged to SNF on 5/3 with bilirubin of 20 He was readmitted 5/6 for  hematochezia with melena , CT angio showed active high volume upper GI bleeding from duodenal bulb region. Hb 8.1,  Platelets 151, INR 2.0, bilirubin was 17.1. He underwent mesenteric angiogram and gastroduodenal artery embolization. He was seen by [facility] He developed recurrent bleeding and hypotension on 5/8 with drop in hemoglobin from 7.3-6.3, transfuse 1 unit PRBC, started on Levophed . GI emergently called, PCCM consulted   Pertinent  Medical History  type 2 diabetes,  hypertension,  alcoholic liver cirrhosis  Significant Hospital Events: Including procedures, antibiotic start and stop dates in addition to other pertinent events   5/6 admit for GI bleeding > Gastroduodenal artery embolization.  5/8 recurrent bleeding, hypotension. To ICU > Pressors > additional blood products.  5/9 EGD: mild portal gastropathy, non-bleeding cratered small duodenal ulcer, clot at bulb of duodenem. Required significant transfusions. On levo/vaso/epi. Code status changed to DNR after discussion with brother. 5/10 vasopressors weaned down, remained  intubated 5/11 of bloody bowel movement output overnight, still on pressors.  GI felt he was still likely losing some blood and that coagulopathy could be contributing.  Recommended continuing PPI twice daily, and avoiding steroids as well as nonsteroidals, also recommended keeping INR less than 2 5/12 hemoglobin has been stable, still having frank dark blood from rectal tube.  Still on pressors, but seemingly at least some degree due to sedation.  Got TXA IV.  Stopped fentanyl  and propofol  infusion started Precedex 5/13 no gross blood from rectal tube hemodynamic stable however still on low-dose pressors minimally responsive on Precedex so discontinued advancing tube feeds  Interim History / Subjective:   Sedated on mechanical ventilator currently heavily sedated requiring painful stimulus for response  Objective    Blood pressure (!) 146/83, pulse 91, temperature 99.1 F (37.3 C), resp. rate (!) 21, height 5\' 7"  (1.702 m), weight 72.4 kg, SpO2 96%.    Vent Mode: PRVC FiO2 (%):  [40 %] 40 % Set Rate:  [18 bmp] 18 bmp Vt Set:  [520 mL] 520 mL PEEP:  [5 cmH20] 5 cmH20 Plateau Pressure:  [16 cmH20-18 cmH20] 16 cmH20   Intake/Output Summary (Last 24 hours) at 08/30/2023 1033 Last data filed at 08/30/2023 1000 Gross per 24 hour  Intake 1413.46 ml  Output 778 ml  Net 635.46 ml   Filed Weights   08/23/23 1450 08/29/23 0417 08/30/23 0701  Weight: 62.3 kg 72.1 kg 72.4 kg    Examination:  General chronically ill-appearing 73 year old male patient remains on full ventilator support.  He is off from propofol  and fentanyl  infusion, remains minimally responsive on Precedex  HEENT temporal wasting orally intubated sclera are icteric  Pulmonary  diminished both bases no current accessory use on minimal ventilator settings.  Attempted spontaneous breathing trial however would not initiate ventilatory effort portable chest x-ray from 5/12 and some mild pulmonary vascular congestion, the  endotracheal tube was angled towards the right, he had left sided volume loss Cardiac regular rate and rhythm without murmur rub or gallop  Abdomen soft, he has a Flexi-Seal in place I do not see frank blood today tolerating tube feeds  GU concentrated yellow urine  Neuro grimaces to painful stimulus, will localize however not following commands    Resolved Hospital Problem list     Non-gap metabolic acidosis Hemorrhagic shock Assessment & Plan:   Recurrent acute upper GI bleed due to duodenal ulcer, portal gastropathy, and known varices (non-bleeding) Had fairly significant oozing from the rectal tube with a fair amount of gross blood loss on 5/12.  He received vitamin K as well as TXA on 5/12.  Hemoglobin appears to be holding.  Output without gross blood today plan Continuing twice daily PPI Continue Carafate  He is on day number 8 ceftriaxone , and day #3 Flagyl .  We had to continue to this prophylactically given active bleeding.  At this point we will aim for another 48 hours assuming he is stable, that will be a total of 10 days of ceftriaxone  and 5 days of Flagyl  Ensure that his INR is less than 2, repeating vitamin K replacement Ensure platelet count greater than 100,000 if actively bleeding Trigger for transfusion is hemoglobin less than 7 or evidence of active bleeding with hemodynamic instability Spoke with GI, we will advance diet today  Circulatory shock.  Had been slowly using, but continue to feel that some his hypotension is due to sedation and some may even be chronic in with his underlying liver disease  I do not think he is actively hemorrhaging now  plan Change MAP goal to 60 Add midodrine  via tube Watch for active bleeding Keep euvolemic  Ventilator management, intubated for airway protection 5/9 - Endotracheal tube malpositioned into the right mainstem bronchus, otherwise seems like sedation is the barrier to weaning at this point Plan Discontinue Precedex, use  low-dose fentanyl  for pain if needed RASS goal -1 VAP bundle Assess for spontaneous breathing trial after at goal RASS Repeat chest x-ray to follow-up left-sided volume loss and endotracheal tube placement  Fluid and electrolyte imbalance: Hypokalemia Plan Replace and follow-up  AKI - Creatinine improving Plan Avoid nephrotoxins Ensure adequate mean arterial pressure Strict intake output A.m. chemistry  Coagulopathy Thrombocytopenia Platelets are better, still a bit coagulopathic Plan Repeating vitamin K INR and CBC in the morning Repeat TXA if developes active bleeding  Acute metabolic encephalopathy, and hepatic encephalopathy Plan PAD protocol RASS goal 0 Discontinue Precedex Resume lactulose    EtOH hepatitis/cirrhosis/acute liver failure -was on prednisone but this has been DC'd.  Plan Follow LFTs  Nutrition Hyperglycemia Plan SSI AC given cirrhosis use sensitive scale, goal 140-180 Advance tube feeds as tolerated   Goals of care Continuing to discuss goals of care going forward with the family.  I have highly recommended they continue to discuss this.  I have recommended home hospice if he survives this illness, he may even require residential hospice.  His family would like him to be able to be part of this conversation at this point he remains a DNR, I have suggested to the family should he decline again we consider comfort they are taking this under advisement Best Practice (right click and "Reselect all SmartList Selections"  daily)   Diet/type: tubefeeds starting trickle feeds 5/12 DVT prophylaxis SCD Pressure ulcer(s): N/A GI prophylaxis: PPI Lines: Central line Foley:  Yes, and it is still needed Code Status:  DNR Last date of multidisciplinary goals of care discussion [Brother updated at bedside today]   Critical care time:  35 minutes

## 2023-08-30 NOTE — Progress Notes (Signed)
 Nutrition Follow-up  DOCUMENTATION CODES:   Severe malnutrition in context of chronic illness  INTERVENTION:  - Per CCM, can begin advancing towards goal TF today. Recommend: Vital 1.2 at 55 ml/h (1320 ml per day) *Start at 42mL/hr and advance by 10mL Q12H Provides 1584 kcal, 99 gm protein, 1070 ml free water  daily   - Monitor magnesium , potassium, and phosphorus BID for at least 3 days, MD to replete as needed, as pt is at risk for refeeding syndrome given severe malnutrition.   - FWF per CCM.  - Monitor weight trends.   NUTRITION DIAGNOSIS:   Severe Malnutrition related to chronic illness as evidenced by severe fat depletion, severe muscle depletion. *ongoing  GOAL:   Patient will meet greater than or equal to 90% of their needs *progressing, on TF  MONITOR:   Vent status, Labs, Weight trends, TF tolerance  REASON FOR ASSESSMENT:   Ventilator    ASSESSMENT:   73 y.o. male from a group home with a recent admission 4/21 to 5/3 with upper GI bleed and acute liver failure. Was discharged to SNF on 5/3 but readmitted 5/6 for hematochezia with melena with CT angio showed active high volume upper GI bleeding from duodenal bulb region.  5/6 Admit; Gastroduodenal artery embolization  5/8 Hypotension, transferred to ICU and pressors added 5/9 EGD; Intubated, OGT placed 5/12 Trickle TF's initiated    Patient remains intubated on ventilator support MV: 9.4 L/min Temp (24hrs), Avg:97.6 F (36.4 C), Min:94.1 F (34.5 C), Max:99.1 F (37.3 C)    Trickle tube feeds initiated yesterday via OGT. No noted intolerances. Potassium remains low, phosphorus also noted to have dropped since initiating tube feeds.  Per discussion with CCM NP, can begin advancing tube feeds towards goal today.  Suspect patient remains at risk of refeeding syndrome.    Admit weight: 112# Current weight: 159# I&O's: +15.6L since admit + for very deep pitting generalized and left and right lower  extremity edema   Medications reviewed and include: Lasix, Lactulose  TID, Vitamin K Levophed  @ 9 mcg/min   Labs reviewed:  K+ 3.3    Diet Order:   Diet Order             Diet NPO time specified  Diet effective now                   EDUCATION NEEDS:  Not appropriate for education at this time  Skin:  Skin Assessment: Skin Integrity Issues: Skin Integrity Issues:: Stage II Stage II: Left Buttocks  Last BM:  5/12 - type 7 - rectal tube  Height:  Ht Readings from Last 1 Encounters:  08/23/23 5\' 7"  (1.702 m)   Weight:  Wt Readings from Last 1 Encounters:  08/30/23 72.4 kg    BMI:  Body mass index is 25 kg/m.  Estimated Nutritional Needs:  Kcal:  1500-1700 kcals Protein:  85-100 grams Fluid:  >/= 1.5L    Scheryl Cushing RD, LDN Contact via Secure Chat.

## 2023-08-30 NOTE — Progress Notes (Signed)
 Chaplain visited while rounding on unit. Pt was not alert at the time of this visit. Pt's brother was at bedside, he had no specific needs today. Chaplain provided reflective listening as he shared about their family. Additional support available upon request.  Chaplain Levern Reader, M. Div.    08/30/23 0900  Spiritual Encounters  Type of Visit Follow up  Care provided to: Family  Referral source Nurse (RN/NT/LPN)  Reason for visit Routine spiritual support  OnCall Visit No  Spiritual Framework  Presenting Themes Meaning/purpose/sources of inspiration  Patient Stress Factors Health changes  Interventions  Spiritual Care Interventions Made Compassionate presence;Reflective listening  Intervention Outcomes  Outcomes Connection to spiritual care

## 2023-08-31 DIAGNOSIS — K922 Gastrointestinal hemorrhage, unspecified: Secondary | ICD-10-CM | POA: Diagnosis not present

## 2023-08-31 DIAGNOSIS — J9601 Acute respiratory failure with hypoxia: Secondary | ICD-10-CM | POA: Diagnosis not present

## 2023-08-31 DIAGNOSIS — G9341 Metabolic encephalopathy: Secondary | ICD-10-CM | POA: Diagnosis not present

## 2023-08-31 DIAGNOSIS — D696 Thrombocytopenia, unspecified: Secondary | ICD-10-CM | POA: Diagnosis not present

## 2023-08-31 DIAGNOSIS — K7031 Alcoholic cirrhosis of liver with ascites: Secondary | ICD-10-CM | POA: Diagnosis not present

## 2023-08-31 LAB — COMPREHENSIVE METABOLIC PANEL WITH GFR
ALT: 68 U/L — ABNORMAL HIGH (ref 0–44)
AST: 107 U/L — ABNORMAL HIGH (ref 15–41)
Albumin: 3 g/dL — ABNORMAL LOW (ref 3.5–5.0)
Alkaline Phosphatase: 74 U/L (ref 38–126)
Anion gap: 12 (ref 5–15)
BUN: 29 mg/dL — ABNORMAL HIGH (ref 8–23)
CO2: 20 mmol/L — ABNORMAL LOW (ref 22–32)
Calcium: 8.5 mg/dL — ABNORMAL LOW (ref 8.9–10.3)
Chloride: 111 mmol/L (ref 98–111)
Creatinine, Ser: 1.25 mg/dL — ABNORMAL HIGH (ref 0.61–1.24)
GFR, Estimated: >60 mL/min (ref >60)
Glucose, Bld: 118 mg/dL — ABNORMAL HIGH (ref 70–99)
Potassium: 2.8 mmol/L — ABNORMAL LOW (ref 3.5–5.1)
Sodium: 143 mmol/L (ref 135–145)
Total Bilirubin: 16.8 mg/dL — ABNORMAL HIGH (ref 0.0–1.2)
Total Protein: 5 g/dL — ABNORMAL LOW (ref 6.5–8.1)

## 2023-08-31 LAB — AMMONIA: Ammonia: 77 umol/L — ABNORMAL HIGH (ref 9–35)

## 2023-08-31 LAB — GLUCOSE, CAPILLARY
Glucose-Capillary: 129 mg/dL — ABNORMAL HIGH (ref 70–99)
Glucose-Capillary: 137 mg/dL — ABNORMAL HIGH (ref 70–99)
Glucose-Capillary: 145 mg/dL — ABNORMAL HIGH (ref 70–99)
Glucose-Capillary: 150 mg/dL — ABNORMAL HIGH (ref 70–99)

## 2023-08-31 LAB — PREPARE FRESH FROZEN PLASMA: Unit division: 0

## 2023-08-31 LAB — BPAM FFP
Blood Product Expiration Date: 202505142359
Blood Product Expiration Date: 202505142359
Blood Product Expiration Date: 202505142359
ISSUE DATE / TIME: 202505091214
ISSUE DATE / TIME: 202505091743
Unit Type and Rh: 5100
Unit Type and Rh: 8400
Unit Type and Rh: 8400

## 2023-08-31 LAB — PROTIME-INR
INR: 2.8 — ABNORMAL HIGH (ref 0.8–1.2)
Prothrombin Time: 29.9 s — ABNORMAL HIGH (ref 11.4–15.2)

## 2023-08-31 LAB — CBC
HCT: 19.4 % — ABNORMAL LOW (ref 39.0–52.0)
HCT: 20.6 % — ABNORMAL LOW (ref 39.0–52.0)
Hemoglobin: 6.7 g/dL — CL (ref 13.0–17.0)
Hemoglobin: 7 g/dL — ABNORMAL LOW (ref 13.0–17.0)
MCH: 32.1 pg (ref 26.0–34.0)
MCH: 31.4 pg (ref 26.0–34.0)
MCHC: 34.5 g/dL (ref 30.0–36.0)
MCHC: 34 g/dL (ref 30.0–36.0)
MCV: 92.8 fL (ref 80.0–100.0)
MCV: 92.4 fL (ref 80.0–100.0)
Platelets: 52 10*3/uL — ABNORMAL LOW (ref 150–400)
Platelets: 49 10*3/uL — ABNORMAL LOW (ref 150–400)
RBC: 2.09 MIL/uL — ABNORMAL LOW (ref 4.22–5.81)
RBC: 2.23 MIL/uL — ABNORMAL LOW (ref 4.22–5.81)
RDW: 17.9 % — ABNORMAL HIGH (ref 11.5–15.5)
RDW: 17.1 % — ABNORMAL HIGH (ref 11.5–15.5)
WBC: 9 10*3/uL (ref 4.0–10.5)
WBC: 10.3 10*3/uL (ref 4.0–10.5)
nRBC: 0.2 % (ref 0.0–0.2)
nRBC: 0 % (ref 0.0–0.2)

## 2023-08-31 LAB — PREPARE RBC (CROSSMATCH)

## 2023-08-31 LAB — MAGNESIUM: Magnesium: 1.9 mg/dL (ref 1.7–2.4)

## 2023-08-31 LAB — PHOSPHORUS: Phosphorus: 2 mg/dL — ABNORMAL LOW (ref 2.5–4.6)

## 2023-08-31 MED ORDER — LACTULOSE 10 GM/15ML PO SOLN: 30.0000 g | Freq: Two times a day (BID) | ORAL | Status: DC | PRN

## 2023-08-31 MED ORDER — POTASSIUM & SODIUM PHOSPHATES 280-160-250 MG PO PACK
2.0000 | PACK | ORAL | Status: DC
  Administered 2023-08-31 (×2): 2
  Filled 2023-08-31 (×3): qty 2

## 2023-08-31 MED ORDER — MORPHINE SULFATE (PF) 2 MG/ML IV SOLN
2.0000 mg | INTRAVENOUS | Status: DC | PRN
  Administered 2023-08-31 (×3): 4 mg via INTRAVENOUS
  Administered 2023-08-31: 2 mg via INTRAVENOUS
  Administered 2023-08-31: 4 mg via INTRAVENOUS
  Filled 2023-08-31 (×2): qty 2
  Filled 2023-08-31: qty 1

## 2023-08-31 MED ORDER — POTASSIUM CHLORIDE 20 MEQ PO PACK
40.0000 meq | PACK | Freq: Once | ORAL | Status: AC
  Administered 2023-08-31: 40 meq
  Filled 2023-08-31: qty 2

## 2023-08-31 MED ORDER — GLYCOPYRROLATE 0.2 MG/ML IJ SOLN: 0.2000 mg | INTRAMUSCULAR | Status: DC | PRN

## 2023-08-31 MED ORDER — GLYCOPYRROLATE 0.2 MG/ML IJ SOLN
0.2000 mg | INTRAMUSCULAR | Status: DC | PRN
Start: 2023-08-31 — End: 2023-09-02
  Administered 2023-09-01 – 2023-09-02 (×4): 0.2 mg via INTRAVENOUS
  Filled 2023-08-31 (×5): qty 1

## 2023-08-31 MED ORDER — MORPHINE 100MG IN NS 100ML (1MG/ML) PREMIX INFUSION
1.0000 mg/h | INTRAVENOUS | Status: DC
  Administered 2023-08-31: 1 mg/h via INTRAVENOUS
  Filled 2023-08-31: qty 100

## 2023-08-31 MED ORDER — MORPHINE 100MG IN NS 100ML (1MG/ML) PREMIX INFUSION
1.0000 mg/h | INTRAVENOUS | Status: DC
  Administered 2023-08-31 – 2023-09-01 (×2): 5 mg/h via INTRAVENOUS
  Administered 2023-09-01: 8 mg/h via INTRAVENOUS
  Filled 2023-08-31 (×2): qty 100

## 2023-08-31 MED ORDER — ACETAMINOPHEN 650 MG RE SUPP: 650.0000 mg | Freq: Four times a day (QID) | RECTAL | Status: DC | PRN

## 2023-08-31 MED ORDER — ACETAMINOPHEN 325 MG PO TABS: 650.0000 mg | ORAL_TABLET | Freq: Four times a day (QID) | ORAL | Status: DC | PRN

## 2023-08-31 MED ORDER — POLYVINYL ALCOHOL 1.4 % OP SOLN: 1.0000 [drp] | Freq: Four times a day (QID) | OPHTHALMIC | Status: DC | PRN

## 2023-08-31 MED ORDER — MAGNESIUM SULFATE 2 GM/50ML IV SOLN
2.0000 g | Freq: Once | INTRAVENOUS | Status: AC
  Administered 2023-08-31: 2 g via INTRAVENOUS
  Filled 2023-08-31: qty 50

## 2023-08-31 MED ORDER — SODIUM CHLORIDE 0.9% IV SOLUTION: Freq: Once | INTRAVENOUS | Status: AC

## 2023-08-31 MED ORDER — MORPHINE BOLUS VIA INFUSION
2.0000 mg | INTRAVENOUS | Status: DC | PRN
Start: 2023-08-31 — End: 2023-09-03
  Administered 2023-08-31 – 2023-09-01 (×7): 4 mg via INTRAVENOUS

## 2023-08-31 MED ORDER — GLYCOPYRROLATE 1 MG PO TABS: 1.0000 mg | ORAL_TABLET | ORAL | Status: DC | PRN

## 2023-08-31 NOTE — Procedures (Signed)
 Extubation Procedure Note  Patient Details:   Name: Kevin Velez DOB: 1951-03-04 MRN: [id number]   Airway Documentation:  Airway 7.5 mm (Active)  Secured at (cm) 24 cm 08/31/23 1205  Measured From Lips 08/31/23 1205  Secured Location Left 08/31/23 1205  Secured By [employer] 08/31/23 1205  Bite Block No 08/31/23 1205  Tube Holder Repositioned Yes 08/31/23 1205  Prone position No 08/31/23 1205  Cuff Pressure (cm H2O) MOV (Manual Technique) 08/31/23 1205  Site Condition Dry 08/31/23 0825   Vent end date: 08/31/23 Vent end time: 1340   Evaluation  O2 sats: stable throughout Complications: No apparent complications Patient did tolerate procedure well. Bilateral Breath Sounds: Clear, Diminished   Yes  Pt extubated per MD order.  Pt placed on 4L Junior.  Sats 97%, HR 120, RR 18.  RN at bedside.    Alinda Apley 08/31/2023, 1:44 PM

## 2023-08-31 NOTE — IPAL (Signed)
  Interdisciplinary Goals of Care Family Meeting   Date carried out: 08/31/2023  Location of the meeting: Bedside  Member's involved: Patient,Nurse Practitioner and Family Member or next of kin  Durable Power of Attorney or Environmental health practitioner: Patient but if he is unable to communicate it defaults to his siblings  Discussion: Kevin Velez was able to be successfully extubated this afternoon and therefore he was able to participate in the discussion.  He and his family were updated regarding current plan of care and projected outcomes.  Kevin Velez understands that his current disease process is incurable and will continue to progress with that knowledge Kevin Velez has decided to change his focus from aggressive interventions to comfort care only.  He has requested to start comfort medications like morphine and has requested a therapeutic paracentesis if applicable.  Family is aware of decision and in agreement  Code status:   Code Status: Do not attempt resuscitation (DNR) - Comfort care   Disposition: In-patient comfort care  Time spent for the meeting: 40 mins   Kevin Velez D. Harris, NP-C [facility] Personal contact information can be found on Amion  If no contact or response made please call [phone] 08/31/2023, 2:23 PM

## 2023-08-31 NOTE — Plan of Care (Signed)
  Problem: Clinical Measurements: Goal: Cardiovascular complication will be avoided Outcome: Progressing   Problem: Activity: Goal: Risk for activity intolerance will decrease Outcome: Progressing   Problem: Nutrition: Goal: Adequate nutrition will be maintained Outcome: Progressing   Problem: Elimination: Goal: Will not experience complications related to bowel motility Outcome: Progressing Goal: Will not experience complications related to urinary retention Outcome: Progressing   Problem: Pain Managment: Goal: General experience of comfort will improve and/or be controlled Outcome: Progressing   Problem: Safety: Goal: Ability to remain free from injury will improve Outcome: Progressing

## 2023-08-31 NOTE — Progress Notes (Signed)
 [facility] ADULT ICU REPLACEMENT PROTOCOL   The patient does apply for the [facility] Adult ICU Electrolyte Replacment Protocol based on the criteria listed below:   1.Exclusion criteria: TCTS, ECMO, Dialysis, and Myasthenia Gravis patients 2. Is GFR >/= 30 ml/min? Yes Patient's GFR today is >60 3. Is SCr </= 2? Yes.   Patient's SCr is 1.25 mg/dL 4. Did SCr increase >/= 0.5 in 24 hours? No. 5.Pt's weight >40kg  Yes.   6. Abnormal electrolyte(s): K+ 2.8, Mag 1.9, Phos 2.0  7. Electrolytes replaced per protocol 8.  Call MD STAT for K+ </= 2.5, Phos </= 1, or Mag </= 1 Physician:  Boyd Cabal [facility] 08/31/2023 6:51 AM

## 2023-08-31 NOTE — Progress Notes (Signed)
   08/31/23 1455  Spiritual Encounters  Type of Visit Follow up  Care provided to: Family  Referral source Chaplain assessment  Reason for visit Routine spiritual support   I met briefly with Mr. Taddei's sister, Mrs. Rochelle Chu, and Paulette who I believe is his niece.  Family debriefed with me around improvements to Mr. Flakes's health and we shared in celebration for having some time where he could recognize family presence and support. We discussed some questions around palliative care and goals of care. I provided compassionate support and affirmed their faith.  Family knows that spiritual care support remains available. Herb Beltre L. Minetta Aly, M.Div [phone]

## 2023-08-31 NOTE — Procedures (Signed)
 Paracentesis Procedure Note  JACALEB PUHR  [id number]  1951/03/26  Date:08/31/23  Time:3:50 PM   Provider Performing:Kiondre Grenz D. Harris    Procedure: Paracentesis with imaging guidance (40981)  Indication(s) Ascites  Consent Risks of the procedure as well as the alternatives and risks of each were explained to the patient and/or caregiver.  Consent for the procedure was obtained and is signed in the bedside chart  Anesthesia Topical only with 1% lidocaine     Time Out Verified patient identification, verified procedure, site/side was marked, verified correct patient position, special equipment/implants available, medications/allergies/relevant history reviewed, required imaging and test results available.   Sterile Technique Maximal sterile technique including full sterile barrier drape, hand hygiene, sterile gown, sterile gloves, mask, hair covering, sterile ultrasound probe cover (if used).   Procedure Description Ultrasound used to identify appropriate peritoneal anatomy for placement and overlying skin marked.  Area of drainage cleaned and draped in sterile fashion. Lidocaine  was used to anesthetize the skin and subcutaneous tissue.  2300 cc's of yellow appearing fluid was drained. Catheter then removed and bandaid applied to site.   Complications/Tolerance None; patient tolerated the procedure well.   EBL Minimal   Specimen(s) Peritoneal fluid  Kasmira Cacioppo D. Harris, NP-C  [facility]ulmonary [facility] Personal contact information can be found on Amion  If no contact or response made please call [phone] 08/31/2023, 3:51 PM

## 2023-08-31 NOTE — Progress Notes (Signed)
 eLink Physician-Brief Progress Note Patient Name: Kevin Velez DOB: 1950-07-14 MRN: [id number]   Date of Service  08/31/2023  HPI/Events of Note  Hemoglobin 6.7 gm / dl. No evidence of active bleeding.  eICU Interventions  One unit of PRBC ordered transfused.        Lonetta Blassingame U Clarivel Callaway 08/31/2023, 6:17 AM

## 2023-08-31 NOTE — Progress Notes (Signed)
 [facility] Progress Note  Kevin Velez 73 y.o. 10-Feb-1951  CC: GI bleed, duodenal ulcer, cirrhosis   Subjective: Patient seen and examined at bedside in the ICU.  Remains intubated.  Sedation has been turned off.  He continues to have ongoing bleeding.  ROS : Not able to obtain   Objective: Vital signs in last 24 hours: Vitals:   08/31/23 1000 08/31/23 1007  BP: (!) 90/45   Pulse: 93 95  Resp: 19 19  Temp:    SpO2: 97% 96%    Physical Exam: Currently intubated , able to move his eyes ,abdominal exam benign.  Bowel sound present.  Lab Results: Recent Labs    08/30/23 0814 08/30/23 0938 08/30/23 1749 08/31/23 0545  NA 139  --   --  143  K 3.3*  --   --  2.8*  CL 108  --   --  111  CO2 21*  --   --  20*  GLUCOSE 133*  --   --  118*  BUN 28*  --   --  29*  CREATININE 1.17  --   --  1.25*  CALCIUM 7.8*  --   --  8.5*  MG  --    < > 2.0 1.9  PHOS  --    < > 2.3* 2.0*   < > = values in this interval not displayed.   Recent Labs    08/30/23 0814 08/31/23 0545  AST 135* 107*  ALT 87* 68*  ALKPHOS 63 74  BILITOT 17.5* 16.8*  PROT 4.6* 5.0*  ALBUMIN  2.6* 3.0*   Recent Labs    08/28/23 1748 08/29/23 0220 08/29/23 1418 08/30/23 2041 08/31/23 0545  WBC 11.2* 12.4*   < > 8.4 9.0  NEUTROABS 9.5* 10.5*  --   --   --   HGB 8.5* 8.4*   < > 7.1* 6.7*  HCT 23.9* 23.0*   < > 21.4* 19.4*  MCV 86.9 86.5   < > 93.4 92.8  PLT 93* 116*   < > 55* 52*   < > = values in this interval not displayed.   Recent Labs    08/30/23 0626 08/31/23 0545  LABPROT 25.5* 29.9*  INR 2.3* 2.8*      Assessment/Plan: -Recurrent GI bleed from large duodenal ulcer.  Status post endoscopic treatment on August 14, 2023 followed by IR guided embolization of GDA on Aug 23, 2023.  EGD Aug 26, 2023 showed large blood clot in the duodenum which could not be washed off and fresh blood in the duodenum. CT angio 5/9 showed no significant active bleeding but showed possible tiny  source of enhancement in the proximal duodenum which could be an aberrant vessel that became apparent after GDA embolization.  - Decompensated cirrhosis with coagulopathy.  INR of 2.3 today. MELD 3.0 of 30 as of 5/12 - Acute kidney injury.  Improving - Hypokalemia and electrolyte disturbance.  On supplements. - Unable to protect airway.  Remains intubated.  Recommendations ------------------------ - Detailed discussion today with patient's brother in presence of ICU attending Dr. Fulton Job.  Patient with ongoing bleeding with ongoing hypercoagulable state with INR of 2.8.  - We did discuss that we can consider third endoscopy for treatment of ulcer but his underlying decompensated cirrhosis leading to low platelet counts and hypercoagulable state will probably not allow for any successful endoscopic treatment of ulcer.  -Family is thinking for planning for extubation today and then discussed with patient about goal of care.  -  GI will follow.    Felecia Hopper MD, FACP 08/31/2023, 10:52 AM  Contact #  [phone]

## 2023-08-31 NOTE — Progress Notes (Signed)
 NAME:  Kevin Velez, MRN:  [id number], DOB:  [birth year], LOS: 8 ADMISSION DATE:  08/23/2023, CONSULTATION DATE:  08/31/2023  REFERRING MD:  Quin Brush APP, CHIEF COMPLAINT:  GI bleeding, hypotension   History of Present Illness:  73 year old man, group home with a recent admission 4/21 to 5/3 with upper GI bleed and acute liver failure.  He presented with hemoglobin of 3, confusion and encephalopathy and was found to have cirrhosis of the liver.  He underwent paracentesis 4/23 which was negative for SBP he underwent EGD on 4/27 when he developed hematochezia and shock, that showed large duodenal ulcer, visible incident with active bleeding that was treated with epinephrine  injection, cautery numerous small with melanotic stools esophageal varices.  Last transfusion was 4/28 and he was discharged to SNF on 5/3 with bilirubin of 20 He was readmitted 5/6 for  hematochezia with melena , CT angio showed active high volume upper GI bleeding from duodenal bulb region. Hb 8.1,  Platelets 151, INR 2.0, bilirubin was 17.1. He underwent mesenteric angiogram and gastroduodenal artery embolization. He was seen by [facility] He developed recurrent bleeding and hypotension on 5/8 with drop in hemoglobin from 7.3-6.3, transfuse 1 unit PRBC, started on Levophed . GI emergently called, PCCM consulted  Pertinent  Medical History  type 2 diabetes,  hypertension,  alcoholic liver cirrhosis  Significant Hospital Events: Including procedures, antibiotic start and stop dates in addition to other pertinent events   5/6 admit for GI bleeding > Gastroduodenal artery embolization.  5/8 recurrent bleeding, hypotension. To ICU > Pressors > additional blood products.  5/9 EGD: mild portal gastropathy, non-bleeding cratered small duodenal ulcer, clot at bulb of duodenem. Required significant transfusions. On levo/vaso/epi. Code status changed to DNR after discussion with brother. 5/10 vasopressors weaned down, remained  intubated 5/11 of bloody bowel movement output overnight, still on pressors.  GI felt he was still likely losing some blood and that coagulopathy could be contributing.  Recommended continuing PPI twice daily, and avoiding steroids as well as nonsteroidals, also recommended keeping INR less than 2 5/12 hemoglobin has been stable, still having frank dark blood from rectal tube.  Still on pressors, but seemingly at least some degree due to sedation.  Got TXA IV.  Stopped fentanyl  and propofol  infusion started Precedex 5/13 no gross blood from rectal tube hemodynamic stable however still on low-dose pressors minimally responsive on Precedex so discontinued advancing tube feeds 5/14 hemoglobin again down trended to 6.7 s/p 1 unit PRBCs ordered, INR also elevated at 2.8.  Tolerating SBT trial this a.m.  Interim History / Subjective:  Alert and interactive on ventilator this morning, following commands currently tolerating CPAP trial  Objective    Blood pressure (!) 167/82, pulse 85, temperature (!) 97.4 F (36.3 C), temperature source Axillary, resp. rate 18, height 5\' 7"  (1.702 m), weight 72.4 kg, SpO2 97%.    Vent Mode: PRVC FiO2 (%):  [35 %-40 %] 40 % Set Rate:  [18 bmp] 18 bmp Vt Set:  [520 mL] 520 mL PEEP:  [5 cmH20] 5 cmH20 Plateau Pressure:  [15 cmH20-20 cmH20] 20 cmH20   Intake/Output Summary (Last 24 hours) at 08/31/2023 0819 Last data filed at 08/31/2023 4098 Gross per 24 hour  Intake 1153.26 ml  Output 1693 ml  Net -539.74 ml   Filed Weights   08/23/23 1450 08/29/23 0417 08/30/23 0701  Weight: 62.3 kg 72.1 kg 72.4 kg    Examination:  General: Acute on chronic ill-appearing deconditioned cachectic elderly male lying  in bed in no acute distress HEENT: ETT, MM pink/moist, PERRL,  Neuro: Alert and interactive on ventilator, following commands CV: s1s2 regular rate and rhythm, no murmur, rubs, or gallops,  PULM: Clear to auscultation bilaterally, no increased work of  breathing, no added breath sounds GI: soft, bowel sounds active in all 4 quadrants, non-tender, non-distended Extremities: warm/dry, generalized 1+ pitting edema tracking to ribs  Skin: no rashes or lesions   Resolved Hospital Problem list     Non-gap metabolic acidosis Hemorrhagic shock Assessment & Plan:   Recurrent acute upper GI bleed due to duodenal ulcer, portal gastropathy, and known varices (non-bleeding) -Had fairly significant oozing from the rectal tube with a fair amount of gross blood loss on 5/12.  He received vitamin K as well as TXA on 5/12.  Hemoglobin appears to be holding.  Output without gross blood today P: GI following, appreciate assistance Continue twice daily PPI Remains on Carafate  Continue ceftriaxone  for total of 10 days and Flagyl  for total of 5 Repeat vitamin K supplementation given INR 2.8 Hemoglobin goal greater than 7 Platelet goal greater than 10  Circulatory shock -Had been slowly using, but continue to feel that some his hypotension is due to sedation and some may even be chronic in with his underlying liver disease  -I do not think he is actively hemorrhaging now  P: Vasopressor support improved this a.m. with discontinuation of sedating medications Continuous telemetry Supportive care as above Consider albumin  supplementation Continue midodrine   Ventilator management, intubated for airway protection 5/9 -Endotracheal tube malpositioned into the right mainstem bronchus, otherwise seems like sedation is the barrier to weaning at this point P: Tolerating SBT trial this a.m., can likely extubate today Continue ventilator support with lung protective strategies  Wean PEEP and FiO2 for sats greater than 90%. Head of bed elevated 30 degrees. Plateau pressures less than 30 cm H20.  Follow intermittent chest x-ray and ABG.   SAT/SBT as tolerated, mentation preclude extubation  Ensure adequate pulmonary hygiene  Follow cultures  VAP bundle in  place  PAD protocol  Fluid and electrolyte imbalance: Hypokalemia P: Supplement as needed  AKI -Creatinine improving P: Follow renal function  Dr. Urine output Trend Bmet Avoid nephrotoxins Ensure adequate renal perfusion   Coagulopathy Thrombocytopenia -Platelets are better, still a bit coagulopathic P: Repeat vitamin K as above Trend CBC Transfuse as above Consider repeating TXA if active bleeding develops  Acute metabolic encephalopathy, and hepatic encephalopathy P: Improved Minimize sedating medications Continue lactulose  RASS goal 0  EtOH hepatitis/cirrhosis/acute liver failure -Was on prednisone but this has been DC'd.  P: LFTs  Nutrition Hyperglycemia P: Continue sensitive scale SSI Advance tube feeds as able  Goals of care Continuing to discuss goals of care going forward with the family.  I have highly recommended they continue to discuss this.  I have recommended home hospice if he survives this illness, he may even require residential hospice.  His family would like him to be able to be part of this conversation at this point he remains a DNR, I have suggested to the family should he decline again we consider comfort they are taking this  under advisement  Best Practice (right click and "Reselect all SmartList Selections" daily)   Diet/type: tubefeeds starting trickle feeds 5/12 DVT prophylaxis SCD Pressure ulcer(s): N/A GI prophylaxis: PPI Lines: Central line Foley:  Yes, and it is still needed Code Status:  DNR Last date of multidisciplinary goals of care discussion [Brother updated at bedside today]  Critical care time:  35 minutes  CRITICAL CARE Performed by: Anahid Eskelson D. Harris  Total critical care time: 40 minutes  Critical care time was exclusive of separately billable procedures and treating other patients.  Critical care was necessary to treat or prevent imminent or life-threatening deterioration.  Critical care was time spent  personally by me on the following activities: development of treatment plan with patient and/or surrogate as well as nursing, discussions with consultants, evaluation of patient's response to treatment, examination of patient, obtaining history from patient or surrogate, ordering and performing treatments and interventions, ordering and review of laboratory studies, ordering and review of radiographic studies, pulse oximetry and re-evaluation of patient's condition.  Lazarius Rivkin D. Harris, NP-C  Pulmonary & Critical Care Personal contact information can be found on Amion  If no contact or response made please call [phone] 08/31/2023, 8:23 AM

## 2023-08-31 NOTE — Progress Notes (Signed)
 Nutrition Brief Note  Chart reviewed. Pt now transitioning to comfort care.  No further nutrition interventions planned at this time.  Please re-consult as needed.   Shelle Iron RD, LDN Contact via [employer].

## 2023-09-01 DIAGNOSIS — K7031 Alcoholic cirrhosis of liver with ascites: Secondary | ICD-10-CM | POA: Diagnosis not present

## 2023-09-01 DIAGNOSIS — D62 Acute posthemorrhagic anemia: Secondary | ICD-10-CM | POA: Diagnosis not present

## 2023-09-01 DIAGNOSIS — Z515 Encounter for palliative care: Secondary | ICD-10-CM

## 2023-09-01 DIAGNOSIS — Z7189 Other specified counseling: Secondary | ICD-10-CM

## 2023-09-01 DIAGNOSIS — K922 Gastrointestinal hemorrhage, unspecified: Secondary | ICD-10-CM | POA: Diagnosis not present

## 2023-09-01 DIAGNOSIS — K7210 Chronic hepatic failure without coma: Secondary | ICD-10-CM

## 2023-09-01 LAB — TYPE AND SCREEN
ABO/RH(D): O POS
Antibody Screen: NEGATIVE
Unit division: 0

## 2023-09-01 LAB — BPAM RBC
Blood Product Expiration Date: 202506102359
ISSUE DATE / TIME: 202505140802
Unit Type and Rh: 5100

## 2023-09-01 MED ORDER — HALOPERIDOL LACTATE 2 MG/ML PO CONC: 0.5000 mg | ORAL | Status: DC | PRN

## 2023-09-01 MED ORDER — HALOPERIDOL 0.5 MG PO TABS: 0.5000 mg | ORAL_TABLET | ORAL | Status: DC | PRN

## 2023-09-01 MED ORDER — LORAZEPAM 2 MG/ML IJ SOLN: 1.0000 mg | INTRAMUSCULAR | Status: DC | PRN

## 2023-09-01 MED ORDER — HALOPERIDOL LACTATE 5 MG/ML IJ SOLN: 0.5000 mg | INTRAMUSCULAR | Status: DC | PRN

## 2023-09-01 MED ORDER — MORPHINE BOLUS VIA INFUSION: 4.0000 mg | INTRAVENOUS | Status: DC | PRN

## 2023-09-01 NOTE — Progress Notes (Signed)
 PROGRESS NOTE    BRONCO STEM  ZOX:[id number] DOB: 1950-11-01 DOA: 08/23/2023 PCP: Patient, No Pcp Per   Brief Narrative:  73 year old male with history of alcohol abuse, diabetes mellitus type 2, hypertension, GERD with recent admission from 08/08/2023-08/20/2023 for possible hemorrhagic shock/GI bleeding along with a diagnosis of cirrhosis of liver/acute hepatic failure requiring intubation and subsequent extubation, multiple back red cells, platelets, FFP transfusion with EGD on 08/14/2023 showing large duodenal ulcer, visible vessel with active bleeding treated with epinephrine  injection with repeat CT of abdomen showing no further bleeding and subsequent discharge to SNF.  He presented again on 08/23/2023 with bloody stools with clots along with melanotic stools.  CT angio showed active high-volume upper GI bleeding from duodenal bulb.  Hemoglobin was 8.1 on presentation.  Bilirubin 17.1.  He underwent mesenteric angiogram and gastroduodenal artery embolization by IR.  GI was also consulted.  He developed recurrent bleeding and hypotension on 08/25/2023 with drop in hemoglobin requiring packed red cell transfusion.  PCCM was consulted.  He underwent EGD on 08/26/2023 which showed mild portal gastropathy, nonbleeding cratered small duodenal ulcer, clot at bulb of the abdomen.  Required significant transfusions along with vasopressors.  CODE STATUS changed to DNR after discussion with brother.  He remained intubated.  He continued to have bloody bowel movements needing transfusion.  He also required Precedex.  He was extubated on 08/31/2023 and after discussion with patient and family, patient decided to pursue comfort measures only.  He underwent paracentesis by PCCM on 08/31/2023 per patient's request and 2300 cc of fluid was removed.  He was subsequently started on morphine drip.  He was transferred back to [facility] service from 09/01/2023 onwards.  Assessment & Plan:   Comfort measures only status Recurrent acute  upper GI bleeding/acute blood loss anemia Portal gastropathy, duodenal ulcer Worsening coagulopathy, thrombocytopenia, anemia despite transfusions and multiple interventions to control bleeding Decompensated cirrhosis of liver due to alcohol with anasarca, coagulopathy, thrombocytopenia, hyperammonemia, elevated LFTs, alcoholic hepatitis Acute respiratory failure with hypoxia Hypokalemia Acute kidney injury Severe protein energy malnutrition Hemorrhagic shock Acute encephalopathy, possibly combination of acute hepatic and metabolic encephalopathy  Plan -He was extubated on 08/31/2023 and after discussion with patient and family, patient decided to pursue comfort measures only.  He underwent paracentesis by PCCM on 08/31/2023 per patient's request and 2300 cc of fluid was removed.  He was subsequently started on morphine drip.  Continue comfort measures only status.  Expect in-hospital death.  DVT prophylaxis: None for comfort measures Code Status: DNR Family Communication: None at bedside Disposition Plan: Status is: Inpatient Remains inpatient appropriate because: Of severity of illness.    Consultants: PCCM/GI/IR  Procedures: As above  Antimicrobials: Rocephin  and Flagyl  from 08/26/2023-08/31/2023   Subjective: Patient seen and examined at bedside.  No acute distress.  Objective: Vitals:   09/01/23 0600 09/01/23 0700 09/01/23 0800 09/01/23 0900  BP:      Pulse:      Resp: 18 10 12 11   Temp:      TempSrc:      SpO2:   98%   Weight:  71.1 kg    Height:        Intake/Output Summary (Last 24 hours) at 09/01/2023 1139 Last data filed at 09/01/2023 1010 Gross per 24 hour  Intake 347.45 ml  Output 4688 ml  Net -4340.55 ml   Filed Weights   08/29/23 0417 08/30/23 0701 09/01/23 0700  Weight: 72.1 kg 72.4 kg 71.1 kg    Examination:  General  exam: Looks chronically ill and deconditioned.  No distress. Data Reviewed: I have personally reviewed following labs and imaging  studies  CBC: Recent Labs  Lab 08/26/23 0709 08/26/23 0954 08/28/23 1748 08/29/23 0220 08/29/23 1418 08/30/23 1034 08/30/23 2041 08/31/23 0545 08/31/23 1238  WBC 14.6*   < > 11.2* 12.4* 12.1* 7.3 8.4 9.0 10.3  NEUTROABS 11.8*  --  9.5* 10.5*  --   --   --   --   --   HGB 9.8*   < > 8.5* 8.4* 8.8* 7.4* 7.1* 6.7* 7.0*  HCT 27.9*   < > 23.9* 23.0* 25.3* 21.8* 21.4* 19.4* 20.6*  MCV 87.5   < > 86.9 86.5 89.7 92.0 93.4 92.8 92.4  PLT 111*   < > 93* 116* 112* 60* 55* 52* 49*   < > = values in this interval not displayed.   Basic Metabolic Panel: Recent Labs  Lab 08/27/23 0101 08/27/23 0856 08/28/23 0034 08/28/23 1223 08/29/23 0220 08/29/23 0324 08/29/23 1228 08/30/23 0814 08/30/23 0938 08/30/23 1749 08/31/23 0545  NA 138 137 139  --  140  --   --  139  --   --  143  K 3.7 3.6 2.4*  --  2.5*  --  4.2 3.3*  --   --  2.8*  CL 109 109 104  --  105  --   --  108  --   --  111  CO2 16* 18* 22  --  22  --   --  21*  --   --  20*  GLUCOSE 259* 255* 151*  --  121*  --   --  133*  --   --  118*  BUN 37* 39* 37*  --  36*  --   --  28*  --   --  29*  CREATININE 1.32* 1.12 1.27*  --  1.19  --   --  1.17  --   --  1.25*  CALCIUM 7.3* 7.3* 7.8*  --  7.7*  --   --  7.8*  --   --  8.5*  MG 1.5*  --   --  1.5*  --  1.8  --   --  1.9 2.0 1.9  PHOS 3.9  --   --   --   --  3.0  --   --  2.5 2.3* 2.0*   GFR: Estimated Creatinine Clearance: 50.7 mL/min (A) (by C-G formula based on SCr of 1.25 mg/dL (H)). Liver Function Tests: Recent Labs  Lab 08/27/23 0856 08/28/23 0034 08/29/23 0220 08/30/23 0814 08/31/23 0545  AST 270* 152* 131* 135* 107*  ALT 150* 96* 92* 87* 68*  ALKPHOS 50 44 61 63 74  BILITOT 14.7* 14.8* 16.2* 17.5* 16.8*  PROT 3.7* 4.5* 4.6* 4.6* 5.0*  ALBUMIN  1.6* 2.9* 2.5* 2.6* 3.0*   No results for input(s): "LIPASE", "AMYLASE" in the last 168 hours. Recent Labs  Lab 08/26/23 0937 08/31/23 0545  AMMONIA 90* 77*   Coagulation Profile: Recent Labs  Lab  08/27/23 0856 08/28/23 0034 08/29/23 0922 08/30/23 0626 08/31/23 0545  INR 2.5* 2.8* 2.3* 2.3* 2.8*   Cardiac Enzymes: No results for input(s): "CKTOTAL", "CKMB", "CKMBINDEX", "TROPONINI" in the last 168 hours. BNP (last 3 results) No results for input(s): "PROBNP" in the last 8760 hours. HbA1C: No results for input(s): "HGBA1C" in the last 72 hours. CBG: Recent Labs  Lab 08/30/23 2027 08/31/23 0005 08/31/23 0359 08/31/23 0725 08/31/23 1230  GLUCAP 154* 145*  129* 137* 150*   Lipid Profile: Recent Labs    08/30/23 0550  TRIG 211*   Thyroid Function Tests: No results for input(s): "TSH", "T4TOTAL", "FREET4", "T3FREE", "THYROIDAB" in the last 72 hours. Anemia Panel: No results for input(s): "VITAMINB12", "FOLATE", "FERRITIN", "TIBC", "IRON", "RETICCTPCT" in the last 72 hours. Sepsis Labs: Recent Labs  Lab 08/26/23 0937 08/27/23 0856  LATICACIDVEN 2.0* 3.7*    No results found for this or any previous visit (from the past 240 hours).       Radiology Studies: No results found.      Scheduled Meds:  Chlorhexidine  Gluconate Cloth  6 each Topical Daily   Continuous Infusions:  morphine 10 mg/hr (09/01/23 0945)          Audria Leather, MD [facility] 09/01/2023, 11:39 AM

## 2023-09-01 NOTE — Plan of Care (Signed)

## 2023-09-01 NOTE — Consult Note (Signed)
 Consultation Note Date: 09/01/2023   Patient Name: Kevin Velez  DOB: 03/02/1951  MRN: [id number]  Age / Sex: 73 y.o., male  PCP: Patient, No Pcp Per Referring Physician: Audria Leather, MD  Reason for Consultation:  goals of care. Comfort measures  HPI/Patient Profile: 73 y.o. male  with past medical history of alcoholic cirrhosis, DM2, HTN, GERD, recent admission for hemorrhagic shock due to GI bleeding, large duodenal ulcer dc'd to SNF, now admitted on 08/23/2023 with recurrent GI bleeding in setting alcoholic cirrhosis, impaired liver function. Again suffered from hemorrhagic shock requiring intubation and multiple transfusions, vasopressors. He was extubated on 5/14 and transitioned to comfort measures only by PCCM service. Palliative consulted today for assistance with comfort measures.    Primary Decision Maker NEXT OF KIN - initially patient, now his siblings  Discussion: Chart reviewed including labs, progress notes, imaging from this and previous encounters. Labs significant for elevated PT/INR (29.9/2.8) , anemia (hgb 7.0) , poor hepatic function (albumin  3.0, AST/ALT 107/68, bili 16.8) hyperammoniemia (77). PCCM note reviewed- patient made decision for comfort measures after he was extubated.  GI note reviewed- unfortunately patient with multiple barriers to recovery including thrombocytompenia, hypocoagulable state, nonhealing duodenal ulcer in setting of decompensated cirrhosis.  Medication use reviewed- patient on IV morphine infusion- required multiple boluses and uptitration of infusion for comfort overnight. Currently infusing at 5mg /hour. Appears comfortable.  Discussed plan of care with patient's sister who was at bedside.  Provided support as she shared patient's healthcare journey that was frustrating at times. Answered patient's sister and niece's questions related to his diagnosis and  options. Unfortunately, due to his advanced decompensated liver cirrhosis and his own expression of desire for comfort measures, no good options to restore him to functional state. However, there remains the possibility of ensuring his comfort and allowing him peaceful death.  Sister expressed her worries that morphine infusion was too high. She is accepting of current infusion rate.  Goals are for him to receive the best possible care with good symptom management until end of life.  SUMMARY OF RECOMMENDATIONS -Continue current comfort measures -Continue IV morphine infusion -IV lorazepam added for agitation/discomfort     Code Status/Advance Care Planning:   Code Status: Do not attempt resuscitation (DNR) - Comfort care    Prognosis:   Hours - Days  Discharge Planning: Anticipated Hospital Death  Primary Diagnoses: Present on Admission:  Acute lower GI bleeding  Protein-calorie malnutrition, severe  Liver failure (HCC)  Coagulopathy (HCC)  Acute on chronic blood loss anemia  Hypokalemia  Alcoholic cirrhosis of liver with ascites (HCC)  Leukocytosis  Ulcer, gastric, acute  Alcoholic hepatitis   Review of Systems  Unable to perform ROS: Mental status change    Physical Exam Vitals and nursing note reviewed.  Constitutional:      General: He is not in acute distress.    Appearance: He is cachectic. He is ill-appearing.  Cardiovascular:     Rate and Rhythm: Normal rate.  Pulmonary:     Effort: Pulmonary  effort is normal.  Abdominal:     General: There is distension.  Neurological:     Comments: unresponsive     Vital Signs: BP (!) 46/30 (BP Location: Left Arm)   Pulse (!) 109   Temp 97.8 F (36.6 C) (Oral)   Resp 13   Ht 5\' 7"  (1.702 m)   Wt 71.1 kg   SpO2 91%   BMI 24.55 kg/m  Pain Scale: 0-10   Pain Score: Asleep   SpO2: SpO2: 91 % O2 Device:SpO2: 91 % O2 Flow Rate: .O2 Flow Rate (L/min): 2 L/min  IO: Intake/output summary:  Intake/Output  Summary (Last 24 hours) at 09/01/2023 1502 Last data filed at 09/01/2023 1010 Gross per 24 hour  Intake 257.86 ml  Output 1488 ml  Net -1230.14 ml    LBM: Last BM Date : 09/01/23 Baseline Weight: Weight: 50.8 kg Most recent weight: Weight: 71.1 kg       Thank you for this consult. Palliative medicine will continue to follow and assist as needed.   Signed by: Micki Alas, AGNP-C Palliative Medicine  Time includes:   Preparing to see the patient (e.g., review of tests) Obtaining and/or reviewing separately obtained history Performing a medically necessary appropriate examination and/or evaluation Counseling and educating the patient/family/caregiver Ordering medications, tests, or procedures Referring and communicating with other health care professionals (when not reported separately) Documenting clinical information in the electronic or other health record Independently interpreting results (not reported separately) and communicating results to the patient/family/caregiver Care coordination (not reported separately) Clinical documentation   Please contact Palliative Medicine Team phone at [phone] for questions and concerns.  For individual provider: See Tilford Foley

## 2023-09-01 NOTE — Progress Notes (Signed)
-   Patient has transition to comfort care only.  Long discussion with patient's sister at bedside today.  Underlying issues of low platelet count, hypercoagulable state, nonhealing ulcer with ongoing bleeding as well as decompensated cirrhosis discussed.  She appreciated my time answering questions.  No further inpatient GI workup planned.  GI will sign off.  Call us  back if needed.  Felecia Hopper MD, FACP 09/01/2023, 1:29 PM  Contact #  [phone]

## 2023-09-01 NOTE — TOC Progression Note (Signed)
 Transition of Care [facility]) - Progression Note    Patient Details  Name: Kevin Velez MRN: [id number] Date of Birth: 04/23/1950  Transition of Care [facility]) CM/SW Contact  Levie Ream, RN Phone Number: 09/01/2023, 10:46 AM  Clinical Narrative:    Pt transitioned to comfort care; currently on morphine drip; TOC is signing off; please place consult if needed.   Expected Discharge Plan: Skilled Nursing Facility Barriers to Discharge: Continued Medical Work up  Expected Discharge Plan and Services In-house Referral: NA   Post Acute Care Choice: Skilled Nursing Facility Living arrangements for the past 2 months: Skilled Nursing Facility [name])                 DME Arranged: N/A DME Agency: NA       HH Arranged: NA HH Agency: NA         Social Determinants of Health (SDOH) Interventions SDOH Screenings   Food Insecurity: No Food Insecurity (08/23/2023)  Housing: Low Risk  (08/23/2023)  Transportation Needs: No Transportation Needs (08/23/2023)  Utilities: Not At Risk (08/23/2023)  Social Connections: Socially Isolated (08/23/2023)  Tobacco Use: High Risk (08/26/2023)    Readmission Risk Interventions    08/29/2023   12:12 PM 08/24/2023   10:39 AM 08/11/2023    2:18 PM  Readmission Risk Prevention Plan  Transportation Screening Complete Complete Complete  PCP or Specialist Appt within 3-5 Days  Complete   HRI or Home Care Consult  Complete Complete  Social Work Consult for Recovery Care Planning/Counseling  Complete Complete  Palliative Care Screening  Not Applicable Not Applicable  Medication Review [employer]ographer) Complete Complete Complete  PCP or Specialist appointment within 3-5 days of discharge Complete    HRI or Home Care Consult Complete    SW Recovery Care/Counseling Consult Complete    Palliative Care Screening Not Applicable    Skilled Nursing Facility Complete

## 2023-09-02 DIAGNOSIS — R578 Other shock: Secondary | ICD-10-CM

## 2023-09-18 NOTE — Progress Notes (Signed)
 This shift pts family inq as to why morphine was increased to 5 ml/hr from 2 ml/hr. They are requesting to have pain med decreased because pt seemed to be more lucid and attentive to family, with eyes open and conversating. Attending notified of dose change to 2 ml/hr, with the intent of reassesment to mk sure dosage was sufficient for pt's needs. Family have been notified and plan of care is ongoing

## 2023-09-18 NOTE — Progress Notes (Signed)
 Family was at bedside and notified the nurses station that patient was not breathing. This nurse verified death at 0914 with [place] RN. MD notified.

## 2023-09-18 NOTE — Death Summary Note (Signed)
 Death Summary  Kevin Velez KGM:[id number] DOB: Sep 13, 1950 DOA: 2023-09-18  PCP: Patient, No Pcp Per  Admit date: 09/18/2023 Date of Death: 28-Sep-2023 Time of Death: 0914   History of present illness:  73 year old male with history of alcohol abuse, diabetes mellitus type 2, hypertension, GERD with recent admission from 08/08/2023-08/20/2023 for possible hemorrhagic shock/GI bleeding along with a diagnosis of cirrhosis of liver/acute hepatic failure requiring intubation and subsequent extubation, multiple back red cells, platelets, FFP transfusion with EGD on 08/14/2023 showing large duodenal ulcer, visible vessel with active bleeding treated with epinephrine  injection with repeat CT of abdomen showing no further bleeding and subsequent discharge to SNF. He presented again on 2023/09/18 with bloody stools with clots along with melanotic stools. CT angio showed active high-volume upper GI bleeding from duodenal bulb. Hemoglobin was 8.1 on presentation. Bilirubin 17.1. He underwent mesenteric angiogram and gastroduodenal artery embolization by IR. GI was also consulted. He developed recurrent bleeding and hypotension on 08/25/2023 with drop in hemoglobin requiring packed red cell transfusion. PCCM was consulted. He underwent EGD on 08/26/2023 which showed mild portal gastropathy, nonbleeding cratered small duodenal ulcer, clot at bulb of the abdomen. Required significant transfusions along with vasopressors. CODE STATUS changed to DNR after discussion with brother. He remained intubated. He continued to have bloody bowel movements needing transfusion. He also required Precedex. He was extubated on 08/31/2023 and after discussion with patient and family, patient decided to pursue comfort measures only. He underwent paracentesis by PCCM on 08/31/2023 per patient's request and 2300 cc of fluid was removed. He was subsequently started on morphine drip. He was transferred back to [facility] service from 09/01/2023 onwards.  He  expired on 09/01/2023 at 0914.  Family was at bedside.  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    Significant Diagnostic Studies: DG Chest Port 1 View Result Date: 08/30/2023 CLINICAL DATA:  Respirator dependence EXAM: PORTABLE CHEST 1 VIEW COMPARISON:  Chest radiograph dated 08/29/2023 FINDINGS: Lines/tubes: Endotracheal tube has been retracted and tip projects 3.0 cm above the carina. Enteric tube tip reaches the diaphragm and terminates below the field of view. Metal tipped esophageal temperature probe terminates over the mid esophagus. Lungs: Low lung volumes with bronchovascular crowding. Similar bilateral lower lung opacities. Pleura: Similar moderate left and trace right pleural effusions. No pneumothorax. Heart/mediastinum: Similar  cardiomediastinal silhouette. Bones: No acute osseous abnormality. IMPRESSION: 1. Endotracheal tube has been retracted and tip projects 3.0 cm above the carina. 2. Similar bilateral lower lung opacities and moderate left and trace right pleural effusions. Electronically Signed   By: Limin  Xu M.D.   On: 08/30/2023 14:57   DG Chest Port 1 View Result Date: 08/29/2023 CLINICAL DATA:  Respiratory dependency EXAM: PORTABLE CHEST 1 VIEW  COMPARISON:  Aug 26, 2023 FINDINGS: Tip of the endotracheal tube remains within the carina preferential orientation in the right mainstem bronchus should be pulled back at least 1-1/2 cm above carina Small bilateral pleural effusion with basilar atelectasis without consolidations. Heart and mediastinum normal  Nasogastric tube in the stomach IMPRESSION: Tip of the endotracheal tube remains within the carina preferential orientation in the right mainstem bronchus should be pulled back at least 1-1/2 cm above carina. Electronically Signed   By: Fredrich Jefferson M.D.   On: 08/29/2023 14:19   CT ANGIO GI BLEED Result Date: 08/26/2023 CLINICAL DATA:  Rectal bleeding and history of duodenal ulcer with prior active bleeding. Status post transcatheter embolization of the gastroduodenal artery on 08/23/2023. Upper endoscopy earlier today did not show evidence of active bleeding from the duodenum. EXAM: CTA ABDOMEN AND PELVIS WITHOUT AND WITH CONTRAST TECHNIQUE: Multidetector CT imaging of the abdomen and pelvis was performed using the standard protocol during bolus administration of intravenous contrast. Multiplanar reconstructed images and MIPs were obtained and reviewed to evaluate the vascular anatomy. RADIATION DOSE REDUCTION: This exam was performed according to the departmental dose-optimization program which includes automated exposure control, adjustment of the mA and/or kV according to patient size and/or use of iterative reconstruction technique. CONTRAST:  OMNIPAQUE  IOHEXOL  350 MG/ML SOLN COMPARISON:  Prior CTA on 08/23/2023 and arteriography and embolization also on 08/23/2023 FINDINGS: VASCULAR Aorta: Normally patent. Stable minimal atherosclerosis of the distal abdominal aorta. No aneurysm or dissection. Celiac: Normally patent. Normally patent branch vessels and branching anatomy. SMA: Normally patent. Renals: Normally patent bilateral single renal arteries. IMA: Normally patent. Inflow: Normally patent  bilateral iliac arteries. Proximal Outflow: Normally patent bilateral common femoral arteries and femoral bifurcations. Veins: Venous phase imaging demonstrates normally patent venous structures in the abdomen and pelvis. A left femoral central venous catheter is present with the catheter tip extending up into the lower IVC. Review of the MIP images confirms the above findings. NON-VASCULAR Lower chest: Bilateral pleural fluid at the visualized lung bases, left greater than right. Hepatobiliary: Cirrhosis of the liver. Vicarious excretion of contrast into the gallbladder. No biliary ductal dilatation. Pancreas: Unremarkable. No pancreatic ductal dilatation or surrounding inflammatory changes. Spleen: Normal in size without focal abnormality. Adrenals/Urinary Tract: No adrenal masses. Stable appearance of both kidneys with bilateral [place] 1 cysts. Unremarkable bladder containing excreted contrast. Stomach/Bowel: No significant active gastrointestinal tract bleeding identified on arterial or venous phases. There is a tiny focus of linear enhancement posterior to the inferior aspect of the gallbladder and near the anterior and superior margin of the proximal duodenum measuring approximately 4 mm in diameter and 7 mm in length. This does not accumulate into a significant region of pooling of contrast on the venous phase. This could represent a tiny focus of bleeding or an aberrant vessel that has become more visible after embolization of the GDA due to altered flow. Nasogastric tube extends into the gastric lumen. There is no evidence perforated gastric or duodenal ulcer. No small bowel dilatation. The colon demonstrates some progressive edema and wall thickening since the prior CTA suggestive of diffuse colitis. No evidence of free intraperitoneal air. Lymphatic: No enlarged abdominal or pelvic lymph nodes. Reproductive: Prostate is unremarkable. Other: Moderate volume ascites in the peritoneal cavity is stable.  Musculoskeletal: No acute or significant osseous findings. IMPRESSION: 1. No significant active gastrointestinal tract bleeding identified on arterial or venous phases. There is a tiny focus of linear enhancement posterior to the inferior aspect of the gallbladder and near the anterior and superior margin of the proximal duodenum measuring approximately 4 mm in diameter and 7 mm in length. This does not accumulate into a significant region of pooling of contrast on the venous phase. This could represent a tiny focus of  bleeding or an aberrant vessel that has become more visible after embolization of the GDA due to altered flow. 2. Progressive edema and wall thickening of the colon since the prior CTA suggestive of diffuse colitis. No evidence of free intraperitoneal air. 3. Cirrhosis of the liver with moderate volume ascites in the peritoneal cavity, stable. 4. Bilateral pleural fluid at the visualized lung bases, left greater than right. 5. Left femoral central venous catheter tip extends up into the lower IVC. Electronically Signed   By: Erica Hau M.D.   On: 08/26/2023 17:24   DG Abd 1 View Result Date: 08/26/2023 CLINICAL DATA:  [id number] Encounter for orogastric (OG) tube placement [id number] EXAM: ABDOMEN - 1 VIEW COMPARISON:  Aug 23, 2023 FINDINGS: Esophagogastric tube terminates in the left upper quadrant, in the region of the stomach. Epigastric vascular coil packing, predominantly within the GDA territory. Generalized paucity of small bowel gas throughout the central abdomen. Gaseous distension of the colon. Excreted contrast into the bladder. Findings consistent with underlying ascites. IMPRESSION: Esophagogastric tube terminates in the left upper quadrant in the region of the stomach, which is completely decompressed. Electronically Signed   By: Rance Burrows M.D.   On: 08/26/2023 14:38   DG CHEST PORT 1 VIEW Result Date: 08/26/2023 CLINICAL DATA:  Endotracheal tube. EXAM: PORTABLE CHEST 1 VIEW  COMPARISON:  August 08, 2023. FINDINGS: The heart size and mediastinal contours are within normal limits. Endotracheal tube is in grossly good position. Moderate size left pleural effusion is noted with associated atelectasis. Right lung is clear. The visualized skeletal structures are unremarkable. IMPRESSION: Endotracheal tube in grossly good position. Moderate size left pleural effusion with associated atelectasis. Electronically Signed   By: Rosalene Colon M.D.   On: 08/26/2023 12:27   IR EMBO ART  VEN HEMORR LYMPH EXTRAV  INC GUIDE ROADMAPPING Result Date: 08/23/2023 INDICATION: 73 year old male with history of acute gastrointestinal hemorrhage and history of duodenal ulcer with CTA evidence of active extravasation from the proximal duodenum. EXAM: 1. Ultrasound-guided vascular access of the right common femoral artery. 2. Selective catheterization and angiography of the celiac artery, gastroduodenal artery, right hepatic artery, and superior mesenteric artery. 3. Coil embolization of the gastroduodenal artery. MEDICATIONS: None. ANESTHESIA/SEDATION: Moderate (conscious) sedation was employed during this procedure. A total of Versed  2 mg and Fentanyl  100 mcg was administered intravenously. Moderate Sedation Time: 56 minutes. The patient's level of consciousness and vital signs were monitored continuously by radiology nursing throughout the procedure under my direct supervision. CONTRAST:  84mL OMNIPAQUE  IOHEXOL  300 MG/ML SOLN, 25mL OMNIPAQUE  IOHEXOL  350 MG/ML SOLN FLUOROSCOPY: Radiation Exposure Index (as provided by the fluoroscopic device): 1,742 mGy Kerma COMPLICATIONS: None immediate. PROCEDURE: Informed consent was obtained from the patient following explanation of the procedure, risks, benefits and alternatives. The patient understands, agrees and consents for the procedure. All questions were addressed. A time out was performed prior to the initiation of the procedure. Maximal barrier sterile technique  utilized including caps, mask, sterile gowns, sterile gloves, large sterile drape, hand hygiene, and Betadine prep. Preprocedure ultrasound evaluation of the right common femoral artery demonstrated patency. The procedure was planned. Subdermal Local anesthesia was administered at the planned needle entry site with 1% lidocaine . A small skin nick was made. Under direct ultrasound visualization, a 21 gauge micropuncture needle was introduced in the right common femoral artery. A permanent ultrasound image was captured and stored in the record. A micropuncture sheath was introduced. Limited right lower extremity angiogram was performed which demonstrated adequate  puncture site for closure device use. A Bentson wire was introduced and the micropuncture sheath was exchanged for a 5 [place] vascular sheath. A C2 catheter was advanced over the Bentson wire and directed to the level of the celiac trunk. Celiac angiogram was performed which demonstrated conventional anatomy and patency. Using a 2.4 [place] Progreat microcatheter and fathom 16 microwire the gastroduodenal artery was selected. The catheter was able to be advanced into the pancreatic duodenal arcade and gastroepiploic artery. Coil embolization was then performed about the distal gastroduodenal artery with multiple detachable low profile Ruby coils. These coils were seen to migrate into the gastroepiploic artery. The catheter was retracted further an additional Ruby microcoils were deployed to the level of the ostium of the gastroduodenal artery. Attempts were made at catheterization of the cystic artery which were unsuccessful. Angiogram was performed from the base celiac catheter which demonstrated complete embolization of the gastroduodenal artery without evidence of hemorrhage in the region of the proximal duodenum. The catheter was repositioned to the superior mesenteric artery. Angiogram was performed which demonstrated patency of the main superior  mesenteric artery and its proximal branches. No evidence of active extravasation of the region of the duodenum. The catheter was removed. The sheath was exchanged for a 6 French Angio-Seal device which was deployed without complication. Peripheral pulses were unchanged. A sterile bandage was applied. The patient tolerated the procedure well was transferred back to the emergency room in good condition. IMPRESSION: Technically successful coil embolization of the gastroduodenal artery. No active extravasation was seen on angiography. Creasie Doctor, MD [facility] Radiology Electronically Signed   By: Creasie Doctor M.D.   On: 08/23/2023 16:14   IR Angiogram Visceral Selective Result Date: 08/23/2023 INDICATION: 73 year old male with history of acute gastrointestinal hemorrhage and history of duodenal ulcer with CTA evidence of active extravasation from the proximal duodenum. EXAM: 1. Ultrasound-guided vascular access of the right common femoral artery. 2. Selective catheterization and angiography of the celiac artery, gastroduodenal artery, right hepatic artery, and superior mesenteric artery. 3. Coil embolization of the gastroduodenal artery. MEDICATIONS: None. ANESTHESIA/SEDATION: Moderate (conscious) sedation was employed during this procedure. A total of Versed  2 mg and Fentanyl  100 mcg was administered intravenously. Moderate Sedation Time: 56 minutes. The patient's level of consciousness and vital signs were monitored continuously by radiology nursing throughout the procedure under my direct supervision. CONTRAST:  84mL OMNIPAQUE  IOHEXOL  300 MG/ML SOLN, 25mL OMNIPAQUE  IOHEXOL  350 MG/ML SOLN FLUOROSCOPY: Radiation Exposure Index (as provided by the fluoroscopic device): 1,742 mGy Kerma COMPLICATIONS: None immediate. PROCEDURE: Informed consent was obtained from the patient following explanation of the procedure, risks, benefits and alternatives. The patient  understands, agrees and consents for the procedure. All questions were addressed. A time out was performed prior to the initiation of the procedure. Maximal barrier sterile technique utilized including caps, mask, sterile gowns, sterile gloves, large sterile drape, hand hygiene, and Betadine prep. Preprocedure ultrasound evaluation of the right common femoral artery demonstrated patency. The procedure was planned. Subdermal Local anesthesia was administered at the planned needle entry site with 1% lidocaine . A small skin nick was made. Under direct ultrasound visualization, a 21 gauge micropuncture needle was introduced in the right common femoral artery. A permanent ultrasound image was captured and stored in the record. A micropuncture sheath was introduced. Limited right lower extremity angiogram was performed which demonstrated adequate puncture site for closure device use. A Bentson wire was introduced and the micropuncture sheath was exchanged for a 5 [place]  vascular sheath. A C2 catheter was advanced over the Bentson wire and directed to the level of the celiac trunk. Celiac angiogram was performed which demonstrated conventional anatomy and patency. Using a 2.4 [place] Progreat microcatheter and fathom 16 microwire the gastroduodenal artery was selected. The catheter was able to be advanced into the pancreatic duodenal arcade and gastroepiploic artery. Coil embolization was then performed about the distal gastroduodenal artery with multiple detachable low profile Ruby coils. These coils were seen to migrate into the gastroepiploic artery. The catheter was retracted further an additional Ruby microcoils were deployed to the level of the ostium of the gastroduodenal artery. Attempts were made at catheterization of the cystic artery which were unsuccessful. Angiogram was performed from the base celiac catheter which demonstrated complete embolization of the gastroduodenal artery without evidence of hemorrhage in  the region of the proximal duodenum. The catheter was repositioned to the superior mesenteric artery. Angiogram was performed which demonstrated patency of the main superior mesenteric artery and its proximal branches. No evidence of active extravasation of the region of the duodenum. The catheter was removed. The sheath was exchanged for a 6 French Angio-Seal device which was deployed without complication. Peripheral pulses were unchanged. A sterile bandage was applied. The patient tolerated the procedure well was transferred back to the emergency room in good condition. IMPRESSION: Technically successful coil embolization of the gastroduodenal artery. No active extravasation was seen on angiography. Creasie Doctor, MD [facility] Radiology Electronically Signed   By: Creasie Doctor M.D.   On: 08/23/2023 16:14   IR US  Guide Vasc Access Right Result Date: 08/23/2023 INDICATION: 73 year old male with history of acute gastrointestinal hemorrhage and history of duodenal ulcer with CTA evidence of active extravasation from the proximal duodenum. EXAM: 1. Ultrasound-guided vascular access of the right common femoral artery. 2. Selective catheterization and angiography of the celiac artery, gastroduodenal artery, right hepatic artery, and superior mesenteric artery. 3. Coil embolization of the gastroduodenal artery. MEDICATIONS: None. ANESTHESIA/SEDATION: Moderate (conscious) sedation was employed during this procedure. A total of Versed  2 mg and Fentanyl  100 mcg was administered intravenously. Moderate Sedation Time: 56 minutes. The patient's level of consciousness and vital signs were monitored continuously by radiology nursing throughout the procedure under my direct supervision. CONTRAST:  84mL OMNIPAQUE  IOHEXOL  300 MG/ML SOLN, 25mL OMNIPAQUE  IOHEXOL  350 MG/ML SOLN FLUOROSCOPY: Radiation Exposure Index (as provided by the fluoroscopic device): 1,742 mGy Kerma  COMPLICATIONS: None immediate. PROCEDURE: Informed consent was obtained from the patient following explanation of the procedure, risks, benefits and alternatives. The patient understands, agrees and consents for the procedure. All questions were addressed. A time out was performed prior to the initiation of the procedure. Maximal barrier sterile technique utilized including caps, mask, sterile gowns, sterile gloves, large sterile drape, hand hygiene, and Betadine prep. Preprocedure ultrasound evaluation of the right common femoral artery demonstrated patency. The procedure was planned. Subdermal Local anesthesia was administered at the planned needle entry site with 1% lidocaine . A small skin nick was made. Under direct ultrasound visualization, a 21 gauge micropuncture needle was introduced in the right common femoral artery. A permanent ultrasound image was captured and stored in the record. A micropuncture sheath was introduced. Limited right lower extremity angiogram was performed which demonstrated adequate puncture site for closure device use. A Bentson wire was introduced and the micropuncture sheath was exchanged for a 5 [place] vascular sheath. A C2 catheter was advanced over the Bentson wire and directed to the level of the celiac  trunk. Celiac angiogram was performed which demonstrated conventional anatomy and patency. Using a 2.4 [place] Progreat microcatheter and fathom 16 microwire the gastroduodenal artery was selected. The catheter was able to be advanced into the pancreatic duodenal arcade and gastroepiploic artery. Coil embolization was then performed about the distal gastroduodenal artery with multiple detachable low profile Ruby coils. These coils were seen to migrate into the gastroepiploic artery. The catheter was retracted further an additional Ruby microcoils were deployed to the level of the ostium of the gastroduodenal artery. Attempts were made at catheterization of the cystic artery which  were unsuccessful. Angiogram was performed from the base celiac catheter which demonstrated complete embolization of the gastroduodenal artery without evidence of hemorrhage in the region of the proximal duodenum. The catheter was repositioned to the superior mesenteric artery. Angiogram was performed which demonstrated patency of the main superior mesenteric artery and its proximal branches. No evidence of active extravasation of the region of the duodenum. The catheter was removed. The sheath was exchanged for a 6 French Angio-Seal device which was deployed without complication. Peripheral pulses were unchanged. A sterile bandage was applied. The patient tolerated the procedure well was transferred back to the emergency room in good condition. IMPRESSION: Technically successful coil embolization of the gastroduodenal artery. No active extravasation was seen on angiography. Creasie Doctor, MD [facility] Radiology Electronically Signed   By: Creasie Doctor M.D.   On: 08/23/2023 16:14   CT ANGIO GI BLEED Result Date: 08/23/2023 CLINICAL DATA:  Duodenal ulcer melena, active bleeding. EXAM: CTA ABDOMEN AND PELVIS WITHOUT AND WITH CONTRAST TECHNIQUE: Multidetector CT imaging of the abdomen and pelvis was performed using the standard protocol during bolus administration of intravenous contrast. Multiplanar reconstructed images and MIPs were obtained and reviewed to evaluate the vascular anatomy. RADIATION DOSE REDUCTION: This exam was performed according to the departmental dose-optimization program which includes automated exposure control, adjustment of the mA and/or kV according to patient size and/or use of iterative reconstruction technique. CONTRAST:  OMNIPAQUE  IOHEXOL  350 MG/ML SOLN COMPARISON:  CT angiography abdomen and pelvis from 08/15/2023. FINDINGS: VASCULAR Aorta: Normal caliber aorta without aneurysm, dissection, vasculitis or significant stenosis.  Celiac: Patent without evidence of aneurysm, dissection, vasculitis or significant stenosis. There is a small branch arising from the common hepatic artery which extends up to the duodenal bulb region which is associated with active extravasation of contrast with contras blush measuring up to 1.1 x 1.9 cm on arterial phase images, which slightly increases in size on the portal venous phase images and markedly increases on the delayed images with extension up to the duodeno-jejunal junction region, compatible with source of high volume active GI bleeding. SMA: Patent without evidence of aneurysm, dissection, vasculitis or significant stenosis. Renals: Both renal arteries are patent without evidence of aneurysm, dissection, vasculitis, fibromuscular dysplasia or significant stenosis. IMA: Patent without evidence of aneurysm, dissection, vasculitis or significant stenosis. Inflow: Patent without evidence of aneurysm, dissection, vasculitis or significant stenosis. Proximal Outflow: Bilateral common femoral and visualized portions of the superficial and profunda femoral arteries are patent without evidence of aneurysm, dissection, vasculitis or significant stenosis. Veins: No obvious venous abnormality within the limitations of this arterial phase study. Review of the MIP images confirms the above findings. NON-VASCULAR Lower chest: There is small left pleural effusion with associated compressive changes at the left lung base. There is trace right pleural effusion. Bilateral lung bases are otherwise clear. Normal cardiac size. No pericardial effusion. There is apparent hypoattenuation  of the blood pool relative to the myocardium, suggestive of anemia. Hepatobiliary: The liver is normal in size. There is liver surface irregularity/nodularity, compatible with cirrhotic configuration. No intrahepatic or extrahepatic bile duct dilation. There are layering hyperattenuating areas in the gallbladder, likely vicarious excretion  of previously administered intravenous contrast. No abnormal wall thickening or pericholecystic fat stranding. No imaging evidence of acute cholecystitis. Pancreas: Unremarkable. No pancreatic ductal dilatation or surrounding inflammatory changes. Spleen: Within normal limits. No focal lesion. Adrenals/Urinary Tract: Adrenal glands are unremarkable. No suspicious renal mass. There are several simple cysts throughout bilateral kidneys with largest measuring up to 2.3 x 3.3 cm arising from the right kidney interpolar region, laterally. No hydroureteronephrosis or nephroureterolithiasis. Unremarkable urinary bladder. Stomach/Bowel: No disproportionate dilation of the small or large bowel loops. No evidence of abnormal bowel wall thickening or inflammatory changes. The appendix is unremarkable. There are multiple diverticula throughout the colon, without imaging signs of diverticulitis. Vascular/Lymphatic: There is moderate to large ascites. No pneumoperitoneum. No abdominal or pelvic lymphadenopathy, by size criteria. No aneurysmal dilation of the major abdominal arteries. There are mild peripheral atherosclerotic vascular calcifications of the aorta and its major branches. Reproductive: Normal size prostate. Symmetric seminal vesicles. Other: Moderate anasarca. The soft tissues and abdominal wall are otherwise unremarkable. Musculoskeletal: No suspicious osseous lesions. There are mild - moderate multilevel degenerative changes in the visualized spine. IMPRESSION: 1. Active high volume upper GI bleeding from duodenal bulb region, as described above. Emergent GI or interventional radiology consultation is recommended. 2. Small left pleural effusion and trace right pleural effusion. 3. Cirrhotic liver configuration and moderate-to-large ascites. 4. Multiple other nonacute observations, as described above. Aortic Atherosclerosis (ICD10-I70.0). Critical Value/emergent results were called by telephone at the time of  interpretation on 08/23/2023 at 10:48 am to provider [facility] , who verbally acknowledged these results. Electronically Signed   By: Beula Brunswick M.D.   On: 08/23/2023 10:49   CT Angio Abd/Pel w/ and/or w/o Result Date: 08/15/2023 CLINICAL DATA:  Lower gastrointestinal hemorrhage EXAM: CT ANGIOGRAPHY ABDOMEN AND PELVIS WITH CONTRAST AND WITHOUT CONTRAST TECHNIQUE: Multidetector CT imaging of the abdomen and pelvis was performed using the standard protocol during bolus administration of intravenous contrast. Multiplanar reconstructed images and MIPs were obtained and reviewed to evaluate the vascular anatomy. RADIATION DOSE REDUCTION: This exam was performed according to the departmental dose-optimization program which includes automated exposure control, adjustment of the mA and/or kV according to patient size and/or use of iterative reconstruction technique. CONTRAST:  OMNIPAQUE  IOHEXOL  350 MG/ML SOLN COMPARISON:  08/10/2023 FINDINGS: VASCULAR Aorta: Normal caliber aorta without aneurysm, dissection, vasculitis or significant stenosis. Mild atherosclerotic calcification Celiac: Patent without evidence of aneurysm, dissection, vasculitis or significant stenosis. SMA: Patent without evidence of aneurysm, dissection, vasculitis or significant stenosis. Renals: Single renal arteries are seen bilaterally demonstrating wide patency. There is a beaded appearance involving the mid segment of the renal arteries bilaterally in keeping with changes of fibromuscular dysplasia. No dissection or aneurysm. IMA: Patent without evidence of aneurysm, dissection, vasculitis or significant stenosis. Inflow: Patent without evidence of aneurysm, dissection, vasculitis or significant stenosis. Proximal Outflow: Bilateral common femoral and visualized portions of the superficial and profunda femoral arteries are patent without evidence of aneurysm, dissection, vasculitis or significant stenosis. Veins: No obvious venous  abnormality within the limitations of this arterial phase study. Review of the MIP images confirms the above findings. NON-VASCULAR Lower chest: Trace right and small left pleural effusions with associated bibasilar atelectasis. Mild emphysema. No acute abnormality.  Hepatobiliary: Mildly nodular liver contour suggests changes of underlying cirrhosis. Superimposed mild hepatic steatosis. No enhancing intrahepatic mass. No intra or extrahepatic biliary ductal dilation. Cholelithiasis noted without superimposed pericholecystic inflammatory change. Pancreas: Unremarkable Spleen: Unremarkable Adrenals/Urinary Tract: Adrenal glands are unremarkable. Simple cortical cysts are seen within the kidneys bilaterally for which no follow-up imaging is recommended. The kidneys are otherwise unremarkable. Bladder unremarkable. Stomach/Bowel: Stable mild ascites. No active gastrointestinal hemorrhage. Moderate ascending and descending colonic diverticulosis. Stomach, small bowel, and large are otherwise unremarkable. Appendix normal. No free intraperitoneal gas. Lymphatic: No pathologic adenopathy within the abdomen and pelvis. Reproductive: Prostate is unremarkable. Other: Moderate, progressive subcutaneous body wall edema, best appreciated within flanks bilaterally. Mild retroperitoneal edema again noted diffusely. Musculoskeletal: No acute bone abnormality. No lytic or blastic bone lesion. Osseous structures are age appropriate. IMPRESSION: 1. No active gastrointestinal hemorrhage. 2. Beaded appearance of the renal arteries bilaterally in keeping with changes of fibromuscular dysplasia. 3. Progressive anasarca with progressive subcutaneous body wall edema, mild retroperitoneal edema, and small left and trace right pleural effusions. Mild ascites may reflect combination anasarca as well as portal venous hypertension. 4. Suspected cirrhosis. Superimposed hepatic steatosis. If indicated, this could be further assessed with hepatic  elastography or trans venous tissue sampling. 5. Cholelithiasis. 6. Moderate ascending and descending colonic diverticulosis. Aortic Atherosclerosis (ICD10-I70.0). Electronically Signed   By: Worthy Heads M.D.   On: 08/15/2023 01:42   US  EKG SITE RITE Result Date: 08/14/2023 If Site Rite image not attached, placement could not be confirmed due to current cardiac rhythm.  DG Abd 1 View Result Date: 08/10/2023 CLINICAL DATA:  Abdominal pain EXAM: ABDOMEN - 1 VIEW COMPARISON:  October 03, 2009 FINDINGS: Nonobstructive bowel gas pattern. Enteric contrast in the proximal colon and rectum. Temperature probe overlying the pelvis. No pneumoperitoneum. No organomegaly or radiopaque calculi. No acute fracture or destructive lesion. Multilevel degenerative disc disease of the spine. Streaky atelectasis in the lung bases. IMPRESSION: Nonobstructive bowel gas pattern. Electronically Signed   By: Rance Burrows M.D.   On: 08/10/2023 16:29   CT ABDOMEN PELVIS WO CONTRAST Result Date: 08/10/2023 CLINICAL DATA:  Increasing weakness. EXAM: CT ABDOMEN AND PELVIS WITHOUT CONTRAST TECHNIQUE: Multidetector CT imaging of the abdomen and pelvis was performed following the standard protocol without IV contrast. RADIATION DOSE REDUCTION: This exam was performed according to the departmental dose-optimization program which includes automated exposure control, adjustment of the mA and/or kV according to patient size and/or use of iterative reconstruction technique. COMPARISON:  None Available. FINDINGS: Lower chest: Mild to moderate severity posterior right basilar atelectasis and/or infiltrate is seen. Small bilateral pleural effusions are noted. Hepatobiliary: The liver is cirrhotic in appearance. A tiny gallstone is seen within the dependent portion of a mildly distended gallbladder. There is no evidence of gallbladder wall thickening, pericholecystic inflammation or biliary dilatation. Pancreas: Ill-defined and linear inflammatory  fat stranding is seen along the posterior aspect of the tail of the pancreas and, to a lesser degree, along the medial aspect of the pancreatic head. Moderate to marked severity bilateral retro mesenteric fat stranding is also seen extending along the anterior aspects of both kidneys into the lower abdomen. The pancreatic duct measures approximately 4.3 mm in diameter. Spleen: Normal in size without focal abnormality. Adrenals/Urinary Tract: Adrenal glands are unremarkable. Kidneys are normal in size, without renal calculi or hydronephrosis. Bilateral renal cysts are seen. A Foley catheter is seen within a predominantly empty urinary bladder. Stomach/Bowel: A properly position nasogastric tube is in place. Stomach  is within normal limits. Appendix appears normal. No evidence of bowel dilatation. The cecum and ascending colon are mildly thickened and inflamed. Numerous noninflamed diverticula are seen throughout the large bowel. Vascular/Lymphatic: Aortic atherosclerosis. A venous catheter is seen entering via the right groin. No enlarged abdominal or pelvic lymph nodes. Reproductive: The prostate gland is mildly enlarged. Other: No abdominal wall hernia or abnormality. There is a moderate amount of abdominopelvic ascites. Musculoskeletal: No acute or significant osseous findings. IMPRESSION: 1. Extensive retro-mesenteric inflammation which may represent sequelae associated with retroperitoneal fasciitis. Alternate inflammatory processes such as acute pancreatitis cannot be excluded. 2. Mild colitis involving the cecum and ascending colon. 3. Colonic diverticulosis. 4. Moderate amount of abdominopelvic ascites. 5. Mild to moderate severity posterior right basilar atelectasis and/or infiltrate. 6. Small bilateral pleural effusions. 7. Bilateral renal cysts. No follow-up imaging is recommended. This recommendation follows ACR consensus guidelines: Management of the Incidental Renal Mass on CT: A White Paper of the ACR  Incidental Findings Committee. J Am Coll Radiol [phone]. Electronically Signed   By: Virgle Grime M.D.   On: 08/10/2023 03:07   ECHOCARDIOGRAM COMPLETE Result Date: 08/09/2023    ECHOCARDIOGRAM REPORT   Patient Name:   Kevin Velez Date of Exam: 08/09/2023 Medical Rec #:  [id number]       Height:       69.0 in Accession #:    [id number]      Weight:       113.8 lb Date of Birth:  06-26-1950        BSA:          1.625 m Patient Age:    73 years        BP:           110/56 mmHg Patient Gender: M               HR:           98 bpm. Exam Location:  Inpatient Procedure: 2D Echo, Cardiac Doppler, Color Doppler and Intracardiac            Opacification Agent (Both Spectral and Color Flow Doppler were            utilized during procedure). Indications:    Liver Failure  History:        Patient has no prior history of Echocardiogram examinations.  Sonographer:    Andrena Bang Referring Phys: [phone] MATTHEW R HUNSUCKER  Sonographer Comments: Technically difficult study due to poor echo windows. IMPRESSIONS  1. Left ventricular ejection fraction, by estimation, is >75%. The left ventricle has hyperdynamic function. The left ventricle has no regional wall motion abnormalities. Left ventricular diastolic parameters were normal.  2. Right ventricular systolic function is normal. The right ventricular size is grossly normal.  3. Right atrial size was grossly normal.  4. The mitral valve is grossly normal. Mild mitral valve regurgitation. No evidence of mitral stenosis.  5. The aortic valve was not well visualized. Aortic valve regurgitation is not visualized. No aortic stenosis is present.  6. The inferior vena cava is normal in size with <50% respiratory variability, suggesting right atrial pressure of 8 mmHg. Comparison(s): No prior Echocardiogram. FINDINGS  Left Ventricle: Left ventricular ejection fraction, by estimation, is >75%. The left ventricle has hyperdynamic function. The left ventricle has no regional  wall motion abnormalities. Definity  contrast agent was given IV to delineate the left ventricular endocardial borders. The left ventricular internal cavity size was normal in size. Suboptimal image quality  limits for assessment of left ventricular hypertrophy. Left ventricular diastolic parameters were normal. Right Ventricle: The right ventricular size is grossly normal. Right vetricular wall thickness was not well visualized. Right ventricular systolic function is normal. Left Atrium: Left atrial size was normal in size. Right Atrium: Right atrial size was grossly normal. Pericardium: There is no evidence of pericardial effusion. Mitral Valve: The mitral valve is grossly normal. Mild mitral valve regurgitation. No evidence of mitral valve stenosis. Tricuspid Valve: The tricuspid valve is not well visualized. Tricuspid valve regurgitation is trivial. No evidence of tricuspid stenosis. Aortic Valve: The aortic valve was not well visualized. Aortic valve regurgitation is not visualized. No aortic stenosis is present. Aortic valve mean gradient measures 4.0 mmHg. Aortic valve peak gradient measures 11.3 mmHg. Aortic valve area, by VTI measures 2.09 cm. Pulmonic Valve: The pulmonic valve was not well visualized. Pulmonic valve regurgitation is not visualized. No evidence of pulmonic stenosis. Aorta: Aortic root could not be assessed. Venous: The inferior vena cava is normal in size with less than 50% respiratory variability, suggesting right atrial pressure of 8 mmHg. IAS/Shunts: The interatrial septum was not well visualized.  LEFT VENTRICLE PLAX 2D LVOT diam:     1.70 cm     Diastology LV SV:         45          LV e' medial:    7.72 cm/s LV SV Index:   28          LV E/e' medial:  8.4 LVOT Area:     2.27 cm    LV e' lateral:   10.80 cm/s                            LV E/e' lateral: 6.0  LV Volumes (MOD) LV vol d, MOD A2C: 89.6 ml LV vol d, MOD A4C: 95.6 ml LV vol s, MOD A2C: 15.8 ml LV vol s, MOD A4C: 20.8 ml LV SV  MOD A2C:     73.8 ml LV SV MOD A4C:     95.6 ml LV SV MOD BP:      76.7 ml RIGHT VENTRICLE RV S prime:     16.50 cm/s TAPSE (M-mode): 1.9 cm LEFT ATRIUM             Index LA Vol (A2C):   31.5 ml 19.38 ml/m LA Vol (A4C):   25.3 ml 15.56 ml/m LA Biplane Vol: 30.0 ml 18.46 ml/m  AORTIC VALVE AV Area (Vmax):    1.85 cm AV Area (Vmean):   2.06 cm AV Area (VTI):     2.09 cm AV Vmax:           168.00 cm/s AV Vmean:          88.000 cm/s AV VTI:            0.214 m AV Peak Grad:      11.3 mmHg AV Mean Grad:      4.0 mmHg LVOT Vmax:         137.00 cm/s LVOT Vmean:        80.000 cm/s LVOT VTI:          0.197 m LVOT/AV VTI ratio: 0.92 MITRAL VALVE MV Area (PHT): 4.04 cm    SHUNTS MV Decel Time: 188 msec    Systemic VTI:  0.20 m MV E velocity: 65.10 cm/s  Systemic Diam: 1.70 cm MV A velocity: 88.10 cm/s MV  E/A ratio:  0.74 Sunit Tolia Electronically signed by Olinda Bertrand Signature Date/Time: 08/09/2023/12:44:00 PM    Final    US  Abdomen Limited RUQ (LIVER/GB) Result Date: 08/09/2023 CLINICAL DATA:  Liver failure EXAM: ULTRASOUND ABDOMEN LIMITED RIGHT UPPER QUADRANT COMPARISON:  None Available. FINDINGS: Gallbladder: Gallbladder is well distended with gallbladder sludge. No definitive stones are seen. Mild wall thickening of 4 mm is noted related to underlying ascites. Common bile duct: Diameter: 6.8 mm Liver: Nodularity is noted with increased echogenicity consistent with underlying cirrhosis. Portal vein is patent on color Doppler imaging with normal direction of blood flow towards the liver. Other: Mild ascites is noted. IMPRESSION: Changes of hepatic cirrhosis with associated ascites. Gallbladder sludge without evidence of cholelithiasis. Electronically Signed   By: Violeta Grey M.D.   On: 08/09/2023 10:44   DG Chest Port 1 View Result Date: 08/08/2023 CLINICAL DATA:  Questionable sepsis - evaluate for abnormality EXAM: PORTABLE CHEST 1 VIEW COMPARISON:  None Available. FINDINGS: Overlying artifact limits  assessment. Endotracheal tube tip is 4.4 cm from the carina. Tip of the enteric tube is below the diaphragm in the stomach, side-port just beyond the gastroesophageal junction. The heart is normal in size. Mediastinal contours are normal. There may be small pleural effusions. No pulmonary edema, confluent airspace disease or pneumothorax. IMPRESSION: 1. Endotracheal tube tip 4.4 cm from the carina. 2. Enteric tube tip below the diaphragm in the stomach, side-port just beyond the gastroesophageal junction. 3. Possible small pleural effusions. Electronically Signed   By: Chadwick Colonel M.D.   On: 08/08/2023 18:33    Microbiology: No results found for this or any previous visit (from the past 240 hours).   Labs: Basic Metabolic Panel: Recent Labs  Lab 08/27/23 0101 08/27/23 9811 08/28/23 0034 08/28/23 1223 08/29/23 0220 08/29/23 0324 08/29/23 1228 08/30/23 0814 08/30/23 0938 08/30/23 1749 08/31/23 0545  NA 138 137 139  --  140  --   --  139  --   --  143  K 3.7 3.6 2.4*  --  2.5*  --    < > 3.3*  --   --  2.8*  CL 109 109 104  --  105  --   --  108  --   --  111  CO2 16* 18* 22  --  22  --   --  21*  --   --  20*  GLUCOSE 259* 255* 151*  --  121*  --   --  133*  --   --  118*  BUN 37* 39* 37*  --  36*  --   --  28*  --   --  29*  CREATININE 1.32* 1.12 1.27*  --  1.19  --   --  1.17  --   --  1.25*  CALCIUM 7.3* 7.3* 7.8*  --  7.7*  --   --  7.8*  --   --  8.5*  MG 1.5*  --   --  1.5*  --  1.8  --   --  1.9 2.0 1.9  PHOS 3.9  --   --   --   --  3.0  --   --  2.5 2.3* 2.0*   < > = values in this interval not displayed.   Liver Function Tests: Recent Labs  Lab 08/27/23 0856 08/28/23 0034 08/29/23 0220 08/30/23 0814 08/31/23 0545  AST 270* 152* 131* 135* 107*  ALT 150* 96* 92* 87* 68*  ALKPHOS 50 44  61 63 74  BILITOT 14.7* 14.8* 16.2* 17.5* 16.8*  PROT 3.7* 4.5* 4.6* 4.6* 5.0*  ALBUMIN  1.6* 2.9* 2.5* 2.6* 3.0*   No results for input(s): "LIPASE", "AMYLASE" in the last 168  hours. Recent Labs  Lab 08/31/23 0545  AMMONIA 77*   CBC: Recent Labs  Lab 08/28/23 1748 08/29/23 0220 08/29/23 1418 08/30/23 1034 08/30/23 2041 08/31/23 0545 08/31/23 1238  WBC 11.2* 12.4* 12.1* 7.3 8.4 9.0 10.3  NEUTROABS 9.5* 10.5*  --   --   --   --   --   HGB 8.5* 8.4* 8.8* 7.4* 7.1* 6.7* 7.0*  HCT 23.9* 23.0* 25.3* 21.8* 21.4* 19.4* 20.6*  MCV 86.9 86.5 89.7 92.0 93.4 92.8 92.4  PLT 93* 116* 112* 60* 55* 52* 49*   Cardiac Enzymes: No results for input(s): "CKTOTAL", "CKMB", "CKMBINDEX", "TROPONINI" in the last 168 hours. D-Dimer No results for input(s): "DDIMER" in the last 72 hours. BNP: Invalid input(s): "POCBNP" CBG: Recent Labs  Lab 08/30/23 2027 08/31/23 0005 08/31/23 0359 08/31/23 0725 08/31/23 1230  GLUCAP 154* 145* 129* 137* 150*   Anemia work up No results for input(s): "VITAMINB12", "FOLATE", "FERRITIN", "TIBC", "IRON", "RETICCTPCT" in the last 72 hours. Urinalysis    Component Value Date/Time   COLORURINE AMBER (A) 08/24/2023 0829   APPEARANCEUR CLEAR 08/24/2023 0829   LABSPEC >1.046 (H) 08/24/2023 0829   PHURINE 6.0 08/24/2023 0829   GLUCOSEU NEGATIVE 08/24/2023 0829   HGBUR NEGATIVE 08/24/2023 0829   BILIRUBINUR SMALL (A) 08/24/2023 0829   KETONESUR NEGATIVE 08/24/2023 0829   PROTEINUR NEGATIVE 08/24/2023 0829   NITRITE NEGATIVE 08/24/2023 0829   LEUKOCYTESUR NEGATIVE 08/24/2023 0829   Sepsis Labs Recent Labs  Lab 08/30/23 1034 08/30/23 2041 08/31/23 0545 08/31/23 1238  WBC 7.3 8.4 9.0 10.3       SIGNED:  Audria Leather, MD  [facility] 08/28/2023, 10:34 AM

## 2023-09-18 NOTE — Progress Notes (Signed)
 Wasted 60 mL of IV morphine with [place] RN.

## 2023-09-18 NOTE — Plan of Care (Signed)
 Pt is now on comfort care Problem: Education: Goal: Knowledge of General Education information will improve Description: Including pain rating scale, medication(s)/side effects and non-pharmacologic comfort measures Outcome: Not Met (add Reason)   Problem: Health Behavior/Discharge Planning: Goal: Ability to manage health-related needs will improve Outcome: Not Met (add Reason)   Problem: Clinical Measurements: Goal: Ability to maintain clinical measurements within normal limits will improve Outcome: Not Met (add Reason) Goal: Will remain free from infection Outcome: Not Met (add Reason) Goal: Diagnostic test results will improve Outcome: Not Met (add Reason) Goal: Respiratory complications will improve Outcome: Not Met (add Reason) Goal: Cardiovascular complication will be avoided Outcome: Not Met (add Reason)   Problem: Nutrition: Goal: Adequate nutrition will be maintained Outcome: Not Met (add Reason)   Problem: Activity: Goal: Risk for activity intolerance will decrease Outcome: Not Met (add Reason)   Problem: Coping: Goal: Level of anxiety will decrease Outcome: Not Met (add Reason)

## 2023-09-18 DEATH — deceased
